# Patient Record
Sex: Female | Born: 1960 | Race: Black or African American | Hispanic: No | Marital: Single | Smoking: Never smoker
Health system: Southern US, Community
[De-identification: ages and names within clinical notes are randomized; demographics above are authoritative.]

---

## 2017-02-16 ENCOUNTER — Encounter (HOSPITAL_COMMUNITY): Payer: Self-pay | Admitting: Emergency Medicine

## 2017-02-16 ENCOUNTER — Inpatient Hospital Stay (HOSPITAL_COMMUNITY)
Admission: EM | Admit: 2017-02-16 | Discharge: 2017-03-13 | DRG: 377 | Disposition: A | Discharge status: Hospice | Payer: Self-pay | Attending: Pulmonary Disease | Admitting: Pulmonary Disease

## 2017-02-16 ENCOUNTER — Emergency Department (HOSPITAL_COMMUNITY): Payer: Self-pay

## 2017-02-16 DIAGNOSIS — Z66 Do not resuscitate: Secondary | ICD-10-CM | POA: Diagnosis present

## 2017-02-16 DIAGNOSIS — D259 Leiomyoma of uterus, unspecified: Secondary | ICD-10-CM | POA: Diagnosis present

## 2017-02-16 DIAGNOSIS — R05 Cough: Secondary | ICD-10-CM

## 2017-02-16 DIAGNOSIS — S065X9A Traumatic subdural hemorrhage with loss of consciousness of unspecified duration, initial encounter: Secondary | ICD-10-CM

## 2017-02-16 DIAGNOSIS — I130 Hypertensive heart and chronic kidney disease with heart failure and stage 1 through stage 4 chronic kidney disease, or unspecified chronic kidney disease: Secondary | ICD-10-CM | POA: Diagnosis present

## 2017-02-16 DIAGNOSIS — L8990 Pressure ulcer of unspecified site, unspecified stage: Secondary | ICD-10-CM | POA: Insufficient documentation

## 2017-02-16 DIAGNOSIS — T45515A Adverse effect of anticoagulants, initial encounter: Secondary | ICD-10-CM | POA: Diagnosis not present

## 2017-02-16 DIAGNOSIS — M25561 Pain in right knee: Secondary | ICD-10-CM

## 2017-02-16 DIAGNOSIS — D649 Anemia, unspecified: Secondary | ICD-10-CM

## 2017-02-16 DIAGNOSIS — R040 Epistaxis: Secondary | ICD-10-CM | POA: Diagnosis not present

## 2017-02-16 DIAGNOSIS — Z8249 Family history of ischemic heart disease and other diseases of the circulatory system: Secondary | ICD-10-CM

## 2017-02-16 DIAGNOSIS — K760 Fatty (change of) liver, not elsewhere classified: Secondary | ICD-10-CM | POA: Diagnosis present

## 2017-02-16 DIAGNOSIS — R7303 Prediabetes: Secondary | ICD-10-CM | POA: Diagnosis present

## 2017-02-16 DIAGNOSIS — R571 Hypovolemic shock: Secondary | ICD-10-CM | POA: Diagnosis present

## 2017-02-16 DIAGNOSIS — D62 Acute posthemorrhagic anemia: Secondary | ICD-10-CM | POA: Diagnosis present

## 2017-02-16 DIAGNOSIS — J9601 Acute respiratory failure with hypoxia: Secondary | ICD-10-CM | POA: Diagnosis present

## 2017-02-16 DIAGNOSIS — R531 Weakness: Secondary | ICD-10-CM

## 2017-02-16 DIAGNOSIS — I5041 Acute combined systolic (congestive) and diastolic (congestive) heart failure: Secondary | ICD-10-CM | POA: Diagnosis present

## 2017-02-16 DIAGNOSIS — E876 Hypokalemia: Secondary | ICD-10-CM | POA: Diagnosis not present

## 2017-02-16 DIAGNOSIS — I2781 Cor pulmonale (chronic): Secondary | ICD-10-CM | POA: Diagnosis present

## 2017-02-16 DIAGNOSIS — Z452 Encounter for adjustment and management of vascular access device: Secondary | ICD-10-CM

## 2017-02-16 DIAGNOSIS — I5043 Acute on chronic combined systolic (congestive) and diastolic (congestive) heart failure: Secondary | ICD-10-CM | POA: Diagnosis present

## 2017-02-16 DIAGNOSIS — N183 Chronic kidney disease, stage 3 (moderate): Secondary | ICD-10-CM | POA: Diagnosis present

## 2017-02-16 DIAGNOSIS — L89322 Pressure ulcer of left buttock, stage 2: Secondary | ICD-10-CM | POA: Diagnosis present

## 2017-02-16 DIAGNOSIS — N390 Urinary tract infection, site not specified: Secondary | ICD-10-CM | POA: Diagnosis not present

## 2017-02-16 DIAGNOSIS — L03312 Cellulitis of back [any part except buttock]: Secondary | ICD-10-CM | POA: Diagnosis present

## 2017-02-16 DIAGNOSIS — K210 Gastro-esophageal reflux disease with esophagitis: Secondary | ICD-10-CM | POA: Diagnosis present

## 2017-02-16 DIAGNOSIS — G935 Compression of brain: Secondary | ICD-10-CM | POA: Diagnosis not present

## 2017-02-16 DIAGNOSIS — K449 Diaphragmatic hernia without obstruction or gangrene: Secondary | ICD-10-CM | POA: Diagnosis present

## 2017-02-16 DIAGNOSIS — G4733 Obstructive sleep apnea (adult) (pediatric): Secondary | ICD-10-CM | POA: Diagnosis present

## 2017-02-16 DIAGNOSIS — Z6841 Body Mass Index (BMI) 40.0 and over, adult: Secondary | ICD-10-CM

## 2017-02-16 DIAGNOSIS — Z7189 Other specified counseling: Secondary | ICD-10-CM

## 2017-02-16 DIAGNOSIS — R570 Cardiogenic shock: Secondary | ICD-10-CM | POA: Diagnosis present

## 2017-02-16 DIAGNOSIS — N179 Acute kidney failure, unspecified: Secondary | ICD-10-CM

## 2017-02-16 DIAGNOSIS — L03115 Cellulitis of right lower limb: Secondary | ICD-10-CM | POA: Diagnosis present

## 2017-02-16 DIAGNOSIS — Z833 Family history of diabetes mellitus: Secondary | ICD-10-CM

## 2017-02-16 DIAGNOSIS — Z515 Encounter for palliative care: Secondary | ICD-10-CM | POA: Diagnosis not present

## 2017-02-16 DIAGNOSIS — E872 Acidosis: Secondary | ICD-10-CM | POA: Diagnosis present

## 2017-02-16 DIAGNOSIS — K922 Gastrointestinal hemorrhage, unspecified: Secondary | ICD-10-CM | POA: Diagnosis present

## 2017-02-16 DIAGNOSIS — R058 Other specified cough: Secondary | ICD-10-CM

## 2017-02-16 DIAGNOSIS — D72829 Elevated white blood cell count, unspecified: Secondary | ICD-10-CM

## 2017-02-16 DIAGNOSIS — R188 Other ascites: Secondary | ICD-10-CM

## 2017-02-16 DIAGNOSIS — J9600 Acute respiratory failure, unspecified whether with hypoxia or hypercapnia: Secondary | ICD-10-CM

## 2017-02-16 DIAGNOSIS — R4701 Aphasia: Secondary | ICD-10-CM | POA: Diagnosis not present

## 2017-02-16 DIAGNOSIS — E860 Dehydration: Secondary | ICD-10-CM | POA: Diagnosis present

## 2017-02-16 DIAGNOSIS — R519 Headache, unspecified: Secondary | ICD-10-CM

## 2017-02-16 DIAGNOSIS — D509 Iron deficiency anemia, unspecified: Secondary | ICD-10-CM | POA: Diagnosis present

## 2017-02-16 DIAGNOSIS — R4182 Altered mental status, unspecified: Secondary | ICD-10-CM

## 2017-02-16 DIAGNOSIS — K567 Ileus, unspecified: Secondary | ICD-10-CM | POA: Diagnosis not present

## 2017-02-16 DIAGNOSIS — W19XXXA Unspecified fall, initial encounter: Secondary | ICD-10-CM

## 2017-02-16 DIAGNOSIS — I4891 Unspecified atrial fibrillation: Secondary | ICD-10-CM | POA: Diagnosis present

## 2017-02-16 DIAGNOSIS — R739 Hyperglycemia, unspecified: Secondary | ICD-10-CM | POA: Diagnosis present

## 2017-02-16 DIAGNOSIS — R059 Cough, unspecified: Secondary | ICD-10-CM

## 2017-02-16 DIAGNOSIS — R51 Headache: Secondary | ICD-10-CM

## 2017-02-16 DIAGNOSIS — I429 Cardiomyopathy, unspecified: Secondary | ICD-10-CM | POA: Diagnosis present

## 2017-02-16 DIAGNOSIS — I6201 Nontraumatic acute subdural hemorrhage: Secondary | ICD-10-CM | POA: Diagnosis not present

## 2017-02-16 DIAGNOSIS — K648 Other hemorrhoids: Secondary | ICD-10-CM | POA: Diagnosis present

## 2017-02-16 DIAGNOSIS — I484 Atypical atrial flutter: Secondary | ICD-10-CM | POA: Diagnosis present

## 2017-02-16 DIAGNOSIS — E43 Unspecified severe protein-calorie malnutrition: Secondary | ICD-10-CM | POA: Diagnosis present

## 2017-02-16 DIAGNOSIS — Z79899 Other long term (current) drug therapy: Secondary | ICD-10-CM

## 2017-02-16 DIAGNOSIS — Y92009 Unspecified place in unspecified non-institutional (private) residence as the place of occurrence of the external cause: Secondary | ICD-10-CM

## 2017-02-16 DIAGNOSIS — S065XAA Traumatic subdural hemorrhage with loss of consciousness status unknown, initial encounter: Secondary | ICD-10-CM

## 2017-02-16 DIAGNOSIS — K2971 Gastritis, unspecified, with bleeding: Principal | ICD-10-CM | POA: Diagnosis present

## 2017-02-16 DIAGNOSIS — E8770 Fluid overload, unspecified: Secondary | ICD-10-CM | POA: Diagnosis present

## 2017-02-16 DIAGNOSIS — E039 Hypothyroidism, unspecified: Secondary | ICD-10-CM | POA: Diagnosis present

## 2017-02-16 DIAGNOSIS — Z9289 Personal history of other medical treatment: Secondary | ICD-10-CM

## 2017-02-16 DIAGNOSIS — N170 Acute kidney failure with tubular necrosis: Secondary | ICD-10-CM | POA: Diagnosis present

## 2017-02-16 DIAGNOSIS — I5082 Biventricular heart failure: Secondary | ICD-10-CM | POA: Diagnosis present

## 2017-02-16 DIAGNOSIS — I509 Heart failure, unspecified: Secondary | ICD-10-CM

## 2017-02-16 DIAGNOSIS — L89312 Pressure ulcer of right buttock, stage 2: Secondary | ICD-10-CM | POA: Diagnosis present

## 2017-02-16 LAB — CBC WITH DIFFERENTIAL/PLATELET
Basophils Absolute: 0 10*3/uL (ref 0.0–0.1)
Basophils Relative: 0 %
Eosinophils Absolute: 0 10*3/uL (ref 0.0–0.7)
Eosinophils Relative: 0 %
HCT: 14.2 % — ABNORMAL LOW (ref 36.0–46.0)
Hemoglobin: 3.7 g/dL — CL (ref 12.0–15.0)
Lymphocytes Relative: 8 %
Lymphs Abs: 1.5 10*3/uL (ref 0.7–4.0)
MCH: 15.9 pg — ABNORMAL LOW (ref 26.0–34.0)
MCHC: 26.1 g/dL — ABNORMAL LOW (ref 30.0–36.0)
MCV: 60.9 fL — ABNORMAL LOW (ref 78.0–100.0)
Monocytes Absolute: 1 10*3/uL (ref 0.1–1.0)
Monocytes Relative: 5 %
Neutro Abs: 16.6 10*3/uL — ABNORMAL HIGH (ref 1.7–7.7)
Neutrophils Relative %: 87 %
Platelets: 288 10*3/uL (ref 150–400)
RBC: 2.33 MIL/uL — ABNORMAL LOW (ref 3.87–5.11)
RDW: 25.9 % — ABNORMAL HIGH (ref 11.5–15.5)
WBC: 19.1 10*3/uL — ABNORMAL HIGH (ref 4.0–10.5)

## 2017-02-16 LAB — COMPREHENSIVE METABOLIC PANEL
ALT: 22 U/L (ref 14–54)
AST: 30 U/L (ref 15–41)
Albumin: 2.8 g/dL — ABNORMAL LOW (ref 3.5–5.0)
Alkaline Phosphatase: 105 U/L (ref 38–126)
Anion gap: 11 (ref 5–15)
BUN: 52 mg/dL — ABNORMAL HIGH (ref 6–20)
CO2: 17 mmol/L — ABNORMAL LOW (ref 22–32)
Calcium: 9.1 mg/dL (ref 8.9–10.3)
Chloride: 112 mmol/L — ABNORMAL HIGH (ref 101–111)
Creatinine, Ser: 1.96 mg/dL — ABNORMAL HIGH (ref 0.44–1.00)
GFR calc Af Amer: 32 mL/min — ABNORMAL LOW (ref >60)
GFR calc non Af Amer: 28 mL/min — ABNORMAL LOW (ref >60)
Glucose, Bld: 119 mg/dL — ABNORMAL HIGH (ref 65–99)
Potassium: 4.2 mmol/L (ref 3.5–5.1)
Sodium: 140 mmol/L (ref 135–145)
Total Bilirubin: 2.9 mg/dL — ABNORMAL HIGH (ref 0.3–1.2)
Total Protein: 6.2 g/dL — ABNORMAL LOW (ref 6.5–8.1)

## 2017-02-16 LAB — CBC
HCT: 14.6 % — ABNORMAL LOW (ref 36.0–46.0)
Hemoglobin: 3.8 g/dL — CL (ref 12.0–15.0)
MCH: 15.7 pg — ABNORMAL LOW (ref 26.0–34.0)
MCHC: 26 g/dL — ABNORMAL LOW (ref 30.0–36.0)
MCV: 60.3 fL — ABNORMAL LOW (ref 78.0–100.0)
PLATELETS: 300 10*3/uL (ref 150–400)
RBC: 2.42 MIL/uL — ABNORMAL LOW (ref 3.87–5.11)
RDW: 26 % — AB (ref 11.5–15.5)
WBC: 18.9 10*3/uL — AB (ref 4.0–10.5)

## 2017-02-16 LAB — MRSA PCR SCREENING: MRSA BY PCR: NEGATIVE

## 2017-02-16 LAB — POC OCCULT BLOOD, ED: Fecal Occult Bld: NEGATIVE

## 2017-02-16 LAB — APTT: aPTT: 35 seconds (ref 24–36)

## 2017-02-16 LAB — URINALYSIS, ROUTINE W REFLEX MICROSCOPIC
Bilirubin Urine: NEGATIVE
Glucose, UA: NEGATIVE mg/dL
Hgb urine dipstick: NEGATIVE
Ketones, ur: NEGATIVE mg/dL
Leukocytes, UA: NEGATIVE
Nitrite: NEGATIVE
Protein, ur: NEGATIVE mg/dL
Specific Gravity, Urine: 1.013 (ref 1.005–1.030)
pH: 5 (ref 5.0–8.0)

## 2017-02-16 LAB — CBG MONITORING, ED: Glucose-Capillary: 129 mg/dL — ABNORMAL HIGH (ref 65–99)

## 2017-02-16 LAB — ABO/RH: ABO/RH(D): O POS

## 2017-02-16 LAB — PREPARE RBC (CROSSMATCH)

## 2017-02-16 LAB — PREGNANCY, URINE: Preg Test, Ur: NEGATIVE

## 2017-02-16 LAB — GLUCOSE, CAPILLARY: GLUCOSE-CAPILLARY: 142 mg/dL — AB (ref 65–99)

## 2017-02-16 LAB — PROTIME-INR
INR: 1.4
Prothrombin Time: 17.2 seconds — ABNORMAL HIGH (ref 11.4–15.2)

## 2017-02-16 LAB — BRAIN NATRIURETIC PEPTIDE: B NATRIURETIC PEPTIDE 5: 923.6 pg/mL — AB (ref 0.0–100.0)

## 2017-02-16 LAB — I-STAT CG4 LACTIC ACID, ED: Lactic Acid, Venous: 2.46 mmol/L (ref 0.5–1.9)

## 2017-02-16 MED ORDER — SODIUM CHLORIDE 0.9 % IV BOLUS (SEPSIS): 1000.0000 mL | Freq: Once | INTRAVENOUS | Status: DC

## 2017-02-16 MED ORDER — SODIUM CHLORIDE 0.9 % IV BOLUS (SEPSIS): 500.0000 mL | Freq: Once | INTRAVENOUS | Status: DC

## 2017-02-16 MED ORDER — SODIUM CHLORIDE 0.9 % IV BOLUS (SEPSIS)
1000.0000 mL | Freq: Once | INTRAVENOUS | Status: AC
  Administered 2017-02-16: 1000 mL via INTRAVENOUS

## 2017-02-16 MED ORDER — SODIUM CHLORIDE 0.9 % IV SOLN: 1000.0000 mL | INTRAVENOUS | Status: DC

## 2017-02-16 MED ORDER — SODIUM CHLORIDE 0.9 % IV SOLN: Freq: Once | INTRAVENOUS | Status: DC

## 2017-02-16 MED ORDER — SODIUM CHLORIDE 0.9 % IV SOLN
250.0000 mL | INTRAVENOUS | Status: DC | PRN
  Administered 2017-03-09: 250 mL via INTRAVENOUS
  Administered 2017-03-11: 20 mL via INTRAVENOUS

## 2017-02-16 MED ORDER — ONDANSETRON HCL 4 MG/2ML IJ SOLN
4.0000 mg | Freq: Once | INTRAMUSCULAR | Status: AC
  Administered 2017-02-16: 4 mg via INTRAVENOUS
  Filled 2017-02-16 (×2): qty 2

## 2017-02-16 MED ORDER — FUROSEMIDE 10 MG/ML IJ SOLN: 40.0000 mg | Freq: Once | INTRAMUSCULAR | Status: DC

## 2017-02-16 MED ORDER — DEXTROSE 5 % IV SOLN
1.0000 g | Freq: Once | INTRAVENOUS | Status: AC
  Administered 2017-02-16: 1 g via INTRAVENOUS
  Filled 2017-02-16: qty 10

## 2017-02-16 MED ORDER — VANCOMYCIN HCL IN DEXTROSE 1-5 GM/200ML-% IV SOLN
1000.0000 mg | Freq: Once | INTRAVENOUS | Status: AC
  Administered 2017-02-16: 1000 mg via INTRAVENOUS
  Filled 2017-02-16: qty 200

## 2017-02-16 NOTE — ED Notes (Signed)
Pulse checks were completed by Dr. Vallery Ridge and [place]

## 2017-02-16 NOTE — ED Notes (Signed)
Unable to draw Lactic Acid repeat due to pt. Receiving blood at this time.

## 2017-02-16 NOTE — ED Triage Notes (Signed)
Patient here from home with complaints of weakness and pain all over. Incontinent of urine and bowel. Reports that she has not had anything to eat or drink in 3 days. States that he has been laying in the same spot for 3 days also. Sores all over.

## 2017-02-16 NOTE — H&P (Signed)
PULMONARY / CRITICAL CARE MEDICINE   Name: Samantha Richards MRN: [id number] DOB: 04-17-1961    ADMISSION DATE:  02/16/2017 CONSULTATION DATE:  02/16/2017  REFERRING MD:  Nils Flack, PA   CHIEF COMPLAINT:  Anemia   HISTORY OF PRESENT ILLNESS:   56 year old female with PMH of Obesity presents to ED on 6/24 with complaints of weakness and pain for the last 3 days. 2 weeks prior had a mechanical fall which resulted in right knee pain. Patient reports that she has not had anything to eat or drink as she has been laying in bed for 3 days unable to move, reports she lives with a roommate however they refused to help. EMS arrived to house on 6/21 and found patient laying face down on an air-mattress, however refused help. EMS reports that living situation was poor and house was in un-livable conditions.   Upon arrival to ED patient patient has multiple locations of skin breakdown along the buttocks, thighs, and legs. WBC 19.1, Hemoglobin 3.7, afebrile, BP 80/30. Rectal exam shows internal hemorrhoids, however is hemoccult negative. PCCM asked to admit.    PAST MEDICAL HISTORY :  Obesity   PAST SURGICAL HISTORY: She  has no past surgical history on file.  Allergies  Allergen Reactions  . Gentamicin Itching and Rash    No current facility-administered medications on file prior to encounter.    No current outpatient prescriptions on file prior to encounter.    FAMILY HISTORY:  Her has no family status information on file.    SOCIAL HISTORY: Lives in a house with roommate, poor housing conditions   REVIEW OF SYSTEMS:   All negative; except for those that are bolded, which indicate positives.  Constitutional: weight loss, weight gain, night sweats, fevers, chills, fatigue, weakness.  HEENT: headaches, sore throat, sneezing, nasal congestion, post nasal drip, difficulty swallowing, tooth/dental problems, visual complaints, visual changes, ear aches. Neuro: difficulty with speech, weakness,  numbness, ataxia. CV:  chest pain, orthopnea, PND, swelling in lower extremities, dizziness, palpitations, syncope.  Resp: cough, hemoptysis, dyspnea, wheezing. GI: heartburn, indigestion, abdominal pain, nausea, vomiting, diarrhea, constipation, change in bowel habits, loss of appetite, hematemesis, melena, hematochezia.  GU: dysuria, change in color of urine, urgency or frequency, flank pain, hematuria. MSK: joint pain or swelling, decreased range of motion. Psych: change in mood or affect, depression, anxiety, suicidal ideations, homicidal ideations. Skin: rash, itching, bruising.   SUBJECTIVE:  Denies pain, states swelling to lower extremities has worsened over the last few days   VITAL SIGNS: BP (!) 84/69   Pulse (!) 141   Temp 99 F (37.2 C)   Resp 20   LMP 01/16/2017   SpO2 100%   HEMODYNAMICS:    VENTILATOR SETTINGS:    INTAKE / OUTPUT: I/O last 3 completed shifts: In: 50 [Blood:335] Out: -   PHYSICAL EXAMINATION: General:  Adult female, mild respiratory distress  Neuro:  Alert, oriented, grossly intact  HEENT:  Dry MM Cardiovascular:  Tachy, no MRG Lungs:  Bibasilar Crackles, no wheeze, mild tachypnea   Abdomen:  Obese, active bowel sounds, non-tender  Musculoskeletal:  Swelling to lower extremities, warm to touch  Skin:  Dry, multiple locations of skin breakdown along the buttocks, thighs, and legs  LABS:  BMET  Recent Labs Lab 02/16/17 1421  NA 140  K 4.2  CL 112*  CO2 17*  BUN 52*  CREATININE 1.96*  GLUCOSE 119*    Electrolytes  Recent Labs Lab 02/16/17 1421  CALCIUM 9.1  CBC  Recent Labs Lab 02/16/17 1421 02/16/17 1516  WBC 19.1* 18.9*  HGB 3.7* 3.8*  HCT 14.2* 14.6*  PLT 288 300    Coag's  Recent Labs Lab 02/16/17 1421  APTT 35  INR 1.40    Sepsis Markers  Recent Labs Lab 02/16/17 1439  LATICACIDVEN 2.46*    ABG No results for input(s): PHART, PCO2ART, PO2ART in the last 168 hours.  Liver  Enzymes  Recent Labs Lab 02/16/17 1421  AST 30  ALT 22  ALKPHOS 105  BILITOT 2.9*  ALBUMIN 2.8*    Cardiac Enzymes No results for input(s): TROPONINI, PROBNP in the last 168 hours.  Glucose  Recent Labs Lab 02/16/17 1516  GLUCAP 129*    Imaging Dg Chest Port 1 View  Result Date: 02/16/2017 CLINICAL DATA:  Shortness of breath, possible fall 2 weeks ago. EXAM: PORTABLE CHEST 1 VIEW COMPARISON:  None. FINDINGS: Cardiomegaly, moderate two severe. Upper mediastinal contours, including aortic arch, difficult to characterize due to patient positioning. Central pulmonary vascular congestion and probable mild perihilar edema. No pneumothorax seen. No acute or suspicious osseous finding. IMPRESSION: 1. Cardiomegaly, moderate to severe degree. 2. Central pulmonary vascular congestion and probable mild perihilar interstitial edema suggesting mild CHF/volume overload. Electronically Signed   By: Franki Cabot M.D.   On: 02/16/2017 14:55   Dg Knee Complete 4 Views Right  Result Date: 02/16/2017 CLINICAL DATA:  Right knee pain, possible fall 2 weeks ago. EXAM: RIGHT KNEE - COMPLETE 4+ VIEW COMPARISON:  None. FINDINGS: No evidence of fracture, dislocation, or joint effusion. No evidence of arthropathy or other focal bone abnormality. Soft tissues are unremarkable. IMPRESSION: Negative. Electronically Signed   By: Franki Cabot M.D.   On: 02/16/2017 14:53     STUDIES:  CXR 6/24 > Cardiomegaly, moderate to severe degree, central vascular pulmonary congestion and probable mild perihilar interstitial edema suggesting mild CHF/volume overload  XR right Knee 6/24 > No Acute ECHO 6/24 >>  CULTURES: Blood 6/24 >>  ANTIBIOTICS: Vancomycin 6/24 in ED  Rocephin 6/24 >>  SIGNIFICANT EVENTS: 6/24 > Presents to ED   LINES/TUBES: PIV   DISCUSSION: 56 year old female presents to ED with progressive weakness, found to have hemoglobin of 3.7. Rectal exam revealed internal hemorrhoids with negative  hemoccult test. Systolic 96-28Z, CXR with pulmonary edema and cardiomegaly. GI consulted.   ASSESSMENT / PLAN:  PULMONARY A: Acute Hypoxic Respiratory Failure in setting of pulmonary edema  P:   BIPAP PRN Maintain oxygenation >92 Pulmonary Hygiene  Trend CXR   CARDIOVASCULAR A:  Hemorraghic Shock > no current signs of bleeding, believe this to be a slow bleed over the last few days Acute Heart Failure ? P:  ICU Monitoring  Maintain MAP > 65 (currently receiving RBCs)  RENAL A:   Acute Kidney Injury  Volume Overload  Lactic Acidosis  P:   Trend BMP Give 40 meq Lasix  Replace electrolytes as needed  Trend Lactic Acid  Trend CK   GASTROINTESTINAL A:   GI Bleed > internal hemorrhoids present  P:   GI consulted will see in AM Remain NPO PPI  HEMATOLOGIC A:   Anemia with acute bleed  P:  Trend CBC Maintain Hbg >7  INFECTIOUS A:   ?Cellulitis  P:   Trend WBC and Fever Curve  Continue Rocephin  Follow Culture Data   ENDOCRINE A:   No issues    P:   Trend Glucose   NEUROLOGIC A:   No issues  P:   Monitor    FAMILY  - Updates: no family presents   - Inter-disciplinary family meet or Palliative Care meeting due by:  02/23/2017    Pulmonary and [place] Pager: [phone]  02/16/2017, 7:54 PM

## 2017-02-16 NOTE — ED Triage Notes (Addendum)
Pt from home via EMS- Per pt, she sts that she has "been in the bed for 3 days without getting up". EMS reports that pt house conditions were "not livable". EMS reports that pt was lying in urine/feces in bed with stained sheets and clothes. Pt had a family member in the home, but reports that "he did not help me." Pt reports that she has had no food, fluids or meds.  Pt is A&O and in NAD.

## 2017-02-16 NOTE — ED Provider Notes (Signed)
[facility] DEPT Provider Note   CSN: [id number] Arrival date & time: 02/16/17  1301     History   Chief Complaint Chief Complaint  Patient presents with  . Weakness    HPI Samantha Richards is a 56 y.o. female with no apparent medical history who presents today with chief complaint acute onset generalized weakness for 3 days. She states that 2 weeks ago she fell on her right side resulting in right knee pain. She denies hitting her head or losing consciousness at this time. She states that she has been ambulatory since this time, however with worsening knee pain and stiffness. Per nursing, EMS was called 3 days ago to the patient's home and found her laying face down on an air mattress. They were able to move her upright, and patient refused medical evaluation at that time. She called EMS today stating that she has been unable to move out of the bed for 3 days and has been laying prone towards her left side. She states that she has been urinating and having bowel movements in this bed due to an inability to move out of bed. She states that she has not had anything to eat or drink for 3 days, but does state she lives with a roommate "who did not feed me because he was mad at me". She also endorses subjective fevers and chills, generalized edema to her abdomen and lower extremities, as well as painful areas of blistering and skin breakdown to her posterior legs bilaterally. She endorses mid sternal chest tightness and shortness of breath, which she thinks is due to her laying prone for 3 days. She endorses continued right knee pain and stiffness. She denies numbness, tingling, syncope.   She states she has no medical history but has not seen a medical provider for years, is a nonsmoker, and denies hemoptysis, OCPs or estrogen therapy, or history of prior DVT or PE.  The history is provided by the patient.    History reviewed. No pertinent past medical history.  Patient Active Problem List   Diagnosis Date Noted  . GI bleed 02/16/2017    History reviewed. No pertinent surgical history.  OB History    No data available       Home Medications    Prior to Admission medications   Not on File    Family History No family history on file.  Social History Social History  Substance Use Topics  . Smoking status: Not on file  . Smokeless tobacco: Not on file  . Alcohol use Not on file     Allergies   Gentamicin   Review of Systems Review of Systems  Constitutional: Positive for activity change, chills, fatigue and fever.  Respiratory: Positive for cough, chest tightness and shortness of breath.   Cardiovascular: Positive for leg swelling. Negative for chest pain.  Gastrointestinal: Positive for abdominal pain.  Genitourinary:       Functional incontinence for 3 days  Musculoskeletal: Negative for back pain, neck pain and neck stiffness.  Neurological: Positive for weakness. Negative for syncope and numbness.  All other systems reviewed and are negative.    Physical Exam Updated Vital Signs BP (!) 73/62 (BP Location: Left Arm)   Pulse (!) 135   Temp 98.5 F (36.9 C) (Oral)   Resp 20   LMP 01/16/2017   SpO2 98%   Physical Exam  Constitutional: She is oriented to person, place, and time. She appears distressed.  Anxious appearing, dirty fingernails and toenails  HENT:  Head: Normocephalic and atraumatic.  Right Ear: External ear normal.  Left Ear: External ear normal.  Fruity smelling breath, dry oral mucosa  Eyes: Conjunctivae and EOM are normal. Pupils are equal, round, and reactive to light. Right eye exhibits no discharge. Left eye exhibits no discharge.  Neck: Normal range of motion. Neck supple. No JVD present. No tracheal deviation present.  Cardiovascular: Intact distal pulses.   Tachycardic, 4+ pitting edema to bilateral lower extremities. 2+ radial pulses topical bilaterally. Pedal pulses present on Doppler. Feet are pink warm and dry  bilaterally with good cap refill of the toes.  Pulmonary/Chest:  Hyperventilating, no adventitious sounds heard in the anterior lung fields. There is parasternal chest wall tenderness superiorly, however no lateral chest wall tenderness to palpation. Left breast is edematous compared to the right and nontender to palpation.   Abdominal: She exhibits distension. There is no tenderness. There is no guarding.  Hypoactive bowel sounds, no tenderness to palpation, however there is generalized edema to the abdomen, left worse than right  Genitourinary:  Genitourinary Comments: Examination performed in the presence of a chaperone. No frank bleeding, fissures, or tears noticed at the rectum. No masses or lesions. Internal hemorrhoid palpated. No surrounding erythema or drainage.  Musculoskeletal: She exhibits edema and tenderness.  Peripheral muscle wasting in all 4 extremities. Limited range of motion of BUE and BLE due to weakness, worse in the right lower extremity. 4/5 strength in BUE and BLE major muscle groups, save for the right hip flexors, right gastrocs, and BL deltoids which are 3/5. Good grip strength.   No midline spine TTP, no paraspinal muscle tenderness. No deformity, crepitus, or step-off noted.   Right knee tender to palpation along the lateral joint line and patella, limited range of motion, no erythema. There is apparent swelling. No varus or valgus deformity noted.   Neurological: She is alert and oriented to person, place, and time. No cranial nerve deficit or sensory deficit.  Skin: Skin is warm and dry.  See attached photos. There appears to be superficial skin breakdown along the posterior bilateral legs towards the buttocks and thighs. These areas are tender to palpation. There are also bulla seen to the right distal lower leg posteriorly and weeping sores to the left distal lower leg posteriorly. Right anterior thigh with mild erythema  Psychiatric: She has a normal mood and  affect. Her behavior is normal.           ED Treatments / Results  Labs (all labs ordered are listed, but only abnormal results are displayed) Labs Reviewed  CBC WITH DIFFERENTIAL/PLATELET - Abnormal; Notable for the following:       Result Value   WBC 19.1 (*)    RBC 2.33 (*)    Hemoglobin 3.7 (*)    HCT 14.2 (*)    MCV 60.9 (*)    MCH 15.9 (*)    MCHC 26.1 (*)    RDW 25.9 (*)    Neutro Abs 16.6 (*)    All other components within normal limits  COMPREHENSIVE METABOLIC PANEL - Abnormal; Notable for the following:    Chloride 112 (*)    CO2 17 (*)    Glucose, Bld 119 (*)    BUN 52 (*)    Creatinine, Ser 1.96 (*)    Total Protein 6.2 (*)    Albumin 2.8 (*)    Total Bilirubin 2.9 (*)    GFR calc non Af Amer 28 (*)    GFR  calc Af Amer 32 (*)    All other components within normal limits  PROTIME-INR - Abnormal; Notable for the following:    Prothrombin Time 17.2 (*)    All other components within normal limits  CBC - Abnormal; Notable for the following:    WBC 18.9 (*)    RBC 2.42 (*)    Hemoglobin 3.8 (*)    HCT 14.6 (*)    MCV 60.3 (*)    MCH 15.7 (*)    MCHC 26.0 (*)    RDW 26.0 (*)    All other components within normal limits  BRAIN NATRIURETIC PEPTIDE - Abnormal; Notable for the following:    B Natriuretic Peptide 923.6 (*)    All other components within normal limits  I-STAT CG4 LACTIC ACID, ED - Abnormal; Notable for the following:    Lactic Acid, Venous 2.46 (*)    All other components within normal limits  CBG MONITORING, ED - Abnormal; Notable for the following:    Glucose-Capillary 129 (*)    All other components within normal limits  CULTURE, BLOOD (ROUTINE X 2)  CULTURE, BLOOD (ROUTINE X 2)  APTT  URINALYSIS, ROUTINE W REFLEX MICROSCOPIC  PREGNANCY, URINE  CK  BASIC METABOLIC PANEL  MAGNESIUM  PHOSPHORUS  CBC  CBC  CBC  BASIC METABOLIC PANEL  TROPONIN I  TROPONIN I  TROPONIN I  POC OCCULT BLOOD, ED  I-STAT CG4 LACTIC ACID, ED  TYPE  AND SCREEN  PREPARE RBC (CROSSMATCH)  ABO/RH    EKG  EKG Interpretation  Date/Time:  Sunday February 16 2017 13:47:30 EDT Ventricular Rate:  176 PR Interval:    QRS Duration: 124 QT Interval:  336 QTC Calculation: 575 R Axis:   -44 Text Interpretation:  very low voltage. tachycardia.  possible atrial fibrillation. no ST elevation . no old comparison Confirmed by Charlesetta Shanks [id number]) on 02/16/2017 4:45:33 PM       Radiology Dg Chest Port 1 View  Result Date: 02/16/2017 CLINICAL DATA:  Shortness of breath, possible fall 2 weeks ago. EXAM: PORTABLE CHEST 1 VIEW COMPARISON:  None. FINDINGS: Cardiomegaly, moderate two severe. Upper mediastinal contours, including aortic arch, difficult to characterize due to patient positioning. Central pulmonary vascular congestion and probable mild perihilar edema. No pneumothorax seen. No acute or suspicious osseous finding. IMPRESSION: 1. Cardiomegaly, moderate to severe degree. 2. Central pulmonary vascular congestion and probable mild perihilar interstitial edema suggesting mild CHF/volume overload. Electronically Signed   By: Franki Cabot M.D.   On: 02/16/2017 14:55   Dg Knee Complete 4 Views Right  Result Date: 02/16/2017 CLINICAL DATA:  Right knee pain, possible fall 2 weeks ago. EXAM: RIGHT KNEE - COMPLETE 4+ VIEW COMPARISON:  None. FINDINGS: No evidence of fracture, dislocation, or joint effusion. No evidence of arthropathy or other focal bone abnormality. Soft tissues are unremarkable. IMPRESSION: Negative. Electronically Signed   By: Franki Cabot M.D.   On: 02/16/2017 14:53    Procedures Procedures (including critical care time)  Medications Ordered in ED Medications  sodium chloride 0.9 % bolus 1,000 mL (1,000 mLs Intravenous Not Given 02/16/17 1518)    Followed by  0.9 %  sodium chloride infusion (0 mLs Intravenous Hold 02/16/17 1518)  0.9 %  sodium chloride infusion ( Intravenous Not Given 02/16/17 1520)  0.9 %  sodium chloride infusion  (not administered)  furosemide (LASIX) injection 40 mg (not administered)  sodium chloride 0.9 % bolus 1,000 mL (0 mLs Intravenous Stopped 02/16/17 1517)  cefTRIAXone (ROCEPHIN) 1  g in dextrose 5 % 50 mL IVPB (0 g Intravenous Stopped 02/16/17 1517)  vancomycin (VANCOCIN) IVPB 1000 mg/200 mL premix (0 mg Intravenous Stopped 02/16/17 1702)  ondansetron (ZOFRAN) injection 4 mg (4 mg Intravenous Given 02/16/17 1632)     Initial Impression / Assessment and Plan / ED Course  I have reviewed the triage vital signs and the nursing notes.  Pertinent labs & imaging results that were available during my care of the patient were reviewed by me and considered in my medical decision making (see chart for details).     Patient with generalized weakness who spent 3 days prone in bed secondary to worsening right knee pain and associated generalized dependent edema, reproducible chest pain, and SOB. Hypotensive and tachycardic while in the ED. Lactate  2.46; initiated fluid resuscitation. Leukocytosis of 19.1 with hemoglobin of 3.7, confirmed with redraw. Hemoccult-negative, internal hemorrhoids palpated on examination. Decreased fluid resuscitation and initiated blood transfusion. EKG shows low voltage and tachycardia. CXR shows cardiomegaly(moderate to severe) and central pulmonary vascular congestion and probable mild perihilar interstitial edema suggesting mild CHF/volume overload. Moderate increase in BNP >900. Possible component of congestive heart failure, low suspicion of ACS or MI. Multiple areas of skin breakdown with possible concern for cellulitis, initiated broad-spectrum antibiotics. Spoke with intensivist, who agrees to assume care of patient will be brought into the hospital for further management and observation of her anemia and possible infection. Patient seen and evaluated by Dr. Johnney Killian who agrees with assessment and plan.  CRITICAL CARE Performed by: Renita Papa   Total critical care time:  >30 minutes  Critical care time was exclusive of separately billable procedures and treating other patients.  Critical care was necessary to treat or prevent imminent or life-threatening deterioration.  Critical care was time spent personally by me on the following activities: development of treatment plan with patient and/or surrogate as well as nursing, discussions with consultants, evaluation of patient's response to treatment, examination of patient, obtaining history from patient or surrogate, ordering and performing treatments and interventions, ordering and review of laboratory studies, ordering and review of radiographic studies, pulse oximetry and re-evaluation of patient's condition.  Final Clinical Impressions(s) / ED Diagnoses   Final diagnoses:  Fall    New Prescriptions New Prescriptions   No medications on file     Debroah Baller 02/16/17 2300    Charlesetta Shanks, MD 03/01/17 1520

## 2017-02-17 ENCOUNTER — Inpatient Hospital Stay (HOSPITAL_COMMUNITY): Payer: Self-pay

## 2017-02-17 ENCOUNTER — Encounter (HOSPITAL_COMMUNITY): Payer: Self-pay | Admitting: Radiology

## 2017-02-17 DIAGNOSIS — M79609 Pain in unspecified limb: Secondary | ICD-10-CM

## 2017-02-17 DIAGNOSIS — I360 Nonrheumatic tricuspid (valve) stenosis: Secondary | ICD-10-CM

## 2017-02-17 DIAGNOSIS — M7989 Other specified soft tissue disorders: Secondary | ICD-10-CM

## 2017-02-17 LAB — BLOOD CULTURE ID PANEL (REFLEXED)
Acinetobacter baumannii: NOT DETECTED
CANDIDA GLABRATA: NOT DETECTED
CANDIDA TROPICALIS: NOT DETECTED
Candida albicans: NOT DETECTED
Candida krusei: NOT DETECTED
Candida parapsilosis: NOT DETECTED
ENTEROBACTER CLOACAE COMPLEX: NOT DETECTED
ENTEROBACTERIACEAE SPECIES: NOT DETECTED
ENTEROCOCCUS SPECIES: NOT DETECTED
Escherichia coli: NOT DETECTED
HAEMOPHILUS INFLUENZAE: NOT DETECTED
Klebsiella oxytoca: NOT DETECTED
Klebsiella pneumoniae: NOT DETECTED
Listeria monocytogenes: NOT DETECTED
METHICILLIN RESISTANCE: DETECTED — AB
NEISSERIA MENINGITIDIS: NOT DETECTED
PROTEUS SPECIES: NOT DETECTED
Pseudomonas aeruginosa: NOT DETECTED
STAPHYLOCOCCUS SPECIES: DETECTED — AB
STREPTOCOCCUS AGALACTIAE: NOT DETECTED
STREPTOCOCCUS SPECIES: NOT DETECTED
Serratia marcescens: NOT DETECTED
Staphylococcus aureus (BCID): NOT DETECTED
Streptococcus pneumoniae: NOT DETECTED
Streptococcus pyogenes: NOT DETECTED

## 2017-02-17 LAB — GLUCOSE, CAPILLARY
GLUCOSE-CAPILLARY: 154 mg/dL — AB (ref 65–99)
Glucose-Capillary: 125 mg/dL — ABNORMAL HIGH (ref 65–99)
Glucose-Capillary: 147 mg/dL — ABNORMAL HIGH (ref 65–99)
Glucose-Capillary: 149 mg/dL — ABNORMAL HIGH (ref 65–99)
Glucose-Capillary: 156 mg/dL — ABNORMAL HIGH (ref 65–99)

## 2017-02-17 LAB — BASIC METABOLIC PANEL
Anion gap: 12 (ref 5–15)
Anion gap: 8 (ref 5–15)
BUN: 59 mg/dL — AB (ref 6–20)
BUN: 62 mg/dL — ABNORMAL HIGH (ref 6–20)
CHLORIDE: 113 mmol/L — AB (ref 101–111)
CHLORIDE: 114 mmol/L — AB (ref 101–111)
CO2: 17 mmol/L — ABNORMAL LOW (ref 22–32)
CO2: 18 mmol/L — ABNORMAL LOW (ref 22–32)
CREATININE: 2.05 mg/dL — AB (ref 0.44–1.00)
Calcium: 9 mg/dL (ref 8.9–10.3)
Calcium: 8.7 mg/dL — ABNORMAL LOW (ref 8.9–10.3)
Creatinine, Ser: 1.96 mg/dL — ABNORMAL HIGH (ref 0.44–1.00)
GFR calc Af Amer: 32 mL/min — ABNORMAL LOW (ref >60)
GFR calc non Af Amer: 28 mL/min — ABNORMAL LOW (ref >60)
GFR, EST AFRICAN AMERICAN: 31 mL/min — AB (ref >60)
GFR, EST NON AFRICAN AMERICAN: 27 mL/min — AB (ref >60)
GLUCOSE: 141 mg/dL — AB (ref 65–99)
Glucose, Bld: 161 mg/dL — ABNORMAL HIGH (ref 65–99)
POTASSIUM: 4.2 mmol/L (ref 3.5–5.1)
POTASSIUM: 4 mmol/L (ref 3.5–5.1)
SODIUM: 140 mmol/L (ref 135–145)
Sodium: 142 mmol/L (ref 135–145)

## 2017-02-17 LAB — ECHOCARDIOGRAM COMPLETE
AVLVOTPG: 14 mmHg
Ao-asc: 25 cm
CHL CUP MV DEC (S): 151
CHL CUP MV M VEL: 283
CHL CUP RV SYS PRESS: 27 mmHg
E/e' ratio: 10.89
EWDT: 151 ms
FS: 19 % — AB (ref 28–44)
Height: 65 in
IV/PV OW: 0.69
LA diam end sys: 38 mm
LA diam index: 1.67 cm/m2
LA vol A4C: 64.9 ml
LASIZE: 38 mm
LV TDI E'MEDIAL: 9.68
LV e' LATERAL: 10.1 cm/s
LV sys vol index: 29 mL/m2
LV sys vol: 66 mL — AB (ref 14–42)
LVDIAVOL: 106 mL (ref 46–106)
LVDIAVOLIN: 47 mL/m2
LVEEAVG: 10.89
LVEEMED: 10.89
LVOT VTI: 23.4 cm
LVOTPV: 185 cm/s
Lateral S' vel: 6.64 cm/s
MV Annulus VTI: 90.6 cm
MV Peak grad: 5 mmHg
MVG: 34 mmHg
MVPKEVEL: 110 m/s
PW: 14.3 mm — AB (ref 0.6–1.1)
Reg peak vel: 176 cm/s
Simpson's disk: 38
Stroke v: 40 ml
TAPSE: 9.66 mm
TDI e' lateral: 10.1
TR max vel: 176 cm/s
Weight: 3802.49 oz

## 2017-02-17 LAB — PHOSPHORUS: Phosphorus: 4.4 mg/dL (ref 2.5–4.6)

## 2017-02-17 LAB — CBC
HCT: 19.9 % — ABNORMAL LOW (ref 36.0–46.0)
HCT: 21.6 % — ABNORMAL LOW (ref 36.0–46.0)
HCT: 21.3 % — ABNORMAL LOW (ref 36.0–46.0)
HCT: 21.5 % — ABNORMAL LOW (ref 36.0–46.0)
HEMOGLOBIN: 6 g/dL — AB (ref 12.0–15.0)
Hemoglobin: 6.7 g/dL — CL (ref 12.0–15.0)
Hemoglobin: 6.4 g/dL — CL (ref 12.0–15.0)
Hemoglobin: 6.8 g/dL — CL (ref 12.0–15.0)
MCH: 21.1 pg — AB (ref 26.0–34.0)
MCH: 22.1 pg — AB (ref 26.0–34.0)
MCH: 21.1 pg — ABNORMAL LOW (ref 26.0–34.0)
MCH: 23 pg — AB (ref 26.0–34.0)
MCHC: 30.2 g/dL (ref 30.0–36.0)
MCHC: 31 g/dL (ref 30.0–36.0)
MCHC: 30 g/dL (ref 30.0–36.0)
MCHC: 31.6 g/dL (ref 30.0–36.0)
MCV: 69.8 fL — AB (ref 78.0–100.0)
MCV: 71.3 fL — AB (ref 78.0–100.0)
MCV: 70.1 fL — ABNORMAL LOW (ref 78.0–100.0)
MCV: 72.6 fL — ABNORMAL LOW (ref 78.0–100.0)
Platelets: 210 10*3/uL (ref 150–400)
Platelets: 201 10*3/uL (ref 150–400)
Platelets: 227 10*3/uL (ref 150–400)
Platelets: 180 10*3/uL (ref 150–400)
RBC: 2.85 MIL/uL — AB (ref 3.87–5.11)
RBC: 3.03 MIL/uL — ABNORMAL LOW (ref 3.87–5.11)
RBC: 3.04 MIL/uL — ABNORMAL LOW (ref 3.87–5.11)
RBC: 2.96 MIL/uL — AB (ref 3.87–5.11)
RDW: 30.2 % — ABNORMAL HIGH (ref 11.5–15.5)
RDW: 30.1 % — AB (ref 11.5–15.5)
RDW: 29.3 % — AB (ref 11.5–15.5)
RDW: 29.2 % — AB (ref 11.5–15.5)
WBC: 20.5 10*3/uL — ABNORMAL HIGH (ref 4.0–10.5)
WBC: 19.5 10*3/uL — ABNORMAL HIGH (ref 4.0–10.5)
WBC: 20.2 10*3/uL — ABNORMAL HIGH (ref 4.0–10.5)
WBC: 18.5 10*3/uL — AB (ref 4.0–10.5)

## 2017-02-17 LAB — TROPONIN I: Troponin I: <0.03 ng/mL (ref <0.03)

## 2017-02-17 LAB — SAVE SMEAR

## 2017-02-17 LAB — PREPARE RBC (CROSSMATCH)

## 2017-02-17 LAB — LACTIC ACID, PLASMA: Lactic Acid, Venous: 1.4 mmol/L (ref 0.5–1.9)

## 2017-02-17 LAB — CORTISOL: Cortisol, Plasma: 25.8 ug/dL

## 2017-02-17 LAB — MAGNESIUM: Magnesium: 2.1 mg/dL (ref 1.7–2.4)

## 2017-02-17 MED ORDER — GUAIFENESIN-DM 100-10 MG/5ML PO SYRP
5.0000 mL | ORAL_SOLUTION | ORAL | Status: DC | PRN
  Administered 2017-02-17 – 2017-02-23 (×2): 5 mL via ORAL
  Filled 2017-02-17 (×2): qty 10

## 2017-02-17 MED ORDER — DOBUTAMINE IN D5W 4-5 MG/ML-% IV SOLN
2.5000 ug/kg/min | INTRAVENOUS | Status: DC
  Administered 2017-02-17: 2.5 ug/kg/min via INTRAVENOUS
  Filled 2017-02-17 (×2): qty 250

## 2017-02-17 MED ORDER — SODIUM CHLORIDE 0.9 % IV SOLN
Freq: Once | INTRAVENOUS | Status: AC
  Administered 2017-02-17: 23:00:00 via INTRAVENOUS

## 2017-02-17 MED ORDER — VANCOMYCIN HCL 10 G IV SOLR
1250.0000 mg | INTRAVENOUS | Status: DC
  Administered 2017-02-18 – 2017-02-19 (×2): 1250 mg via INTRAVENOUS
  Filled 2017-02-17 (×3): qty 1250

## 2017-02-17 MED ORDER — ONDANSETRON HCL 4 MG/2ML IJ SOLN
4.0000 mg | Freq: Four times a day (QID) | INTRAMUSCULAR | Status: DC | PRN
  Administered 2017-02-17 – 2017-03-13 (×25): 4 mg via INTRAVENOUS
  Filled 2017-02-17 (×25): qty 2

## 2017-02-17 MED ORDER — ADULT MULTIVITAMIN W/MINERALS CH
1.0000 | ORAL_TABLET | Freq: Every day | ORAL | Status: DC
  Administered 2017-02-18 – 2017-03-03 (×11): 1 via ORAL
  Filled 2017-02-17 (×13): qty 1

## 2017-02-17 MED ORDER — LIP MEDEX EX OINT
TOPICAL_OINTMENT | CUTANEOUS | Status: AC
  Administered 2017-02-17: 09:00:00
  Filled 2017-02-17: qty 7

## 2017-02-17 MED ORDER — PHENYLEPHRINE HCL-NACL 10-0.9 MG/250ML-% IV SOLN
0.0000 ug/min | INTRAVENOUS | Status: DC
  Administered 2017-02-17: 20 ug/min via INTRAVENOUS
  Filled 2017-02-17: qty 250

## 2017-02-17 MED ORDER — INSULIN ASPART 100 UNIT/ML ~~LOC~~ SOLN
0.0000 [IU] | SUBCUTANEOUS | Status: DC
  Administered 2017-02-17: 2 [IU] via SUBCUTANEOUS
  Administered 2017-02-17: 3 [IU] via SUBCUTANEOUS
  Administered 2017-02-17: 2 [IU] via SUBCUTANEOUS
  Administered 2017-02-20: 5 [IU] via SUBCUTANEOUS
  Administered 2017-02-21: 3 [IU] via SUBCUTANEOUS
  Administered 2017-02-21 – 2017-02-22 (×4): 2 [IU] via SUBCUTANEOUS
  Administered 2017-02-22 (×2): 3 [IU] via SUBCUTANEOUS
  Administered 2017-02-23 – 2017-02-24 (×4): 2 [IU] via SUBCUTANEOUS
  Administered 2017-02-24 – 2017-02-25 (×2): 3 [IU] via SUBCUTANEOUS
  Administered 2017-02-25: 2 [IU] via SUBCUTANEOUS
  Administered 2017-02-26: 3 [IU] via SUBCUTANEOUS
  Administered 2017-02-27 – 2017-03-01 (×3): 2 [IU] via SUBCUTANEOUS

## 2017-02-17 MED ORDER — SODIUM CHLORIDE 0.9 % IV BOLUS (SEPSIS)
500.0000 mL | Freq: Once | INTRAVENOUS | Status: AC
  Administered 2017-02-17: 500 mL via INTRAVENOUS

## 2017-02-17 MED ORDER — ORAL CARE MOUTH RINSE
15.0000 mL | Freq: Two times a day (BID) | OROMUCOSAL | Status: DC
  Administered 2017-02-17 – 2017-03-01 (×20): 15 mL via OROMUCOSAL

## 2017-02-17 MED ORDER — AMIODARONE HCL IN DEXTROSE 360-4.14 MG/200ML-% IV SOLN
30.0000 mg/h | INTRAVENOUS | Status: DC
  Administered 2017-02-18 – 2017-03-13 (×44): 30 mg/h via INTRAVENOUS
  Filled 2017-02-17 (×47): qty 200

## 2017-02-17 MED ORDER — AMIODARONE LOAD VIA INFUSION
150.0000 mg | Freq: Once | INTRAVENOUS | Status: AC
  Administered 2017-02-17: 150 mg via INTRAVENOUS
  Filled 2017-02-17: qty 83.34

## 2017-02-17 MED ORDER — PANTOPRAZOLE SODIUM 40 MG IV SOLR
40.0000 mg | Freq: Two times a day (BID) | INTRAVENOUS | Status: DC
  Administered 2017-02-17: 40 mg via INTRAVENOUS
  Filled 2017-02-17: qty 40

## 2017-02-17 MED ORDER — SODIUM CHLORIDE 0.9 % IV SOLN
8.0000 mg/h | INTRAVENOUS | Status: AC
  Administered 2017-02-17 – 2017-02-19 (×6): 8 mg/h via INTRAVENOUS
  Filled 2017-02-17 (×11): qty 80

## 2017-02-17 MED ORDER — SODIUM CHLORIDE 0.9 % IV SOLN
80.0000 mg | Freq: Once | INTRAVENOUS | Status: AC
  Administered 2017-02-17: 12:00:00 80 mg via INTRAVENOUS
  Filled 2017-02-17: qty 80

## 2017-02-17 MED ORDER — IOPAMIDOL (ISOVUE-300) INJECTION 61%: 15.0000 mL | Freq: Two times a day (BID) | INTRAVENOUS | Status: DC | PRN

## 2017-02-17 MED ORDER — LIP MEDEX EX OINT
TOPICAL_OINTMENT | CUTANEOUS | Status: DC | PRN
  Filled 2017-02-17: qty 7

## 2017-02-17 MED ORDER — IOPAMIDOL (ISOVUE-300) INJECTION 61%
INTRAVENOUS | Status: AC
  Filled 2017-02-17: qty 30

## 2017-02-17 MED ORDER — PHENYLEPHRINE HCL-NACL 10-0.9 MG/250ML-% IV SOLN
0.0000 ug/min | INTRAVENOUS | Status: DC
  Administered 2017-02-17: 20 ug/min via INTRAVENOUS
  Administered 2017-02-18: 45 ug/min via INTRAVENOUS
  Administered 2017-02-18: 60 ug/min via INTRAVENOUS
  Administered 2017-02-18: 45 ug/min via INTRAVENOUS
  Filled 2017-02-17 (×4): qty 250

## 2017-02-17 MED ORDER — CEFTRIAXONE SODIUM 2 G IJ SOLR
2.0000 g | INTRAMUSCULAR | Status: DC
  Administered 2017-02-17 – 2017-02-24 (×8): 2 g via INTRAVENOUS
  Filled 2017-02-17 (×9): qty 2

## 2017-02-17 MED ORDER — PROMETHAZINE HCL 25 MG RE SUPP
25.0000 mg | Freq: Four times a day (QID) | RECTAL | Status: DC | PRN
  Filled 2017-02-17: qty 1

## 2017-02-17 MED ORDER — SODIUM CHLORIDE 0.9 % IV SOLN
Freq: Once | INTRAVENOUS | Status: AC
  Administered 2017-02-17: 16:00:00 via INTRAVENOUS

## 2017-02-17 MED ORDER — VANCOMYCIN HCL 10 G IV SOLR
2000.0000 mg | Freq: Once | INTRAVENOUS | Status: AC
  Administered 2017-02-17: 2000 mg via INTRAVENOUS
  Filled 2017-02-17: qty 2000

## 2017-02-17 MED ORDER — SODIUM CHLORIDE 0.9 % IV SOLN
Freq: Once | INTRAVENOUS | Status: AC
  Administered 2017-02-17: 04:00:00 via INTRAVENOUS

## 2017-02-17 MED ORDER — AMIODARONE HCL IN DEXTROSE 360-4.14 MG/200ML-% IV SOLN
60.0000 mg/h | INTRAVENOUS | Status: DC
  Administered 2017-02-17: 60 mg/h via INTRAVENOUS
  Filled 2017-02-17 (×2): qty 200

## 2017-02-17 NOTE — Progress Notes (Signed)
PHARMACY NOTE -  ANTIBIOTIC RENAL DOSE ADJUSTMENT   Request received for Pharmacy to assist with antibiotic renal dose adjustment.  Patient has been initiated on Ceftriaxone 2gm iv q24hr for Cellulitis. SCr 1.96, estimated CrCl ~40 ml/min Current dosage is appropriate and need for further dosage adjustment appears unlikely at present. Will sign off at this time.  Please reconsult if a change in clinical status warrants re-evaluation of dosage.

## 2017-02-17 NOTE — Progress Notes (Signed)
   02/17/17 1300  Clinical Encounter Type  Visited With Patient  Visit Type Initial;Psychological support;Spiritual support;Critical Care  Referral From Nurse  Consult/Referral To Chaplain  Spiritual Encounters  Spiritual Needs Prayer;Other (Comment) (Pastoral Conversation/Support)  Stress Factors  Patient Stress Factors Not reviewed   I visited briefly with the patient per [place] for prayer. The patient was in a great deal of discomfort upon my arrival and asked if I would come back at a later time.   Please, contact Spiritual Care for further assistance.   [place] M.Div.

## 2017-02-17 NOTE — Progress Notes (Signed)
Patient vomited small amount (appx 5cc) of bright red blood. Dr. Alessandra Bevels notified. Orders received. Will continue to monitor. S.Odas Ozer, RN

## 2017-02-17 NOTE — Progress Notes (Signed)
EKG reviewed with Cardiology (pending evaluation) > feel is likely A-Flutter.  ECHO results noted with septal flattening, LVEF 30-35%.  Admitted with GIB, weakness / Hgb 3 > concern for profound anemia vs ? Underlying malignancy with PE? given constellation of symptoms.   Plan: Assess STAT LE doppler Repeat BMP, peripheral smear Consider DIC, LDH panel in am (? Validity given 5 units PRBC 6/25) Continue supportive measures > no acute interventions for AF at present Change neosynephrine to dobutamine  Samantha Gens, NP-C  Pulmonary & Critical Care Pgr: [phone] or if no answer [phone] 02/17/2017, 3:05 PM

## 2017-02-17 NOTE — Progress Notes (Signed)
**  Preliminary report by tech**  Bilateral lower extremity venous duplex completed. There is no obvious evidence of deep or superficial vein thrombosis involving the right and left lower extremities. All clearly visualized vessels appear patent and compressible. There is no evidence of Baker's cysts bilaterally. Results were given to the patient's nurse, Emeterio Reeve.  02/17/17 5:04 PM Samantha Richards RVT

## 2017-02-17 NOTE — Progress Notes (Signed)
PHARMACY - PHYSICIAN COMMUNICATION CRITICAL VALUE ALERT - BLOOD CULTURE IDENTIFICATION (BCID)  Results for orders placed or performed during the hospital encounter of 02/16/17  Blood Culture ID Panel (Reflexed) (Collected: 02/16/2017  2:21 PM)  Result Value Ref Range   Enterococcus species NOT DETECTED NOT DETECTED   Listeria monocytogenes NOT DETECTED NOT DETECTED   Staphylococcus species DETECTED (A) NOT DETECTED   Staphylococcus aureus NOT DETECTED NOT DETECTED   Methicillin resistance DETECTED (A) NOT DETECTED   Streptococcus species NOT DETECTED NOT DETECTED   Streptococcus agalactiae NOT DETECTED NOT DETECTED   Streptococcus pneumoniae NOT DETECTED NOT DETECTED   Streptococcus pyogenes NOT DETECTED NOT DETECTED   Acinetobacter baumannii NOT DETECTED NOT DETECTED   Enterobacteriaceae species NOT DETECTED NOT DETECTED   Enterobacter cloacae complex NOT DETECTED NOT DETECTED   Escherichia coli NOT DETECTED NOT DETECTED   Klebsiella oxytoca NOT DETECTED NOT DETECTED   Klebsiella pneumoniae NOT DETECTED NOT DETECTED   Proteus species NOT DETECTED NOT DETECTED   Serratia marcescens NOT DETECTED NOT DETECTED   Haemophilus influenzae NOT DETECTED NOT DETECTED   Neisseria meningitidis NOT DETECTED NOT DETECTED   Pseudomonas aeruginosa NOT DETECTED NOT DETECTED   Candida albicans NOT DETECTED NOT DETECTED   Candida glabrata NOT DETECTED NOT DETECTED   Candida krusei NOT DETECTED NOT DETECTED   Candida parapsilosis NOT DETECTED NOT DETECTED   Candida tropicalis NOT DETECTED NOT DETECTED    Name of physician (or Provider) Contacted: Dr Vaughan Browner  Changes to prescribed antibiotics required: no changes at this time.  Continue Vancomycin and Ceftriaxone.  Everette Rank, PharmD 02/17/2017  5:20 PM

## 2017-02-17 NOTE — Progress Notes (Signed)
[facility] Progress Note Patient Name: Samantha Richards DOB: 01-01-1961 MRN: [id number]   Date of Service  02/17/2017  HPI/Events of Note  CBC Latest Ref Rng & Units 02/17/2017 02/17/2017 02/17/2017  WBC 4.0 - 10.5 K/uL 18.5(H) 20.2(H) 19.5(H)  Hemoglobin 12.0 - 15.0 g/dL 6.8(LL) 6.4(LL) 6.7(LL)  Hematocrit 36.0 - 46.0 % 21.5(L) 21.3(L) 21.6(L)  Platelets 150 - 400 K/uL 180 227 201    Intermittent SpO2 drop while asleep  BP drop after weaning off dobutamine   eICU Interventions  Transfuse 1 unit PRBC  Add auto CPAP while asleep  Restart dobutamine 2.5 to 5 mcg and monitor heart rate/rhythm.      Intervention Category Major Interventions: Other:  Jaydee Ingman 02/17/2017, 10:33 PM

## 2017-02-17 NOTE — Progress Notes (Signed)
Initial Nutrition Assessment  DOCUMENTATION CODES:   Obesity unspecified  INTERVENTION:  - Continue to encourage PO intakes.  - Diet advancement as medically feasible. - Will order daily multivitamin with minerals.   NUTRITION DIAGNOSIS:   Inadequate oral intake related to acute illness, poor appetite as evidenced by per patient/family report.  GOAL:   Patient will meet greater than or equal to 90% of their needs  MONITOR:   PO intake, Diet advancement, Weight trends, Labs, I & O's  REASON FOR ASSESSMENT:   Malnutrition Screening Tool  ASSESSMENT:   56 year old female with PMH of Obesity presents to ED on 6/24 with complaints of weakness and pain for the last 3 days. 2 weeks prior had a mechanical fall which resulted in right knee pain. Patient reports that she has not had anything to eat or drink as she has been laying in bed for 3 days unable to move, reports she lives with a roommate however they refused to help. EMS arrived to house on 6/21 and found patient laying face down on an air-mattress, however refused help. EMS reports that living situation was poor and house was in un-livable conditions.  Pt seen for MST. BMI indicates obesity/borderline morbid obesity. Pt was NPO until 9:50 AM when diet was advanced to CLD. CLD tray untouched in front of pt at time of RD visit. Pt reports that prior to tray delivery she had taken a few quick sips of juice and felt nauseated and had a very small amount of emesis (RN note states 76mL of bloody emesis). Pt states that she did not have anything to eat or drink for 3 days PTA and feels that her stomach is not used to having anything in it. She is hopeful that she will be able to consume something more slowly without N/V later today. Will not order oral nutrition supplements at this time but may at follow-up dependent on intakes and symptoms at that time.   Physical assessment shows no muscle or fat wasting although this may be masked by  severe edema throughout upper and lower body. Pt unsure of UBW or weight recently. No previous weight hx available in the chart. Unable to state malnutrition based on information currently available.  Medications reviewed; 40 mg IV Lasix x1 dose yesterday, sliding scale Novolog, 80 mg IV Protonix x1 dose today, 8 mg/hr IV Protonix x72 hours starting today at 1130. Labs reviewed; CBG: 125 mg/dL this AM, Cl: 113 mmol/L, BUN: 59 mg/dL, creatinine: 1.96 mg/dL, GFR: 32 mL/min.    Diet Order:  Diet clear liquid Room service appropriate? Yes; Fluid consistency: Thin Diet NPO time specified  Skin:  Wound (see comment) (Stage 2 bilateral buttocks pressure injuries)  Last BM:  6/24  Height:   Ht Readings from Last 1 Encounters:  02/16/17 5\' 5"  (1.651 m)    Weight:   Wt Readings from Last 1 Encounters:  02/16/17 237 lb 10.5 oz (107.8 kg)    Ideal Body Weight:  56.82 kg  BMI:  Body mass index is 39.55 kg/m.  Estimated Nutritional Needs:   Kcal:  1620-1940 (15-18 kcal/kg)  Protein:  90-100 grams  Fluid:  per MD given severe edema, GIB  EDUCATION NEEDS:   No education needs identified at this time    Jarome Matin, MS, RD, LDN, CNSC Inpatient Clinical Dietitian Pager # [phone] After hours/weekend pager # [phone]

## 2017-02-17 NOTE — Progress Notes (Signed)
Pharmacy Antibiotic Note  Samantha Richards is a 56 y.o. female presented to the ED on 02/16/2017 with c/o weakness and was found to have severe anemia with hgb of 3.7.  Ceftriaxone was started on admission for cellulitis.  To add vancomycin to abx regimen on 6/25.  Today, 02/17/2017: - afeb, wbc up 20.5 -  scr elevated but stable at 1.96 (crcl~38 N)  Plan: - vancomycin 2gm IV x1, then 1250 mg IV q24h - continue ceftriaxone 2gm IV q24h - monitor renal function closely ________________________________  Height: 5\' 5"  (165.1 cm) Weight: 237 lb 10.5 oz (107.8 kg) IBW/kg (Calculated) : 57  Temp (24hrs), Avg:98.4 F (36.9 C), Min:97.4 F (36.3 C), Max:99.5 F (37.5 C)   Recent Labs Lab 02/16/17 1421 02/16/17 1439 02/16/17 1516 02/17/17 0327  WBC 19.1*  --  18.9* 20.5*  CREATININE 1.96*  --   --  1.96*  LATICACIDVEN  --  2.46*  --  1.4    Estimated Creatinine Clearance: 40.5 mL/min (A) (by C-G formula based on SCr of 1.96 mg/dL (H)).    Allergies  Allergen Reactions  . Gentamicin Itching and Rash     Thank you for allowing pharmacy to be a part of this patient's care.  Lynelle Doctor 02/17/2017 10:38 AM

## 2017-02-17 NOTE — Progress Notes (Signed)
PULMONARY / CRITICAL CARE MEDICINE   Name: Samantha Richards MRN: [id number] DOB: [birth year]    ADMISSION DATE:  02/16/2017 CONSULTATION DATE:  02/16/2017  REFERRING MD:  Nils Flack, PA   CHIEF COMPLAINT:  Anemia   BRIEF 56 y/o female with obesity admitted on 6/25 with anemia after a fall.  Noted some bloody stool for 2-3 weeks, but says it was only a few clots per day.  Noted to have a Hgb of 3 on admission.   SUBJECTIVE:  Some cough Some pain on R side Nausea last night  VITAL SIGNS: BP (!) 72/53 (BP Location: Left Arm)   Pulse (!) 129   Temp 99.5 F (37.5 C)   Resp (!) 25   Ht 5\' 5"  (1.651 m)   Wt 237 lb 10.5 oz (107.8 kg)   LMP 01/16/2017   SpO2 96%   BMI 39.55 kg/m   HEMODYNAMICS:    VENTILATOR SETTINGS:    INTAKE / OUTPUT: I/O last 3 completed shifts: In: 1330 [Blood:1330] Out: 500 [Urine:500]  PHYSICAL EXAMINATION:  General:  Resting comfortably in bed HENT: NCAT OP clear PULM: CTA B, normal effort CV: Tachy rate, no mgr GI: BS+, soft, nontender MSK: normal bulk and tone Neuro: awake, alert, no distress, MAEW DERM: edema R hip/leg, no bruising noted  LABS:  BMET  Recent Labs Lab 02/16/17 1421 02/17/17 0327  NA 140 142  K 4.2 4.2  CL 112* 113*  CO2 17* 17*  BUN 52* 59*  CREATININE 1.96* 1.96*  GLUCOSE 119* 141*    Electrolytes  Recent Labs Lab 02/16/17 1421 02/17/17 0327  CALCIUM 9.1 9.0  MG  --  2.1  PHOS  --  4.4    CBC  Recent Labs Lab 02/16/17 1421 02/16/17 1516 02/17/17 0327  WBC 19.1* 18.9* 20.5*  HGB 3.7* 3.8* 6.0*  HCT 14.2* 14.6* 19.9*  PLT 288 300 210    Coag's  Recent Labs Lab 02/16/17 1421  APTT 35  INR 1.40    Sepsis Markers  Recent Labs Lab 02/16/17 1439 02/17/17 0327  LATICACIDVEN 2.46* 1.4    ABG No results for input(s): PHART, PCO2ART, PO2ART in the last 168 hours.  Liver Enzymes  Recent Labs Lab 02/16/17 1421  AST 30  ALT 22  ALKPHOS 105  BILITOT 2.9*  ALBUMIN 2.8*    Cardiac  Enzymes  Recent Labs Lab 02/17/17 0327  TROPONINI <0.03    Glucose  Recent Labs Lab 02/16/17 1516 02/16/17 2345 02/17/17 0745  GLUCAP 129* 142* 125*    Imaging Dg Chest Port 1 View  Result Date: 02/16/2017 CLINICAL DATA:  Shortness of breath, possible fall 2 weeks ago. EXAM: PORTABLE CHEST 1 VIEW COMPARISON:  None. FINDINGS: Cardiomegaly, moderate two severe. Upper mediastinal contours, including aortic arch, difficult to characterize due to patient positioning. Central pulmonary vascular congestion and probable mild perihilar edema. No pneumothorax seen. No acute or suspicious osseous finding. IMPRESSION: 1. Cardiomegaly, moderate to severe degree. 2. Central pulmonary vascular congestion and probable mild perihilar interstitial edema suggesting mild CHF/volume overload. Electronically Signed   By: Franki Cabot M.D.   On: 02/16/2017 14:55   Dg Knee Complete 4 Views Right  Result Date: 02/16/2017 CLINICAL DATA:  Right knee pain, possible fall 2 weeks ago. EXAM: RIGHT KNEE - COMPLETE 4+ VIEW COMPARISON:  None. FINDINGS: No evidence of fracture, dislocation, or joint effusion. No evidence of arthropathy or other focal bone abnormality. Soft tissues are unremarkable. IMPRESSION: Negative. Electronically Signed   By: Franki Cabot  M.D.   On: 02/16/2017 14:53     STUDIES:  CXR 6/24 > Cardiomegaly, moderate to severe degree, central vascular pulmonary congestion and probable mild perihilar interstitial edema suggesting mild CHF/volume overload  XR right Knee 6/24 > No Acute ECHO 6/24 >>  CULTURES: Blood 6/24 >>  ANTIBIOTICS: Vancomycin 6/24 >  Rocephin 6/24 >>  SIGNIFICANT EVENTS: 6/24 > Presents to ED   LINES/TUBES: PIV   DISCUSSION: 56 year old female presents to ED with progressive weakness, found to have hemoglobin of 3.7. Rectal exam revealed internal hemorrhoids with negative hemoccult test but the patient notes some blood clots per rectum for 2-3 weeks prior to  admission. There was some concern for hypoxemia on admission, resolved by 6/25.  ASSESSMENT / PLAN:  PULMONARY A: Cough, chest congestion P:   Add robitussin DM Monitor O2 saturation  CARDIOVASCULAR A:  Hypotension in setting of anemia, no bleeding; cortisol normal Lactic acid cleared which is reassuring Sepsis? Unclear CHF? Does not appear this way based on exam  Tachycardia> appears sinus on exam P:  Tele Monitor BP closely> target MAP > 55 Bolus saline now Follow Hgb closely Continue saline infusion Check 12 lead now If no response to Saline bolus will start vasopressors  RENAL A:   Acute Kidney Injury  P:   Continue IVF administration Monitor BMET and UOP Replace electrolytes as needed   GASTROINTESTINAL A:   Slow GI bleeding P:   GI to see today Keep NPO for now PPI  HEMATOLOGIC A:   Anemia, bleeding appears to be out of proportion to blood per stool P:  Maintain Hgb > 7gm/dL CT abdomen/pelvis now to evaluate for RP bleeding given recent fall  INFECTIOUS A:   Skin breakdown/wounds present on admission Cellulitis P:   Add vanc again Continue ceftriaxone Monitor blood cultures Wound care  ENDOCRINE A:   No issues    P:   Trend Glucose   NEUROLOGIC A:   Muscular deconditioning P:   PT consult when more hemodynamically stable   FAMILY  - Updates: no family present  - Inter-disciplinary family meet or Palliative Care meeting due by:  02/23/2017   My cc time 35 minutes  Roselie Awkward, MD [facility] Pager: [phone] Cell: [phone] After 3pm or if no response, call [phone]   02/17/2017, 8:50 AM

## 2017-02-17 NOTE — Progress Notes (Signed)
RN spoke with Dr. Halford Chessman regarding pt HR and BP on Dobutamine gtt. New orders received. Will continue to monitor.

## 2017-02-17 NOTE — Progress Notes (Signed)
[facility] Progress Note Patient Name: Samantha Richards DOB: [birth year] MRN: [id number]   Date of Service  02/17/2017  HPI/Events of Note  Repeat hemoglobin 6.0 post 3 units packed red blood cells. Appropriate increase in hemoglobin. Patient remains tachycardic.   eICU Interventions  1. Transfusing 1 unit packed red blood cells over 2 hours 2. Repeat hemoglobin/hematocrit posttransfusion      Intervention Category Major Interventions: Other:  Tera Partridge 02/17/2017, 4:11 AM

## 2017-02-17 NOTE — Progress Notes (Signed)
CRITICAL VALUE ALERT  Critical Value:  Hgb 6  Date & Time Notied:  0348  Provider Notified: Dr Ashok Cordia  Orders Received/Actions taken: PRBC ordered

## 2017-02-17 NOTE — Consult Note (Signed)
Referring Provider:  [facility] Primary Care Physician:  Patient, No Pcp Per Primary Gastroenterologist:  Unassigned  Reason for Consultation:  Symptomatic anemia  HPI: Samantha Richards is a 56 y.o. female was brought into the [facility] by EMS for shortness of breath. Patient was found to have anemia with hemoglobin of 3.7 on admission. Occult blood negative. GI is consulted for further evaluation.  Patient seen and examined at bedside. According to patient, see had a fall underwent fluoroscopy from 2 weeks ago resulting in right knee injury. Patient subsequently had limited mobility. Patient had noted few episodes of bright blood per rectum as well as 1 episode of dark stool several days ago. No bowel movement in last 3 days. Patient is complaining of worsening abdominal distention. Patient is also complaining of worsening shortness of breath as well as lower oximetry edema. Only taken a few Motrin after the fall and she denied any significant use of NSAIDs.  No previous endoscopic workup. No history of bleeding or anemia in the past. No family history of colon cancer.  History reviewed. No pertinent past medical history.  History reviewed. No pertinent surgical history.  Prior to Admission medications   Not on File    Scheduled Meds: . furosemide  40 mg Intravenous Once  . insulin aspart  0-15 Units Subcutaneous Q4H   Continuous Infusions: . sodium chloride     Followed by  . sodium chloride Stopped (02/16/17 1518)  . sodium chloride    . sodium chloride    . cefTRIAXone (ROCEPHIN)  IV Stopped (02/17/17 5456)   PRN Meds:.sodium chloride, guaiFENesin-dextromethorphan, lip balm, ondansetron (ZOFRAN) IV  Allergies as of 02/16/2017 - Review Complete 02/16/2017  Allergen Reaction Noted  . Gentamicin Itching and Rash 02/16/2017    No family history on file.  Social History   Social History  . Marital status: Single    Spouse name: N/A  . Number of children: N/A  . Years of  education: N/A   Occupational History  . Not on file.   Social History Main Topics  . Smoking status: Not on file  . Smokeless tobacco: Not on file  . Alcohol use Not on file  . Drug use: Unknown  . Sexual activity: Not on file   Other Topics Concern  . Not on file   Social History Narrative  . No narrative on file    Review of Systems: Review of Systems  Constitutional: Positive for malaise/fatigue. Negative for fever.  HENT: Negative for ear discharge, ear pain, hearing loss and tinnitus.   Eyes: Negative for blurred vision and double vision.  Respiratory: Positive for shortness of breath. Negative for cough and hemoptysis.   Cardiovascular: Negative for chest pain and palpitations.  Gastrointestinal: Positive for abdominal pain, blood in stool, melena and nausea. Negative for vomiting.  Genitourinary: Negative for dysuria.  Musculoskeletal: Positive for falls, joint pain and myalgias.  Skin: Positive for rash.  Neurological: Positive for weakness. Negative for dizziness, seizures and headaches.  Endo/Heme/Allergies: Does not bruise/bleed easily.  Psychiatric/Behavioral: Negative for hallucinations and suicidal ideas.    Physical Exam: Vital signs: Vitals:   02/17/17 0727 02/17/17 0800  BP: (!) 82/50 (!) 72/53  Pulse: (!) 128 (!) 129  Resp: (!) 29 (!) 25  Temp: 98.3 F (36.8 C) 99.5 F (37.5 C)   Last BM Date: 02/16/17 Physical Exam  Constitutional: She is oriented to person, place, and time. She appears well-developed and well-nourished. She appears distressed.  HENT:  Head: Normocephalic  and atraumatic.  Mouth/Throat: Oropharynx is clear and moist. No oropharyngeal exudate.  Eyes: EOM are normal. No scleral icterus.  Neck: Normal range of motion. Neck supple. No thyromegaly present.  Cardiovascular: Regular rhythm and normal heart sounds.   Tachycardia   Pulmonary/Chest: Effort normal and breath sounds normal. No respiratory distress.  Abdominal: Soft.  Bowel sounds are normal. She exhibits distension. There is tenderness. There is no rebound and no guarding.  Patient has a abdominal distention with anasarca as well as lower quadrant discomfort.  Musculoskeletal: She exhibits edema and tenderness.  Patient has 3+ lower extremity edema with bilateral tenderness on palpation  Neurological: She is alert and oriented to person, place, and time.  Skin: Skin is warm. Rash noted. There is erythema.  Psychiatric: She has a normal mood and affect. Her behavior is normal. Judgment normal.    GI:  Lab Results:  Recent Labs  02/16/17 1421 02/16/17 1516 02/17/17 0327  WBC 19.1* 18.9* 20.5*  HGB 3.7* 3.8* 6.0*  HCT 14.2* 14.6* 19.9*  PLT 288 300 210   BMET  Recent Labs  02/16/17 1421 02/17/17 0327  NA 140 142  K 4.2 4.2  CL 112* 113*  CO2 17* 17*  GLUCOSE 119* 141*  BUN 52* 59*  CREATININE 1.96* 1.96*  CALCIUM 9.1 9.0   LFT  Recent Labs  02/16/17 1421  PROT 6.2*  ALBUMIN 2.8*  AST 30  ALT 22  ALKPHOS 105  BILITOT 2.9*   PT/INR  Recent Labs  02/16/17 1421  LABPROT 17.2*  INR 1.40     Studies/Results: Dg Chest Port 1 View  Result Date: 02/16/2017 CLINICAL DATA:  Shortness of breath, possible fall 2 weeks ago. EXAM: PORTABLE CHEST 1 VIEW COMPARISON:  None. FINDINGS: Cardiomegaly, moderate two severe. Upper mediastinal contours, including aortic arch, difficult to characterize due to patient positioning. Central pulmonary vascular congestion and probable mild perihilar edema. No pneumothorax seen. No acute or suspicious osseous finding. IMPRESSION: 1. Cardiomegaly, moderate to severe degree. 2. Central pulmonary vascular congestion and probable mild perihilar interstitial edema suggesting mild CHF/volume overload. Electronically Signed   By: Franki Cabot M.D.   On: 02/16/2017 14:55   Dg Knee Complete 4 Views Right  Result Date: 02/16/2017 CLINICAL DATA:  Right knee pain, possible fall 2 weeks ago. EXAM: RIGHT KNEE -  COMPLETE 4+ VIEW COMPARISON:  None. FINDINGS: No evidence of fracture, dislocation, or joint effusion. No evidence of arthropathy or other focal bone abnormality. Soft tissues are unremarkable. IMPRESSION: Negative. Electronically Signed   By: Franki Cabot M.D.   On: 02/16/2017 14:53    Impression/Plan: - Symptomatic anemia with hemoglobin of 3.7 on admission, occult blood negative on initial evaluation. No bowel movement since last 3 days but had bright blood as well as dark stool prior to that for a few days - Leukocytosis with possible underlying sepsis - Acute kidney injury - Shortness of breath probably from CHF and anemia - Hypotension - Anasarca  Recommendations -------------------------- - Patient is status post-4 units of PRBC, remains hypotensive and tachycardic. She does not have any active overt bleeding at this time and I do believe she is not hemodynamically stable at this time for endoscopic intervention. patient is not in apparent respiratory distress but she has some shortness of breath. Continue supportive management for now. Transfuse as needed to keep hemoglobin more than 7.  - She may need EGD and colonoscopy prior to discharge if her clinical condition allows endoscopic intervention - Follow CT scan -  Start IV BID PPI  - GI will follow     LOS: 1 day   [name]  MD, FACP 02/17/2017, 9:32 AM  Pager [phone] If no answer or after 5 PM call [phone]

## 2017-02-17 NOTE — Progress Notes (Signed)
CSW attempted to respond to consult for neglect on the medical floor but pt was having IV's changed, other assistance and blood drawn and after 30 minutes CSW attempted to respond to other consults.  Alphonse Guild. Ngoc Daughtridge, Reed Pandy, CSI Clinical Social Worker Ph: [phone]    '

## 2017-02-17 NOTE — Progress Notes (Addendum)
[facility] Progress Note Patient Name: Samantha Richards DOB: 1960-11-15 MRN: [id number]   Date of Service  02/17/2017  HPI/Events of Note  Persistent tachycardia on dobutamine.  A fib on monitor.   EICU Interventions  Will change back to phenylephrine and wean off dobutamine.  Will start amiodarone.      Intervention Category Major Interventions: Other:  Jodean Valade 02/17/2017, 8:47 PM

## 2017-02-17 NOTE — Progress Notes (Signed)
Dear Doctor:  This patient has been identified as a candidate for PICC for the following reason (s): poor veins/poor circulatory system (CHF, COPD, emphysema, diabetes, steroid use, IV drug abuse, etc.) and incompatible drugs (aminophyllin, TPN, heparin, given with an antibiotic) If you agree, please write an order for the indicated device. For any questions contact the Vascular Access Team at [phone] if no answer, please leave a message.  Thank you for supporting the early vascular access assessment program.

## 2017-02-17 NOTE — Progress Notes (Signed)
  Echocardiogram 2D Echocardiogram has been performed.  Samantha Richards 02/17/2017, 1:24 PM

## 2017-02-17 NOTE — Progress Notes (Signed)
EKG shows Aflutter. Juliane Lack, NP informed. Luther Parody, RN

## 2017-02-18 ENCOUNTER — Encounter (HOSPITAL_COMMUNITY): Payer: Self-pay

## 2017-02-18 ENCOUNTER — Inpatient Hospital Stay (HOSPITAL_COMMUNITY): Payer: Self-pay

## 2017-02-18 DIAGNOSIS — R531 Weakness: Secondary | ICD-10-CM

## 2017-02-18 DIAGNOSIS — D649 Anemia, unspecified: Secondary | ICD-10-CM | POA: Diagnosis present

## 2017-02-18 DIAGNOSIS — I5021 Acute systolic (congestive) heart failure: Secondary | ICD-10-CM

## 2017-02-18 DIAGNOSIS — K921 Melena: Secondary | ICD-10-CM

## 2017-02-18 DIAGNOSIS — R188 Other ascites: Secondary | ICD-10-CM

## 2017-02-18 DIAGNOSIS — D62 Acute posthemorrhagic anemia: Secondary | ICD-10-CM | POA: Diagnosis present

## 2017-02-18 LAB — BASIC METABOLIC PANEL
Anion gap: 9 (ref 5–15)
BUN: 65 mg/dL — ABNORMAL HIGH (ref 6–20)
CALCIUM: 8.6 mg/dL — AB (ref 8.9–10.3)
CO2: 18 mmol/L — ABNORMAL LOW (ref 22–32)
Chloride: 114 mmol/L — ABNORMAL HIGH (ref 101–111)
Creatinine, Ser: 1.99 mg/dL — ABNORMAL HIGH (ref 0.44–1.00)
GFR calc non Af Amer: 27 mL/min — ABNORMAL LOW (ref >60)
GFR, EST AFRICAN AMERICAN: 32 mL/min — AB (ref >60)
Glucose, Bld: 148 mg/dL — ABNORMAL HIGH (ref 65–99)
Potassium: 4 mmol/L (ref 3.5–5.1)
SODIUM: 141 mmol/L (ref 135–145)

## 2017-02-18 LAB — GLUCOSE, CAPILLARY
GLUCOSE-CAPILLARY: 139 mg/dL — AB (ref 65–99)
GLUCOSE-CAPILLARY: 157 mg/dL — AB (ref 65–99)
Glucose-Capillary: 148 mg/dL — ABNORMAL HIGH (ref 65–99)
Glucose-Capillary: 129 mg/dL — ABNORMAL HIGH (ref 65–99)
Glucose-Capillary: 152 mg/dL — ABNORMAL HIGH (ref 65–99)
Glucose-Capillary: 167 mg/dL — ABNORMAL HIGH (ref 65–99)

## 2017-02-18 LAB — BPAM RBC
BLOOD PRODUCT EXPIRATION DATE: 201807192359
BLOOD PRODUCT EXPIRATION DATE: 201807192359
BLOOD PRODUCT EXPIRATION DATE: 201807212359
BLOOD PRODUCT EXPIRATION DATE: 201807252359
Blood Product Expiration Date: 201807192359
Blood Product Expiration Date: 201807212359
ISSUE DATE / TIME: 201806241631
ISSUE DATE / TIME: 201806241942
ISSUE DATE / TIME: 201806242234
ISSUE DATE / TIME: 201806250427
ISSUE DATE / TIME: 201806251534
ISSUE DATE / TIME: 201806252310
UNIT TYPE AND RH: 5100
UNIT TYPE AND RH: 5100
UNIT TYPE AND RH: 5100
Unit Type and Rh: 5100
Unit Type and Rh: 5100
Unit Type and Rh: 5100

## 2017-02-18 LAB — CBC WITH DIFFERENTIAL/PLATELET
BASOS ABS: 0 10*3/uL (ref 0.0–0.1)
BASOS PCT: 0 %
Basophils Absolute: 0 10*3/uL (ref 0.0–0.1)
Basophils Relative: 0 %
EOS PCT: 1 %
Eosinophils Absolute: 0 10*3/uL (ref 0.0–0.7)
Eosinophils Absolute: 0.2 10*3/uL (ref 0.0–0.7)
Eosinophils Relative: 0 %
HCT: 24.5 % — ABNORMAL LOW (ref 36.0–46.0)
HCT: 27.3 % — ABNORMAL LOW (ref 36.0–46.0)
Hemoglobin: 7.7 g/dL — ABNORMAL LOW (ref 12.0–15.0)
Hemoglobin: 8.5 g/dL — ABNORMAL LOW (ref 12.0–15.0)
LYMPHS ABS: 1.5 10*3/uL (ref 0.7–4.0)
LYMPHS ABS: 2 10*3/uL (ref 0.7–4.0)
Lymphocytes Relative: 7 %
Lymphocytes Relative: 8 %
MCH: 23.3 pg — ABNORMAL LOW (ref 26.0–34.0)
MCH: 23.1 pg — AB (ref 26.0–34.0)
MCHC: 31.4 g/dL (ref 30.0–36.0)
MCHC: 31.1 g/dL (ref 30.0–36.0)
MCV: 74.2 fL — ABNORMAL LOW (ref 78.0–100.0)
MCV: 74.2 fL — AB (ref 78.0–100.0)
MONO ABS: 1.5 10*3/uL — AB (ref 0.1–1.0)
MONO ABS: 1.7 10*3/uL — AB (ref 0.1–1.0)
Monocytes Relative: 7 %
Monocytes Relative: 7 %
NEUTROS ABS: 20.5 10*3/uL — AB (ref 1.7–7.7)
Neutro Abs: 18.2 10*3/uL — ABNORMAL HIGH (ref 1.7–7.7)
Neutrophils Relative %: 86 %
Neutrophils Relative %: 84 %
PLATELETS: 208 10*3/uL (ref 150–400)
Platelets: 173 10*3/uL (ref 150–400)
RBC: 3.3 MIL/uL — AB (ref 3.87–5.11)
RBC: 3.68 MIL/uL — ABNORMAL LOW (ref 3.87–5.11)
RDW: 28.2 % — AB (ref 11.5–15.5)
RDW: 28.5 % — AB (ref 11.5–15.5)
WBC: 21.2 10*3/uL — AB (ref 4.0–10.5)
WBC: 24.4 10*3/uL — ABNORMAL HIGH (ref 4.0–10.5)

## 2017-02-18 LAB — TYPE AND SCREEN
ABO/RH(D): O POS
ANTIBODY SCREEN: NEGATIVE
UNIT DIVISION: 0
UNIT DIVISION: 0
UNIT DIVISION: 0
Unit division: 0
Unit division: 0
Unit division: 0

## 2017-02-18 LAB — PREALBUMIN: Prealbumin: <5 mg/dL — ABNORMAL LOW (ref 18–38)

## 2017-02-18 LAB — GLUCOSE, PLEURAL OR PERITONEAL FLUID: Glucose, Fluid: 137 mg/dL

## 2017-02-18 LAB — PROTIME-INR
INR: 1.39
PROTHROMBIN TIME: 17.2 s — AB (ref 11.4–15.2)

## 2017-02-18 LAB — BODY FLUID CELL COUNT WITH DIFFERENTIAL
Lymphs, Fluid: 16 %
MONOCYTE-MACROPHAGE-SEROUS FLUID: 46 % — AB (ref 50–90)
NEUTROPHIL FLUID: 38 % — AB (ref 0–25)
WBC FLUID: 219 uL (ref 0–1000)

## 2017-02-18 LAB — HEPATIC FUNCTION PANEL
ALBUMIN: 2.7 g/dL — AB (ref 3.5–5.0)
ALK PHOS: 95 U/L (ref 38–126)
ALT: 18 U/L (ref 14–54)
AST: 19 U/L (ref 15–41)
BILIRUBIN TOTAL: 2.3 mg/dL — AB (ref 0.3–1.2)
Bilirubin, Direct: 1.4 mg/dL — ABNORMAL HIGH (ref 0.1–0.5)
Indirect Bilirubin: 0.9 mg/dL (ref 0.3–0.9)
Total Protein: 6 g/dL — ABNORMAL LOW (ref 6.5–8.1)

## 2017-02-18 LAB — LACTATE DEHYDROGENASE, PLEURAL OR PERITONEAL FLUID: LD, Fluid: 80 U/L — ABNORMAL HIGH (ref 3–23)

## 2017-02-18 LAB — GRAM STAIN

## 2017-02-18 LAB — LACTATE DEHYDROGENASE: LDH: 165 U/L (ref 98–192)

## 2017-02-18 LAB — COOXEMETRY PANEL
CARBOXYHEMOGLOBIN: 1.7 % — AB (ref 0.5–1.5)
Methemoglobin: 0.8 % (ref 0.0–1.5)
O2 Saturation: 69.5 %
Total hemoglobin: 8.2 g/dL — ABNORMAL LOW (ref 12.0–16.0)

## 2017-02-18 LAB — DIRECT ANTIGLOBULIN TEST (NOT AT ARMC)
DAT, IgG: NEGATIVE
DAT, complement: NEGATIVE

## 2017-02-18 LAB — ALBUMIN, PLEURAL OR PERITONEAL FLUID: Albumin, Fluid: 1.4 g/dL

## 2017-02-18 LAB — TSH: TSH: 11.713 u[IU]/mL — ABNORMAL HIGH (ref 0.350–4.500)

## 2017-02-18 MED ORDER — THIAMINE HCL 100 MG/ML IJ SOLN
100.0000 mg | Freq: Every day | INTRAMUSCULAR | Status: DC
  Administered 2017-02-18 – 2017-02-22 (×5): 100 mg via INTRAVENOUS
  Filled 2017-02-18 (×5): qty 2

## 2017-02-18 MED ORDER — NOREPINEPHRINE BITARTRATE 1 MG/ML IV SOLN
0.0000 ug/min | INTRAVENOUS | Status: DC
  Administered 2017-02-18: 2 ug/min via INTRAVENOUS
  Administered 2017-02-18: 8 ug/min via INTRAVENOUS
  Administered 2017-02-19: 2 ug/min via INTRAVENOUS
  Filled 2017-02-18 (×4): qty 4

## 2017-02-18 MED ORDER — SODIUM CHLORIDE 0.9% FLUSH
10.0000 mL | INTRAVENOUS | Status: DC | PRN
  Administered 2017-03-05: 20 mL
  Administered 2017-03-08: 10 mL
  Filled 2017-02-18 (×2): qty 40

## 2017-02-18 MED ORDER — CHLORHEXIDINE GLUCONATE CLOTH 2 % EX PADS: 6.0000 | MEDICATED_PAD | Freq: Every day | CUTANEOUS | Status: DC

## 2017-02-18 MED ORDER — SODIUM CHLORIDE 0.9% FLUSH
10.0000 mL | Freq: Two times a day (BID) | INTRAVENOUS | Status: DC
  Administered 2017-02-18: 10 mL
  Administered 2017-02-19: 30 mL
  Administered 2017-02-20 – 2017-02-23 (×8): 10 mL
  Administered 2017-02-24: 20 mL
  Administered 2017-02-24: 10 mL
  Administered 2017-02-25: 20 mL
  Administered 2017-02-25 – 2017-03-13 (×21): 10 mL

## 2017-02-18 MED ORDER — SODIUM CHLORIDE 0.9 % IV SOLN
1.0000 mg | Freq: Once | INTRAVENOUS | Status: AC
  Administered 2017-02-18: 1 mg via INTRAVENOUS
  Filled 2017-02-18: qty 0.2

## 2017-02-18 MED ORDER — CHLORHEXIDINE GLUCONATE CLOTH 2 % EX PADS
6.0000 | MEDICATED_PAD | Freq: Every day | CUTANEOUS | Status: DC
  Administered 2017-02-19 – 2017-03-13 (×22): 6 via TOPICAL

## 2017-02-18 NOTE — Progress Notes (Signed)
Order placed by Dr. Halford Chessman for CPAP h/s due to pt. having Apnea/Desaturation episodes, placed pt. on humidified/heated CPAP with 2 lpm Oxygen added to circuit, was on 2lpm n/c prior to CPAP being initiated, pressure placed @ 8 CMH20 max with Ramp started @ 4CMH20, tolerating fair, was taken off X10 min. due to bath being started, RN notified to let RT know when completed for pt. to be restarted on CPAP trial, RT to monitor.

## 2017-02-18 NOTE — Progress Notes (Signed)
Pt. placed back on CPAP after bath with M/FFM tolerating well with pressure <'d to 6 from 8, 2 lpm added to circuit, heater on, tolerating well, RN aware, RT to monitor

## 2017-02-18 NOTE — Procedures (Signed)
Central Venous Catheter Insertion Procedure Note Samantha Richards [id number] 08-23-1961  Procedure: Insertion of Central Venous Catheter Indications: Assessment of intravascular volume, Drug and/or fluid administration and Frequent blood sampling  Procedure Details Consent: Risks of procedure as well as the alternatives and risks of each were explained to the (patient/caregiver).  Consent for procedure obtained.   Time Out: Verified patient identification, verified procedure, site/side was marked, verified correct patient position, special equipment/implants available, medications/allergies/relevent history reviewed, required imaging and test results available.  Performed  Maximum sterile technique was used including antiseptics, cap, gloves, gown, hand hygiene, mask and sheet. Skin prep: Chlorhexidine; local anesthetic administered A triple lumen catheter was placed in the left internal jugular vein to 18 cm (pt 5'7) using the Seldinger technique.  Evaluation Blood flow good Complications: No apparent complications Patient did tolerate procedure well. Chest X-ray ordered to verify placement.  CXR: pending.   Procedure performed under direct supervision of Dr. Lake Bells and with ultrasound guidance for real time vessel cannulation.      Samantha Gens, NP-C [facility] Pgr: [phone] or if no answer [phone] 02/18/2017, 9:02 AM

## 2017-02-18 NOTE — Progress Notes (Addendum)
PULMONARY / CRITICAL CARE MEDICINE   Name: Samantha Richards MRN: [id number] DOB: 15-Nov-1960    ADMISSION DATE:  02/16/2017 CONSULTATION DATE:  02/16/2017  REFERRING MD:  Nils Flack, PA   CHIEF COMPLAINT:  Anemia   BRIEF 56 y/o female with obesity admitted on 6/25 with anemia after a fall.  Noted some bloody stool for 2-3 weeks, but says it was only a few clots per day.  Noted to have a Hgb of 3 on admission.   SUBJECTIVE:  Echo yesterday: LVEF 30%, RV overload Anemia persists AFib with RVR overnight > amiodarone added Intolerant of CPAP Dobutamine, neo added yesterday  VITAL SIGNS: BP (!) 96/57   Pulse (!) 114   Temp 97.7 F (36.5 C)   Resp 16   Ht 5\' 5"  (1.651 m)   Wt 250 lb 3.6 oz (113.5 kg)   LMP 01/16/2017 Comment: neg preg 02-16-2017  SpO2 100%   BMI 41.64 kg/m   HEMODYNAMICS:    VENTILATOR SETTINGS:    INTAKE / OUTPUT: I/O last 3 completed shifts: In: 4110.4 [I.V.:2108.9; Blood:1991.5; Other:10] Out: 1215 [Urine:1215]  PHYSICAL EXAMINATION:  General:  Resting comfortably in bed HENT: NCAT OP clear PULM: CTA B, normal effort CV: RRR, no mgr GI: BS+, soft, nontender MSK: normal bulk and tone Neuro: awake, alert, no distress, MAEW Derm: massive edema legs/hips   LABS:  BMET  Recent Labs Lab 02/17/17 0327 02/17/17 1529 02/18/17 0432  NA 142 140 141  K 4.2 4.0 4.0  CL 113* 114* 114*  CO2 17* 18* 18*  BUN 59* 62* 65*  CREATININE 1.96* 2.05* 1.99*  GLUCOSE 141* 161* 148*    Electrolytes  Recent Labs Lab 02/17/17 0327 02/17/17 1529 02/18/17 0432  CALCIUM 9.0 8.7* 8.6*  MG 2.1  --   --   PHOS 4.4  --   --     CBC  Recent Labs Lab 02/17/17 1529 02/17/17 2136 02/18/17 0432  WBC 20.2* 18.5* 21.2*  HGB 6.4* 6.8* 7.7*  HCT 21.3* 21.5* 24.5*  PLT 227 180 173    Coag's  Recent Labs Lab 02/16/17 1421  APTT 35  INR 1.40    Sepsis Markers  Recent Labs Lab 02/16/17 1439 02/17/17 0327  LATICACIDVEN 2.46* 1.4    ABG No  results for input(s): PHART, PCO2ART, PO2ART in the last 168 hours.  Liver Enzymes  Recent Labs Lab 02/16/17 1421  AST 30  ALT 22  ALKPHOS 105  BILITOT 2.9*  ALBUMIN 2.8*    Cardiac Enzymes  Recent Labs Lab 02/17/17 1103 02/17/17 1529 02/17/17 2136  TROPONINI <0.03 <0.03 <0.03    Glucose  Recent Labs Lab 02/17/17 0745 02/17/17 1223 02/17/17 1800 02/17/17 2003 02/17/17 2333 02/18/17 0333  GLUCAP 125* 147* 154* 156* 149* 148*    Imaging Ct Abdomen Pelvis Wo Contrast  Result Date: 02/17/2017 CLINICAL DATA:  56 year old female with history of progressive weakness and blood clots per rectum for the past 2-3 weeks. EXAM: CT ABDOMEN AND PELVIS WITHOUT CONTRAST TECHNIQUE: Multidetector CT imaging of the abdomen and pelvis was performed following the standard protocol without IV contrast. COMPARISON:  None. FINDINGS: Comment: Study is limited for detection and characterization of visceral and/or vascular lesions by lack of IV contrast. Lower chest: Trace bilateral pleural effusions lying dependently. Dependent opacities in the lung bases may reflect areas of chronic scarring and/or subsegmental atelectasis. Cardiomegaly with severe right atrial dilatation. Low-attenuation of the intravascular compartment, indicative of anemia. Hepatobiliary: Diffuse low attenuation throughout the liver, indicative of hepatic  steatosis. No discrete hepatic lesions are identified on today's noncontrast CT examination. Unenhanced appearance of the gallbladder is unremarkable. Pancreas: No definite pancreatic mass or peripancreatic inflammatory changes noted on today's noncontrast CT examination. Spleen: Unremarkable. Adrenals/Urinary Tract: Unenhanced appearance of the kidneys and adrenal glands bilaterally is unremarkable. No hydroureteronephrosis. Urinary bladder is nearly completely decompressed around an indwelling Foley catheter. Stomach/Bowel: Unenhanced appearance of the stomach is normal. There is  no pathologic dilatation of small bowel or colon. Numerous appendicoliths are noted within the appendix. The appendix is not appear to be dilated. Vascular/Lymphatic: Minimal atherosclerotic calcifications are noted in the pelvic vasculature. No definite lymphadenopathy identified in the abdomen or pelvis on today's noncontrast CT examination. Reproductive: Uterus is markedly enlarged measuring approximately 11.3 x 16.3 x 15.8 cm. There are numerous uterine lesions which presumably reflect multifocal fibroids, the largest of which is partially exophytic in the fundal portion on the right side measuring up to 10.1 cm in diameter. Adjacent to the left side of the uterine fundus extending cephalad there are several soft tissue attenuation lesions, largest of which measures 5.7 x 4.9 cm (axial image 59 of series 2) which could simply reflect exophytic subserosal fibroids, however, the possibility of peritoneal implants are not excluded. Ovaries are poorly visualized on today's noncontrast CT examination. Other: Moderate to large volume of ascites. No definite pneumoperitoneum. Diffuse body wall edema (severe). Musculoskeletal: There are no aggressive appearing lytic or blastic lesions noted in the visualized portions of the skeleton. IMPRESSION: 1. Findings compatible with a state of anasarca, including small bilateral pleural effusions, moderate to large volume of ascites, and diffuse severe body wall edema. 2. Probable fibroid uterus. Some of these fibroids appear to be pedunculated, particularly in the left side of the uterine fundus. Alternatively, the possibility of peritoneal implants in the setting of metastatic disease is not excluded. Cytologic analysis of this ascites is suggested to exclude the possibility of malignant cells. 3. Cardiomegaly with severe right atrial dilatation. 4. Atherosclerosis. 5. Low-attenuation of the intravascular compartment, indicative of anemia. 6. Additional incidental findings, as  above. Electronically Signed   By: Vinnie Langton M.D.   On: 02/17/2017 15:42     STUDIES:  CXR 6/24 > Cardiomegaly, moderate to severe degree, central vascular pulmonary congestion and probable mild perihilar interstitial edema suggesting mild CHF/volume overload  XR right Knee 6/24 > No Acute ECHO 6/24 >>  LVEF 30-35%, RV mod dilated, severe hypokinesis, LA dilated, mild MR, RA mod dilated CT abdomen/pelvis 6/26> notable ascites, hepatic steatosis, large uterine fibroids vs peritoneal mets  CULTURES: Blood 6/24 >> coag neg staph 2/4  ANTIBIOTICS: Vancomycin 6/24 >  Rocephin 6/24 >>  SIGNIFICANT EVENTS: 6/24 > Presents to ED   LINES/TUBES: PIV   DISCUSSION: 56 y/o female admitted with critical anemia in the setting of non-brisk GI bleeding, found to have shock, presumed AKI and systolic heart failure with RV overload.  Has required vasopressor support for presumed mixed hypovolemic (hemorrhagic) and cardiogenic shock.  Noted on CT abdomen  ASSESSMENT / PLAN:  PULMONARY A: Cough, chest congestion Probable sleep apnea, intolerant of CPAP in hospital P:   Continue robitussin DM Monitor O2 saturation Will need outpatient sleep study Hold of on using CPAP for now  CARDIOVASCULAR A:  Cardiogenic shock Acute systolic heart failure, ddx ischemic vs non-ischemic (nutritional related?) Afib with RVR RV overload/cor pulmonale P:  Tele Place central line to measure CVP, Coox Change dobutamine and neo to levophed Continue amiodarone for now Will need RHC at  some point Goal MAP with levophed > 65  RENAL A:   Acute Kidney Injury, presumed (unclear baseline) P:   KVO fluids Monitor BMET and UOP Replace electrolytes as needed   GASTROINTESTINAL A:   Slow GI bleeding Some upper gi bleeding with vomiting Hepatic steatosis Probable protein-calorie malnutrition Ascites Peritoneal mets vs uterine fibroids P:   Continue PPI drip Endoscopy at some point, I don't  think it's necessary or safe today unless she briskly bleeds GI following, ?role for octreotide? Paracentesis today > send for culture, cell count, glucose, CYTOLOGY Nutrition consult, advance diet today, assess nutritional status Check prealbumin Empirically add thiamine, folic acid Check LFTs  HEMATOLOGIC A:   Anemia, bleeding appears to be out of proportion to blood per stool P:  Maintain Hgb > 7gm/dL Evaluate for hemolysis> extravascular?  Check coombs Check haptoglobin, check LDH  INFECTIOUS A:   Skin breakdown/wounds present on admission Cellulitis Probable blood culture contamination (staph epi 2/4 cultures) P:   Continue vanc again Continue ceftriaxone Monitor blood cultures Wound care  ENDOCRINE A:   Mild hyperglycemia P:   Trend Glucose  Check TSH SSI> target 140-180  NEUROLOGIC A:   Muscular deconditioning P:   PT consult when more hemodynamically stable   FAMILY  - Updates: no family present  - Inter-disciplinary family meet or Palliative Care meeting due by:  02/23/2017   My cc time 45 minutes  Roselie Awkward, MD [facility] Pager: [phone] Cell: [phone] After 3pm or if no response, call [phone]   02/18/2017, 7:22 AM

## 2017-02-18 NOTE — Procedures (Signed)
Ultrasound-guided diagnostic and therapeutic paracentesis performed yielding 2.5 liters (maximum ordered) of clear, yellow fluid. No immediate complications. A portion of the fluid was sent to the lab for preordered studies.

## 2017-02-18 NOTE — Progress Notes (Signed)
[facility] Progress Note  Samantha Richards 56 y.o. Jul 20, 1961  CC:  Anemia, GI bleed   Subjective: Patient feeling somewhat better. She remains tachycardic. She has borderline hypertension. She had small amount of streaks of blood in the vomiting yesterday. Denied black tarry stool or blood in the stool.  ROS : Positive for weakness and shortness of breath. Negative for chest pain   Objective: Vital signs in last 24 hours: Vitals:   02/18/17 0600 02/18/17 0630  BP: 102/60 (!) 96/57  Pulse: (!) 114   Resp: 16   Temp: 97.7 F (36.5 C)     Physical Exam:  General:  Alert, cooperative, no distress, appears stated age  Head:  Normocephalic, without obvious abnormality, atraumatic  Eyes:  , EOM's intact,   Lungs:   Bilateral crackles on exam. Anterior exam only. No  distress   Heart:  Regular rate and rhythm, S1, S2 normal  Abdomen:   Distended with the lower quadrant anasarca, bowel sounds present. No peritoneal signs.   Extremities: 3+ lower Ext edema        Lab Results:  Recent Labs  02/17/17 0327 02/17/17 1529 02/18/17 0432  NA 142 140 141  K 4.2 4.0 4.0  CL 113* 114* 114*  CO2 17* 18* 18*  GLUCOSE 141* 161* 148*  BUN 59* 62* 65*  CREATININE 1.96* 2.05* 1.99*  CALCIUM 9.0 8.7* 8.6*  MG 2.1  --   --   PHOS 4.4  --   --     Recent Labs  02/16/17 1421  AST 30  ALT 22  ALKPHOS 105  BILITOT 2.9*  PROT 6.2*  ALBUMIN 2.8*    Recent Labs  02/16/17 1421  02/17/17 2136 02/18/17 0432  WBC 19.1*  < > 18.5* 21.2*  NEUTROABS 16.6*  --   --  18.2*  HGB 3.7*  < > 6.8* 7.7*  HCT 14.2*  < > 21.5* 24.5*  MCV 60.9*  < > 72.6* 74.2*  PLT 288  < > 180 173  < > = values in this interval not displayed.  Recent Labs  02/16/17 1421  LABPROT 17.2*  INR 1.40      Assessment/Plan: - Symptomatic anemia with hemoglobin of 3.7 on admission, occult blood negative on initial evaluation. No bowel movement since last 3 days but had bright blood as well as dark  stool prior to that for a few days. Streaks of blood in the vomiting yesterday. - Leukocytosis with possible underlying sepsis - Acute kidney injury - Shortness of breath probably from CHF and anemia - Hypotension/ cardiogenic shock - Anasarca  Recommendations -------------------------- - Case discussed with critical care attending Dr. Lake Bells. Patient not stable for any endoscopi intervention today. Hemoglobin has improved after blood transfusion. CT scan yesterday showed moderate to large amount of ascites as well as multiple uterine fibroids and possibility of peritoneal implants cannot be ruled out. She is scheduled for diagnostic paracentesis today. Echo  Yesterday showed EF of 30%. - Monitor H&H. Continue PPI. Patient has normal LFTs. Normal platelet counts. CT scan showed no cirrhosis. I do not see any need or indication to start octreotide. - GI will follow   [name] MD, [place] 02/18/2017, 9:52 AM  Pager [phone]  If no answer or after 5 PM call [phone]

## 2017-02-18 NOTE — Consult Note (Signed)
Cardiology Consultation:   Patient ID: Samantha Richards; [id number]; August 24, 1961   Admit date: 02/16/2017 Date of Consult: 02/18/2017  Primary Care Provider: Patient, No Pcp Per Primary Cardiologist: Non Primary Electrophysiologist:  None   Patient Profile:   Samantha Richards is a 56 y.o. female with a hx of obesity and has not seen medical provider for years.  [name]  is being seen today for the evaluation of atrial fibrillation with RVR and RV overload/cor pulmonale at the request of Dr. Lake Bells, Critical Care .  History of Present Illness:   Samantha Richards has not sought medical care in years and does not have a known past medical history aside from obesity. She denies ETOH, drug use and is a never smoker.  Admitted on 02/16/2017 with generalized weakness. She had a fall two weeks ago falling on her right side causing right knee pain.  EMS had been called out on day 1 but she refused medical care. She called EMS 3 days later and was found immobile in urine and feces in her air mattress. She reports not calling EMS sooner because she couldn't get to a phone due to weakness.  ER Course: Multiple areas of skin break down and a hemoglobin of 3.7, EKG- low voltage and tachycardia. Chest xray + for mod/severe cardiomegaly and central pulmonary vascular congestion. Signs significant for mild CHF/volume overload. BNP > 900. She did have chest pain. Started on IV broad spectrum antibiotics and admitted to critical care.  Hospital Course: [name] was admitted with critical anemia in the setting of a non brisk GI bleed, and shock s/p blood transfusion. Likely due from AKI and systolic heart failure with RV overload. She is on vasopressor support for mixed hypovolemic (hemorrhagic) and cardiogenic chock. Central line placed to measure CVP: no values available yet. Developed atrial fibrillation with RVR yesterday, started on amiodarone.  On  levophed and norepi for hemodynamic support. Tele HR 100s, atrial  flutter. sBP 130  Echo 6/24 -- LVEF 30-35%, RV mod dilated, severe hypokineses, LA dilated, mild MR, RA mod dilated. CT abd/pelv-- notable ascities, hepatic steatosis, large uterine fibroids vs peritoneal mets. Troponin-- Neg x 4  S/sx and results consistent diabetes and sleep apnea noted this admission. Patient is currently awake, alert and oriented x 3     History reviewed. No pertinent past medical history.  History reviewed. No pertinent surgical history.   Inpatient Medications: Scheduled Meds: . furosemide  40 mg Intravenous Once  . insulin aspart  0-15 Units Subcutaneous Q4H  . mouth rinse  15 mL Mouth Rinse BID  . multivitamin with minerals  1 tablet Oral Daily  . thiamine  100 mg Intravenous Daily   Continuous Infusions: . sodium chloride     Followed by  . sodium chloride Stopped (02/16/17 1518)  . sodium chloride    . sodium chloride    . amiodarone 30 mg/hr (02/18/17 0745)  . cefTRIAXone (ROCEPHIN)  IV Stopped (02/18/17 0803)  . DOBUTamine Stopped (02/18/17 1020)  . norepinephrine (LEVOPHED) Adult infusion 9 mcg/min (02/18/17 1059)  . pantoprozole (PROTONIX) infusion 8 mg/hr (02/18/17 0745)  . phenylephrine (NEO-SYNEPHRINE) Adult infusion Stopped (02/18/17 1052)  . vancomycin     PRN Meds: sodium chloride, guaiFENesin-dextromethorphan, iopamidol, lip balm, ondansetron (ZOFRAN) IV, promethazine  Allergies:    Allergies  Allergen Reactions  . Gentamicin Itching and Rash    Social History:   Social History   Social History  . Marital status: Single    Spouse name:  N/A  . Number of children: N/A  . Years of education: N/A   Occupational History  . Not on file.   Social History Main Topics  . Smoking status: Never Smoker  . Smokeless tobacco: Never Used  . Alcohol use No  . Drug use: No  . Sexual activity: Not on file   Other Topics Concern  . Not on file   Social History Narrative  . No narrative on file    Family History:   The  patient's family history is not on file.  ROS:  Please see the history of present illness.  All other ROS reviewed and negative.     Physical Exam/Data:   Vitals:   02/18/17 0500 02/18/17 0530 02/18/17 0600 02/18/17 0630  BP: 97/68 (!) 91/58 102/60 (!) 96/57  Pulse: (!) 115  (!) 114   Resp: 18  16   Temp: 97.7 F (36.5 C)  97.7 F (36.5 C)   TempSrc:      SpO2: 100%  100%   Weight:      Height:        Intake/Output Summary (Last 24 hours) at 02/18/17 1113 Last data filed at 02/18/17 0600  Gross per 24 hour  Intake          2760.43 ml  Output              640 ml  Net          2120.43 ml   Filed Weights   02/16/17 2227 02/18/17 0340  Weight: 237 lb 10.5 oz (107.8 kg) 250 lb 3.6 oz (113.5 kg)   Body mass index is 41.64 kg/m.  General: Well developed, well nourished, in no acute distress. + anasarca and obese Head: Normocephalic, atraumatic, sclera non-icteric, no xanthomas, nares are without discharge.  Neck: Negative for carotid bruits. + central line in place Lungs: Clear bilaterally to auscultation without wheezes, rales, or rhonchi. Breathing is unlabored. Heart: tachycardic, irregular rhythm Abdomen: swollen. Non tender Msk:  Strength and tone appear normal for age. Extremities: significant swelling and superficial skin break down to all 4 extremities. Neuro: Alert and oriented X 3. No facial asymmetry. No focal deficit. Moves all extremities spontaneously. Psych:  Responds to questions appropriately with a normal affect.  EKG:  The EKG was personally reviewed and demonstrates HR 129, atrial flutter. 02/17/2017  Relevant CV Studies:  2 D ECHO 02/17/2017  - Left ventricle: Septal flattening consistant with elevated RV   pressures. Systolic function was moderately to severely reduced.   The estimated ejection fraction was in the range of 30% to 35%.   Diffuse hypokinesis. - Mitral valve: There was mild regurgitation. - Left atrium: The atrium was mildly  dilated. - Right ventricle: The cavity size was moderately dilated. - Right atrium: The atrium was moderately dilated. - Atrial septum: No defect or patent foramen ovale was identified. - Tricuspid valve: There was moderate-severe regurgitation. - Pericardium, extracardiac: A trivial pericardial effusion was   identified. - Impressions: Suspect PA pressure underestimated by TR velocity   due to RV dysfunction. There is moderate RV enlargement with   severe hypokinesis and signs of significant cor pulmonale.  Impressions:  - Suspect PA pressure underestimated by TR velocity due to RV   dysfunction. There is moderate RV enlargement with severe   hypokinesis and signs of significant cor pulmonale  Bilateral Lower Extremity Venous Duplex Evaluation (02/17/2017)  - No evidence of deep vein or superficial thrombosis involving the   right  lower extremity and left lower extremity. - No evidence of Baker&'s cyst on the right or left.  Laboratory Data:  Chemistry  Recent Labs Lab 02/17/17 0327 02/17/17 1529 02/18/17 0432  NA 142 140 141  K 4.2 4.0 4.0  CL 113* 114* 114*  CO2 17* 18* 18*  GLUCOSE 141* 161* 148*  BUN 59* 62* 65*  CREATININE 1.96* 2.05* 1.99*  CALCIUM 9.0 8.7* 8.6*  GFRNONAA 28* 27* 27*  GFRAA 32* 31* 32*  ANIONGAP 12 8 9      Recent Labs Lab 02/16/17 1421  PROT 6.2*  ALBUMIN 2.8*  AST 30  ALT 22  ALKPHOS 105  BILITOT 2.9*   Hematology  Recent Labs Lab 02/17/17 1529 02/17/17 2136 02/18/17 0432  WBC 20.2* 18.5* 21.2*  RBC 3.04* 2.96* 3.30*  HGB 6.4* 6.8* 7.7*  HCT 21.3* 21.5* 24.5*  MCV 70.1* 72.6* 74.2*  MCH 21.1* 23.0* 23.3*  MCHC 30.0 31.6 31.4  RDW 29.3* 29.2* 28.2*  PLT 227 180 173   Cardiac Enzymes  Recent Labs Lab 02/17/17 0327 02/17/17 1103 02/17/17 1529 02/17/17 2136  TROPONINI <0.03 <0.03 <0.03 <0.03   No results for input(s): TROPIPOC in the last 168 hours.  BNP  Recent Labs Lab 02/16/17 1516  BNP 923.6*    DDimer  No results for input(s): DDIMER in the last 168 hours.  Radiology/Studies:  Ct Abdomen Pelvis Wo Contrast  Result Date: 02/17/2017 CLINICAL DATA:  56 year old female with history of progressive weakness and blood clots per rectum for the past 2-3 weeks. EXAM: CT ABDOMEN AND PELVIS WITHOUT CONTRAST TECHNIQUE: Multidetector CT imaging of the abdomen and pelvis was performed following the standard protocol without IV contrast. COMPARISON:  None. FINDINGS: Comment: Study is limited for detection and characterization of visceral and/or vascular lesions by lack of IV contrast. Lower chest: Trace bilateral pleural effusions lying dependently. Dependent opacities in the lung bases may reflect areas of chronic scarring and/or subsegmental atelectasis. Cardiomegaly with severe right atrial dilatation. Low-attenuation of the intravascular compartment, indicative of anemia. Hepatobiliary: Diffuse low attenuation throughout the liver, indicative of hepatic steatosis. No discrete hepatic lesions are identified on today's noncontrast CT examination. Unenhanced appearance of the gallbladder is unremarkable. Pancreas: No definite pancreatic mass or peripancreatic inflammatory changes noted on today's noncontrast CT examination. Spleen: Unremarkable. Adrenals/Urinary Tract: Unenhanced appearance of the kidneys and adrenal glands bilaterally is unremarkable. No hydroureteronephrosis. Urinary bladder is nearly completely decompressed around an indwelling Foley catheter. Stomach/Bowel: Unenhanced appearance of the stomach is normal. There is no pathologic dilatation of small bowel or colon. Numerous appendicoliths are noted within the appendix. The appendix is not appear to be dilated. Vascular/Lymphatic: Minimal atherosclerotic calcifications are noted in the pelvic vasculature. No definite lymphadenopathy identified in the abdomen or pelvis on today's noncontrast CT examination. Reproductive: Uterus is markedly enlarged measuring  approximately 11.3 x 16.3 x 15.8 cm. There are numerous uterine lesions which presumably reflect multifocal fibroids, the largest of which is partially exophytic in the fundal portion on the right side measuring up to 10.1 cm in diameter. Adjacent to the left side of the uterine fundus extending cephalad there are several soft tissue attenuation lesions, largest of which measures 5.7 x 4.9 cm (axial image 59 of series 2) which could simply reflect exophytic subserosal fibroids, however, the possibility of peritoneal implants are not excluded. Ovaries are poorly visualized on today's noncontrast CT examination. Other: Moderate to large volume of ascites. No definite pneumoperitoneum. Diffuse body wall edema (severe). Musculoskeletal: There are no aggressive  appearing lytic or blastic lesions noted in the visualized portions of the skeleton. IMPRESSION: 1. Findings compatible with a state of anasarca, including small bilateral pleural effusions, moderate to large volume of ascites, and diffuse severe body wall edema. 2. Probable fibroid uterus. Some of these fibroids appear to be pedunculated, particularly in the left side of the uterine fundus. Alternatively, the possibility of peritoneal implants in the setting of metastatic disease is not excluded. Cytologic analysis of this ascites is suggested to exclude the possibility of malignant cells. 3. Cardiomegaly with severe right atrial dilatation. 4. Atherosclerosis. 5. Low-attenuation of the intravascular compartment, indicative of anemia. 6. Additional incidental findings, as above. Electronically Signed   By: Vinnie Langton M.D.   On: 02/17/2017 15:42   Dg Chest Port 1 View  Result Date: 02/18/2017 CLINICAL DATA:  Status post central line placement EXAM: PORTABLE CHEST 1 VIEW COMPARISON:  02/16/2017 FINDINGS: Cardiac shadow remains enlarged. Mild vascular congestion is again identified. New left jugular line is noted without evidence of pneumothorax. Catheter  tip is at the junction of the innominate veins and could be advanced further into the superior vena cava. No other focal abnormality is noted. IMPRESSION: Stable appearance of the chest. Recent central line placement on the left with the tip at the junction of the innominate veins. This could be advanced several cm into the cavoatrial junction as necessary. These results were called by telephone at the time of interpretation on 02/18/2017 at 9:53 am to [facility], the patient's nurse who verbally acknowledged these results. Electronically Signed   By: Inez Catalina M.D.   On: 02/18/2017 09:54   Dg Chest Port 1 View  Result Date: 02/16/2017 CLINICAL DATA:  Shortness of breath, possible fall 2 weeks ago. EXAM: PORTABLE CHEST 1 VIEW COMPARISON:  None. FINDINGS: Cardiomegaly, moderate two severe. Upper mediastinal contours, including aortic arch, difficult to characterize due to patient positioning. Central pulmonary vascular congestion and probable mild perihilar edema. No pneumothorax seen. No acute or suspicious osseous finding. IMPRESSION: 1. Cardiomegaly, moderate to severe degree. 2. Central pulmonary vascular congestion and probable mild perihilar interstitial edema suggesting mild CHF/volume overload. Electronically Signed   By: Franki Cabot M.D.   On: 02/16/2017 14:55   Dg Knee Complete 4 Views Right  Result Date: 02/16/2017 CLINICAL DATA:  Right knee pain, possible fall 2 weeks ago. EXAM: RIGHT KNEE - COMPLETE 4+ VIEW COMPARISON:  None. FINDINGS: No evidence of fracture, dislocation, or joint effusion. No evidence of arthropathy or other focal bone abnormality. Soft tissues are unremarkable. IMPRESSION: Negative. Electronically Signed   By: Franki Cabot M.D.   On: 02/16/2017 14:53    Assessment and Plan:   1. Atrial Flutter: CHADVASC 3 (female/1, CHF/1, diabetes/1) -- New onset atrial flutter, CHADVASCof 3, not a candidate for anticoagulation given her profound anemia and active GI bleed. --HR is  currently 100s on Amiodarone drip and on Neosyn and Levophed. Will allow time for her to autoconvert on Amiodarone drip, no changes to this medication at this time.  2. Cor [place]/Right heart failure: No past medical history available patient has not seen medical provider in years. EF: 30-35% this admission, RV mod dilated, severe hypokineses, LA dilated, mild MR, RA mod dilated. She has significant anasarca, ascites, and is third spacing with an albumin level at 2.8 (malnutrition). Her sBP is 130 and creatinine is 1.99. -- agree with diuresis, the plan for Ms. Germer, is largely supportive at this time.  3. Chest pain: chest pain likely related to  being immobile laying on her side for 3 days, troponin x 4.  4. Anemia: low hemoglobin, she likely is anemia at baseline from GI bleed, Uterine fibroids/heavy menstruation    Signed, Linus Mako, PA-C  02/18/2017 11:13 AM   Attending Note:   The patient was seen and examined.  Agree with assessment and plan as noted above.  Changes made to the above note as needed.  Patient seen and independently examined with Delos Haring, PA .   We discussed all aspects of the encounter. I agree with the assessment and plan as stated above.  1.   Acute systolic CHF:  EF noted to be 35% by echo Has total body anasarca - especially on her right side.   Unclear reason for this - perhaps longstanding and untreated HTN, may be rate related if she has had this atrial fib / flutter for a prolonged time   Continue supportive care. Needs diuresis  2.  Cor Pulmonale:  Has untreated OSA and is obese.   Needs CPAP, diuresis  3.  Anemia:   S/p transfusion.  4.  Atrial flutter:   With RVR. Continue amio for rate control.   She is on pressors and has been hypotensive. Would try to taper the Levo as tolerated to minimize tachycardia Continue amio    I have spent a total of 40 minutes with patient reviewing hospital  notes , telemetry, EKGs, labs and  examining patient as well as establishing an assessment and plan that was discussed with the patient. > 50% of time was spent in direct patient care.    Thayer Headings, Brooke Bonito., MD, [facility] 02/18/2017, 11:52 AM [address]  [place] Pager [phone]

## 2017-02-18 NOTE — Consult Note (Signed)
[facility] Nurse wound consult note Reason for Consult: Patient had fallen and was not able to turn and reposition or get up for several days.  She was incontinent of urine and feces, skin is severely denuded on the posterior thighs, medial thighs, bilateral buttocks and right lateral and posterior foot. Blisters have ruptured and reveal partial thickness tissue loss that is painful. Medial thighs, abdomen, bilateral breasts are firm. No pressure injuries are evident today, but they may present over the next few days. Patient is on a mattress replacement with low air loss feature.  She was resistant to turning and repositioning last evening due to discomfort per the bedside RN.Marland Kitchen Wound type: IAD severe, ITD. Pressure Injury POA: No Measurement: Affected area on the bilateral buttocks measures 18cm x 30cm with partial thickness tissue loss scattered throughout the area measuring 0.1cm depth with small amounts of bleeding. Right lateral and posterior foot with 15cm x 15cm area of maceration with 5x5cm area of 0.1cm tissue loss in the center. Moderate amount of serous exudate. Wound bed: As described above Drainage (amount, consistency, odor) As described above Periwound: As described above.  Note areas of firmness and maceration Dressing procedure/placement/frequency: Patient is turned and repositioned for examination, she is calling out and uncomfortable. She is attempting to scratch and rub the affected arfea and discouraged from doing that.  She is taught that the only way this tissue will resurface to to have it covered with a moisture barrier ointment and to avoid pressure or friction from causing additional tissue trauma. While she understands, she seems to not be able to stop herself from scratching the denuded tissue. She is on a therapeutic mattress, but her width from side to side leaves her little room to turn and reposition.  For this reason, I will add a bariatric mattress.  I also feel as if she may be  able to life herself if she has a trapeze and have ordered that as well. Enhanced nutrition and mobility (in addition to aggressive moisture management) will expedite tissue repair. [facility] nursing team will not follow, but will remain available to this patient, the nursing and medical teams.  Please re-consult if needed. Thanks, Maudie Flakes, MSN, RN, [place], Arther Abbott  Pager# [phone]

## 2017-02-18 NOTE — Progress Notes (Addendum)
RN heard pt moaning and upon entering room found pt to be moaning and stating "take this mask off." RN removed CPAP mask and asked pt if she could wear CPAP a few more hours, or if pt would allow the RT to adjust CPAP settings so that pt could tolerate wearing mask. Pt refused CPAP at this time. RN notified RT. Pt placed on 2L [place]. Will continue to monitor.

## 2017-02-18 NOTE — Progress Notes (Addendum)
Nutrition Follow-up  DOCUMENTATION CODES:   Obesity unspecified  INTERVENTION:  - Continue to encourage PO intakes. - Diet advancement as medically feasible. - RD will continue to monitor for nutrition-related needs and appropriate interventions.   NUTRITION DIAGNOSIS:   Inadequate oral intake related to acute illness, poor appetite as evidenced by per patient/family report. -ongoing  GOAL:   Patient will meet greater than or equal to 90% of their needs -unmet  MONITOR:   PO intake, Diet advancement, Weight trends, Labs, I & O's  REASON FOR ASSESSMENT:   Consult  (Suspected malnutrition)  ASSESSMENT:   56 year old female with PMH of Obesity presents to ED on 6/24 with complaints of weakness and pain for the last 3 days. 2 weeks prior had a mechanical fall which resulted in right knee pain. Patient reports that she has not had anything to eat or drink as she has been laying in bed for 3 days unable to move, reports she lives with a roommate however they refused to help. EMS arrived to house on 6/21 and found patient laying face down on an air-mattress, however refused help. EMS reports that living situation was poor and house was in un-livable conditions.  6/26 Pt remains on CLD at this time. She had ultrasound-guided paracentesis this afternoon with 2.5L removed (max ordered for removal). Pt reports that she feels that liquids go down esophagus and into stomach better following fluid removal. She is having pain in legs but no complaints of abdominal pain/pressure or nausea at this time. She is very hopeful for diet advancement and wants a chef salad with a lot of tomatoes and all items finely chopped so it takes less energy to chew and swallow. Let RN know of pt's request. RN reports that pt had bloody emesis with all PO intakes yesterday. Unsure when/if diet will be advanced and what diet will be order upon advancement.   Weight +5.7 kg from admission but unsure if this is related  to fluid, change in scales, or other factors. Will continue to monitor weight trends closely now that paracentesis done. Physical assessment again shows no muscle or fat wasting. Remain unable to state malnutrition based on ASPEN guidelines but will continue to monitor and document additional information throughout hospitalization.   Will monitor intakes, diet advancement, and will order snacks and/or oral nutrition supplements if needed at follow-up to enable adequate nutrition.   Medications reviewed; 1 mg IV folic acid x1 dose today, 40 mg IV Lasix x1 dose 6/24, sliding scale Novolog, daily multivitamin with minerals, 8 mg/hr IV Protonix x72 hours starting 6/25 at 1130, 100 mg IV thiamine/day. Labs reviewed; CBGs: 148 and 129 mg/dL today, Cl: 114 mmol/L, BUN: 65 mg/dL, creatinine: 1.99 mg/dL, Ca: 8.6 mg/dL, GFR: 32 mL/min.    6/25 - Pt was NPO until 9:50 AM when diet was advanced to CLD.  - CLD tray untouched in front of pt at time of RD visit.  - Prior to tray delivery she had taken a few quick sips of juice and felt nauseated, had a very small amount of emesis (RN note states 55mL of bloody emesis).  - Pt states that she did not have anything to eat or drink for 3 days PTA.  - She is hopeful that she will be able to consume something more slowly without N/V later today.  - Will not order oral nutrition supplements at this time but may at follow-up dependent on intakes and symptoms at that time.  - Physical  assessment shows no muscle or fat wasting although this may be masked by severe edema throughout upper and lower body. - Pt unsure of UBW or weight recently. No previous weight hx available in the chart. - Unable to state malnutrition based on information currently available.   Diet Order:  Diet clear liquid Room service appropriate? Yes; Fluid consistency: Thin  Skin:  Wound (see comment) (Stage 2 bilateral buttocks pressure injuries)  Last BM:  6/26  Height:   Ht Readings from  Last 1 Encounters:  02/16/17 5\' 5"  (1.651 m)    Weight:   Wt Readings from Last 1 Encounters:  02/18/17 250 lb 3.6 oz (113.5 kg)    Ideal Body Weight:  56.82 kg  BMI:  Body mass index is 41.64 kg/m.  Estimated Nutritional Needs:   Kcal:  1620-1940 (15-18 kcal/kg)  Protein:  90-100 grams  Fluid:  per MD given severe edema, GIB  EDUCATION NEEDS:   No education needs identified at this time    Jarome Matin, MS, RD, LDN, CNSC Inpatient Clinical Dietitian Pager # [phone] After hours/weekend pager # [phone]

## 2017-02-19 DIAGNOSIS — I5043 Acute on chronic combined systolic (congestive) and diastolic (congestive) heart failure: Secondary | ICD-10-CM | POA: Diagnosis present

## 2017-02-19 DIAGNOSIS — E43 Unspecified severe protein-calorie malnutrition: Secondary | ICD-10-CM

## 2017-02-19 DIAGNOSIS — I5022 Chronic systolic (congestive) heart failure: Secondary | ICD-10-CM

## 2017-02-19 DIAGNOSIS — I484 Atypical atrial flutter: Secondary | ICD-10-CM | POA: Diagnosis present

## 2017-02-19 LAB — GLUCOSE, CAPILLARY
GLUCOSE-CAPILLARY: 122 mg/dL — AB (ref 65–99)
GLUCOSE-CAPILLARY: 149 mg/dL — AB (ref 65–99)
Glucose-Capillary: 136 mg/dL — ABNORMAL HIGH (ref 65–99)
Glucose-Capillary: 151 mg/dL — ABNORMAL HIGH (ref 65–99)
Glucose-Capillary: 151 mg/dL — ABNORMAL HIGH (ref 65–99)
Glucose-Capillary: 150 mg/dL — ABNORMAL HIGH (ref 65–99)

## 2017-02-19 LAB — CULTURE, BLOOD (ROUTINE X 2): Special Requests: ADEQUATE

## 2017-02-19 LAB — BASIC METABOLIC PANEL
Anion gap: 9 (ref 5–15)
BUN: 62 mg/dL — AB (ref 6–20)
CHLORIDE: 113 mmol/L — AB (ref 101–111)
CO2: 18 mmol/L — AB (ref 22–32)
CREATININE: 1.97 mg/dL — AB (ref 0.44–1.00)
Calcium: 8.6 mg/dL — ABNORMAL LOW (ref 8.9–10.3)
GFR calc non Af Amer: 28 mL/min — ABNORMAL LOW (ref >60)
GFR, EST AFRICAN AMERICAN: 32 mL/min — AB (ref >60)
GLUCOSE: 140 mg/dL — AB (ref 65–99)
Potassium: 3.8 mmol/L (ref 3.5–5.1)
Sodium: 140 mmol/L (ref 135–145)

## 2017-02-19 LAB — CBC WITH DIFFERENTIAL/PLATELET
BASOS PCT: 0 %
Basophils Absolute: 0 10*3/uL (ref 0.0–0.1)
EOS PCT: 1 %
Eosinophils Absolute: 0.2 10*3/uL (ref 0.0–0.7)
HEMATOCRIT: 26.9 % — AB (ref 36.0–46.0)
HEMOGLOBIN: 8.4 g/dL — AB (ref 12.0–15.0)
LYMPHS ABS: 1.7 10*3/uL (ref 0.7–4.0)
LYMPHS PCT: 8 %
MCH: 23.1 pg — AB (ref 26.0–34.0)
MCHC: 31.2 g/dL (ref 30.0–36.0)
MCV: 73.9 fL — AB (ref 78.0–100.0)
MONOS PCT: 6 %
Monocytes Absolute: 1.3 10*3/uL — ABNORMAL HIGH (ref 0.1–1.0)
NEUTROS ABS: 18.3 10*3/uL — AB (ref 1.7–7.7)
Neutrophils Relative %: 85 %
Platelets: 206 10*3/uL (ref 150–400)
RBC: 3.64 MIL/uL — ABNORMAL LOW (ref 3.87–5.11)
RDW: 28.8 % — AB (ref 11.5–15.5)
WBC: 21.5 10*3/uL — ABNORMAL HIGH (ref 4.0–10.5)

## 2017-02-19 LAB — MAGNESIUM: Magnesium: 2.3 mg/dL (ref 1.7–2.4)

## 2017-02-19 LAB — PROTEIN, PLEURAL OR PERITONEAL FLUID: Total protein, fluid: <3 g/dL

## 2017-02-19 LAB — ACID FAST SMEAR (AFB)

## 2017-02-19 LAB — ACID FAST SMEAR (AFB, MYCOBACTERIA): Acid Fast Smear: NEGATIVE

## 2017-02-19 LAB — HAPTOGLOBIN: HAPTOGLOBIN: 229 mg/dL — AB (ref 34–200)

## 2017-02-19 MED ORDER — LEVOTHYROXINE SODIUM 100 MCG PO TABS
100.0000 ug | ORAL_TABLET | Freq: Every day | ORAL | Status: DC
  Administered 2017-02-21 – 2017-03-04 (×11): 100 ug via ORAL
  Filled 2017-02-19 (×13): qty 1

## 2017-02-19 NOTE — Progress Notes (Addendum)
PULMONARY / CRITICAL CARE MEDICINE   Name: Samantha Richards MRN: [id number] DOB: 1961-04-06    ADMISSION DATE:  02/16/2017 CONSULTATION DATE:  02/16/2017  REFERRING MD:  Nils Flack, PA   CHIEF COMPLAINT:  Anemia   BRIEF 56 y/o female with obesity admitted on 6/25 with anemia after a fall.  Noted some bloody stool for 2-3 weeks, but says it was only a few clots per day.  Noted to have a Hgb of 3 on admission.   SUBJECTIVE:  Had 2.5 L fluid removed yesterday with paracentesis Feels better today Wants to eat more No bleeding  VITAL SIGNS: BP 100/79   Pulse (!) 109   Temp 97.9 F (36.6 C) (Axillary)   Resp (!) 27   Ht 5\' 5"  (1.651 m)   Wt 246 lb 11.1 oz (111.9 kg)   LMP 01/16/2017 Comment: neg preg 02-16-2017  SpO2 98%   BMI 41.05 kg/m   HEMODYNAMICS: CVP:  [7 mmHg-8 mmHg] 8 mmHg  VENTILATOR SETTINGS:    INTAKE / OUTPUT: I/O last 3 completed shifts: In: 4203.1 [P.O.:240; I.V.:3515.6; Blood:347.5; IV Piggyback:100] Out: 1485 [Urine:1485]  PHYSICAL EXAMINATION:  General:  Resting comfortably in bed HENT: NCAT OP clear PULM: CTA B, normal effort CV: RRR, no mgr GI: BS+, soft, nontender MSK: normal bulk and tone Neuro: awake, alert, no distress, MAEW Derm: massive edema legs/hips   LABS:  BMET  Recent Labs Lab 02/17/17 1529 02/18/17 0432 02/19/17 0424  NA 140 141 140  K 4.0 4.0 3.8  CL 114* 114* 113*  CO2 18* 18* 18*  BUN 62* 65* 62*  CREATININE 2.05* 1.99* 1.97*  GLUCOSE 161* 148* 140*    Electrolytes  Recent Labs Lab 02/17/17 0327 02/17/17 1529 02/18/17 0432 02/19/17 0424  CALCIUM 9.0 8.7* 8.6* 8.6*  MG 2.1  --   --  2.3  PHOS 4.4  --   --   --     CBC  Recent Labs Lab 02/18/17 0432 02/18/17 2011 02/19/17 0424  WBC 21.2* 24.4* 21.5*  HGB 7.7* 8.5* 8.4*  HCT 24.5* 27.3* 26.9*  PLT 173 208 206    Coag's  Recent Labs Lab 02/16/17 1421 02/18/17 1415  APTT 35  --   INR 1.40 1.39    Sepsis Markers  Recent Labs Lab  02/16/17 1439 02/17/17 0327  LATICACIDVEN 2.46* 1.4    ABG No results for input(s): PHART, PCO2ART, PO2ART in the last 168 hours.  Liver Enzymes  Recent Labs Lab 02/16/17 1421 02/18/17 1415  AST 30 19  ALT 22 18  ALKPHOS 105 95  BILITOT 2.9* 2.3*  ALBUMIN 2.8* 2.7*    Cardiac Enzymes  Recent Labs Lab 02/17/17 1103 02/17/17 1529 02/17/17 2136  TROPONINI <0.03 <0.03 <0.03    Glucose  Recent Labs Lab 02/18/17 1316 02/18/17 1726 02/18/17 2057 02/18/17 2343 02/19/17 0409 02/19/17 0825  GLUCAP 152* 167* 139* 157* 136* 122*    Imaging US Paracentesis  Result Date: 02/18/2017 INDICATION: CHF, cor pulmonale, anemia, acute kidney injury, GI bleed, ascites. Request made for diagnostic and therapeutic paracentesis. EXAM: ULTRASOUND GUIDED DIAGNOSTIC AND THERAPEUTIC PARACENTESIS MEDICATIONS: None. COMPLICATIONS: None immediate. PROCEDURE: Informed written consent was obtained from the patient after a discussion of the risks, benefits and alternatives to treatment. A timeout was performed prior to the initiation of the procedure. Initial ultrasound scanning demonstrates a moderate amount of ascites within the left mid to lower abdominal quadrant. The left mid to lower abdomen was prepped and draped in the usual sterile fashion. 1%  lidocaine was used for local anesthesia. Following this, a Yueh catheter was introduced. An ultrasound image was saved for documentation purposes. The paracentesis was performed. The catheter was removed and a dressing was applied. The patient tolerated the procedure well without immediate post procedural complication. FINDINGS: A total of approximately 2.5 liters of clear, yellow fluid was removed. Samples were sent to the laboratory as requested by the clinical team. Due to history of renal insufficiency only the above amount of fluid was removed at this time. IMPRESSION: Successful ultrasound-guided diagnostic and therapeutic paracentesis yielding 2.5  liters of peritoneal fluid. Read by: Rowe Robert, PA-C Electronically Signed   By: Sandi Mariscal M.D.   On: 02/18/2017 13:06   Dg Chest Port 1 View  Result Date: 02/18/2017 CLINICAL DATA:  Status post central line placement EXAM: PORTABLE CHEST 1 VIEW COMPARISON:  02/16/2017 FINDINGS: Cardiac shadow remains enlarged. Mild vascular congestion is again identified. New left jugular line is noted without evidence of pneumothorax. Catheter tip is at the junction of the innominate veins and could be advanced further into the superior vena cava. No other focal abnormality is noted. IMPRESSION: Stable appearance of the chest. Recent central line placement on the left with the tip at the junction of the innominate veins. This could be advanced several cm into the cavoatrial junction as necessary. These results were called by telephone at the time of interpretation on 02/18/2017 at 9:53 am to [facility], the patient's nurse who verbally acknowledged these results. Electronically Signed   By: Inez Catalina M.D.   On: 02/18/2017 09:54     STUDIES:  CXR 6/24 > Cardiomegaly, moderate to severe degree, central vascular pulmonary congestion and probable mild perihilar interstitial edema suggesting mild CHF/volume overload  XR right Knee 6/24 > No Acute ECHO 6/24 >>  LVEF 30-35%, RV mod dilated, severe hypokinesis, LA dilated, mild MR, RA mod dilated CT abdomen/pelvis 6/26> notable ascites, hepatic steatosis, large uterine fibroids vs peritoneal mets Paracentesis 6/26 > 2.5 L fluid removed  CULTURES: Blood 6/24 >> coag neg staph 2/4  ANTIBIOTICS: Vancomycin 6/24 >  Rocephin 6/24 >>  SIGNIFICANT EVENTS: 6/24 > Presents to ED   LINES/TUBES: PIV   DISCUSSION: 56 y/o female admitted with critical anemia in the setting of non-brisk GI bleeding, found to have shock, presumed AKI and systolic heart failure with RV overload.  Has required vasopressor support for presumed mixed hypovolemic (hemorrhagic) and  cardiogenic shock.  Noted on CT abdomen to have large uterine fibroids and ascites.  ASSESSMENT / PLAN:  PULMONARY A: Cough, chest congestion Probable sleep apnea, intolerant of CPAP in hospital P:   Continue robitussin DM Monitor O2 saturation Will need outpatient sleep study Hold of on using CPAP for now  CARDIOVASCULAR A:  Cardiogenic shock > improved with blood administration and rate control Acute systolic heart failure, ddx ischemic vs non-ischemic (nutritional related?) Afib with RVR > better RV overload/cor pulmonale P:  Tele Continue CVP monitoring For cardiology: do we need to consider left and right heart cath ? Continue amiodarone for now Goal MAP with levophed > 65 Diet: Fluid and sodium restriction  RENAL A:   Acute Kidney Injury, presumed (unclear baseline) P:   KVO fluids Monitor BMET and UOP Replace electrolytes as needed   GASTROINTESTINAL A:   Severe protein-calorie malnutrition: [place]; prealbumin < 5, history supports poor po intake for months Slow GI bleeding Some upper gi bleeding with vomiting Hepatic steatosis Ascites Peritoneal mets vs uterine fibroids P:   Continue PPI  drip Endoscopy this week Follow up Paracentesis results, appreciate GI support Nutrition consult, advance diet today, assess nutritional status Continue thiamine, folic acid  HEMATOLOGIC A:   Anemia, bleeding appears to be out of proportion to blood per stool Little evidence of hemoylsis: Coombs neg, haptoglobin wnl P:  Maintain Hgb > 7gm/dL Evaluate for hemolysis> extravascular?   INFECTIOUS A:   Skin breakdown/wounds present on admission Cellulitis Probable blood culture contamination (staph epi 2/4 cultures) P:   Continue vanc for now, consider stopping 6/28 if improved Continue ceftriaxone Monitor blood cultures Wound care  ENDOCRINE A:   Mild hyperglycemia Hypothyroid > TSH 11.7 P:   Trend Glucose  Start synthroid SSI> target  140-180  NEUROLOGIC A:   Muscular deconditioning P:   PT consult today   FAMILY  - Updates: no family present  - Inter-disciplinary family meet or Palliative Care meeting due by:  02/23/2017   Roselie Awkward, MD [place] [facility] Pager: [phone] Cell: [phone] After 3pm or if no response, call [phone]   02/19/2017, 9:15 AM

## 2017-02-19 NOTE — Progress Notes (Signed)
[facility] Gastroenterology Progress Note  Samantha Richards 56 y.o. 1960/12/25  CC:  Anemia, GI bleed   Subjective: Patient is status post paracentesis. Feeling much better now. Overall she looks better today. She denied any further bleeding episodes. Abdominal distention is improving. All rating diet  ROS : Negative for chest pain and shortness of breath   Objective: Vital signs in last 24 hours: Vitals:   02/19/17 0600 02/19/17 0700  BP: 100/79   Pulse: (!) 109   Resp: (!) 27   Temp: 98.2 F (36.8 C) 97.9 F (36.6 C)    Physical Exam:  General:  Alert, cooperative, no distress, appears stated age  Head:  Normocephalic, without obvious abnormality, atraumatic  Eyes:  , EOM's intact,   Lungs:   Bilateral crackles on exam. Anterior exam only. No  distress   Heart:  Regular rate and rhythm, S1, S2 normal  Abdomen:   Abdomen remains mildly Distended with the lower quadrant anasarca, bowel sounds present. No peritoneal signs.   Extremities: 3+ lower Ext edema        Lab Results:  Recent Labs  02/17/17 0327  02/18/17 0432 02/19/17 0424  NA 142  < > 141 140  K 4.2  < > 4.0 3.8  CL 113*  < > 114* 113*  CO2 17*  < > 18* 18*  GLUCOSE 141*  < > 148* 140*  BUN 59*  < > 65* 62*  CREATININE 1.96*  < > 1.99* 1.97*  CALCIUM 9.0  < > 8.6* 8.6*  MG 2.1  --   --  2.3  PHOS 4.4  --   --   --   < > = values in this interval not displayed.  Recent Labs  02/16/17 1421 02/18/17 1415  AST 30 19  ALT 22 18  ALKPHOS 105 95  BILITOT 2.9* 2.3*  PROT 6.2* 6.0*  ALBUMIN 2.8* 2.7*    Recent Labs  02/18/17 2011 02/19/17 0424  WBC 24.4* 21.5*  NEUTROABS 20.5* 18.3*  HGB 8.5* 8.4*  HCT 27.3* 26.9*  MCV 74.2* 73.9*  PLT 208 206    Recent Labs  02/16/17 1421 02/18/17 1415  LABPROT 17.2* 17.2*  INR 1.40 1.39      Assessment/Plan: - Symptomatic anemia with hemoglobin of 3.7 on admission, occult blood negative on initial evaluation.  - Hematemesis. Resolved - History of  bright blood per rectum as well as dark stools a few days prior to admission - Acute blood loss anemia.  Status post blood transfusion - Ascites. Status post paracentesis yesterday. SAAG >1.1. Negative for SBP. - Congestive heart failure with EF of 30%. - Leukocytosis with possible underlying sepsis - Acute kidney injury - Shortness of breath probably from CHF and anemia - Hypotension/ cardiogenic shock - Anasarca  Recommendations -------------------------- - I'll order protein count for the ascites fluid which was drawn yesterday. Discussed with the lab. Order place. Differential diagnosis of ascites at this point would be related to portal hypertension such as cardiac versus nephrotic syndrome versus cirrhosis  - Case discussed with critical care attending Dr. Lake Bells. Okay to proceed with EGD tomorrow. We will plan for EGD tomorrow. Risks benefits alternatives discussed with the patient. If EGD is negative, may consider colonoscopy on Friday.   - Monitor H&H. Continue PPI. Patient has normal LFTs. Normal platelet counts. CT scan showed no cirrhosis. - GI will follow   [name] MD, [place] 02/19/2017, 9:24 AM  Pager [phone]  If no answer or after 5 PM  call [phone]

## 2017-02-19 NOTE — Progress Notes (Signed)
Progress Note  Patient Name: Samantha Richards Date of Encounter: 02/19/2017  Primary Cardiologist: None  Subjective   Had a paracentesis done yesterday with 2.5 liters of fluid removed.-- noticeable improvement of anasarca. Continues to be in atrial flutter HR 70-115. BP 92/66 On Levophed and Amio drip  Inpatient Medications    Scheduled Meds: . Chlorhexidine Gluconate Cloth  6 each Topical Daily  . furosemide  40 mg Intravenous Once  . insulin aspart  0-15 Units Subcutaneous Q4H  . mouth rinse  15 mL Mouth Rinse BID  . multivitamin with minerals  1 tablet Oral Daily  . sodium chloride flush  10-40 mL Intracatheter Q12H  . thiamine  100 mg Intravenous Daily   Continuous Infusions: . sodium chloride     Followed by  . sodium chloride Stopped (02/16/17 1518)  . sodium chloride    . sodium chloride    . amiodarone 30 mg/hr (02/19/17 0712)  . cefTRIAXone (ROCEPHIN)  IV Stopped (02/18/17 0803)  . DOBUTamine Stopped (02/18/17 1020)  . norepinephrine (LEVOPHED) Adult infusion 2 mcg/min (02/19/17 0600)  . pantoprozole (PROTONIX) infusion 8 mg/hr (02/19/17 0600)  . phenylephrine (NEO-SYNEPHRINE) Adult infusion Stopped (02/18/17 1052)  . vancomycin Stopped (02/18/17 1533)   PRN Meds: sodium chloride, guaiFENesin-dextromethorphan, iopamidol, lip balm, ondansetron (ZOFRAN) IV, promethazine, sodium chloride flush   Vital Signs    Vitals:   02/19/17 0500 02/19/17 0506 02/19/17 0541 02/19/17 0600  BP: (!) 86/50 92/66 104/79 100/79  Pulse: (!) 42  (!) 107 (!) 109  Resp: (!) 25   (!) 27  Temp: 98.4 F (36.9 C)   98.2 F (36.8 C)  TempSrc:      SpO2: 100%   98%  Weight:      Height:        Intake/Output Summary (Last 24 hours) at 02/19/17 0758 Last data filed at 02/19/17 0600  Gross per 24 hour  Intake          2170.75 ml  Output              970 ml  Net          1200.75 ml   Filed Weights   02/16/17 2227 02/18/17 0340 02/19/17 0421  Weight: 237 lb 10.5 oz (107.8 kg) 250  lb 3.6 oz (113.5 kg) 246 lb 11.1 oz (111.9 kg)    Telemetry    Atrial flutter, HR 70-110s - Personally Reviewed   Physical Exam   GEN: Well nourished, well developed, + obese HEENT: normal  Neck: no JVD, carotid bruits, or masses Cardiac: irreg irreg. no murmurs, rubs, or gallops.. Intact distal pulses bilaterally. +le swelling Respiratory: clear to auscultation bilaterally, normal work of breathing GI: soft, nontender, nondistended, + BS MS: no deformity or atrophy  Skin: warm and dry, + lower extremity skin wounds Neuro: Alert and Oriented x 3, Strength and sensation are intact Psych:   Full affect  Labs    Chemistry Recent Labs Lab 02/16/17 1421  02/17/17 1529 02/18/17 0432 02/18/17 1415 02/19/17 0424  NA 140  < > 140 141  --  140  K 4.2  < > 4.0 4.0  --  3.8  CL 112*  < > 114* 114*  --  113*  CO2 17*  < > 18* 18*  --  18*  GLUCOSE 119*  < > 161* 148*  --  140*  BUN 52*  < > 62* 65*  --  62*  CREATININE 1.96*  < > 2.05* 1.99*  --  1.97*  CALCIUM 9.1  < > 8.7* 8.6*  --  8.6*  PROT 6.2*  --   --   --  6.0*  --   ALBUMIN 2.8*  --   --   --  2.7*  --   AST 30  --   --   --  19  --   ALT 22  --   --   --  18  --   ALKPHOS 105  --   --   --  95  --   BILITOT 2.9*  --   --   --  2.3*  --   GFRNONAA 28*  < > 27* 27*  --  28*  GFRAA 32*  < > 31* 32*  --  32*  ANIONGAP 11  < > 8 9  --  9  < > = values in this interval not displayed.   Hematology Recent Labs Lab 02/18/17 0432 02/18/17 2011 02/19/17 0424  WBC 21.2* 24.4* 21.5*  RBC 3.30* 3.68* 3.64*  HGB 7.7* 8.5* 8.4*  HCT 24.5* 27.3* 26.9*  MCV 74.2* 74.2* 73.9*  MCH 23.3* 23.1* 23.1*  MCHC 31.4 31.1 31.2  RDW 28.2* 28.5* 28.8*  PLT 173 208 206    Cardiac Enzymes Recent Labs Lab 02/17/17 0327 02/17/17 1103 02/17/17 1529 02/17/17 2136  TROPONINI <0.03 <0.03 <0.03 <0.03   No results for input(s): TROPIPOC in the last 168 hours.   BNP Recent Labs Lab 02/16/17 1516  BNP 923.6*     DDimer No  results for input(s): DDIMER in the last 168 hours.   Radiology    Ct Abdomen Pelvis Wo Contrast  Result Date: 02/17/2017 CLINICAL DATA:  56 year old female with history of progressive weakness and blood clots per rectum for the past 2-3 weeks. EXAM: CT ABDOMEN AND PELVIS WITHOUT CONTRAST TECHNIQUE: Multidetector CT imaging of the abdomen and pelvis was performed following the standard protocol without IV contrast. COMPARISON:  None. FINDINGS: Comment: Study is limited for detection and characterization of visceral and/or vascular lesions by lack of IV contrast. Lower chest: Trace bilateral pleural effusions lying dependently. Dependent opacities in the lung bases may reflect areas of chronic scarring and/or subsegmental atelectasis. Cardiomegaly with severe right atrial dilatation. Low-attenuation of the intravascular compartment, indicative of anemia. Hepatobiliary: Diffuse low attenuation throughout the liver, indicative of hepatic steatosis. No discrete hepatic lesions are identified on today's noncontrast CT examination. Unenhanced appearance of the gallbladder is unremarkable. Pancreas: No definite pancreatic mass or peripancreatic inflammatory changes noted on today's noncontrast CT examination. Spleen: Unremarkable. Adrenals/Urinary Tract: Unenhanced appearance of the kidneys and adrenal glands bilaterally is unremarkable. No hydroureteronephrosis. Urinary bladder is nearly completely decompressed around an indwelling Foley catheter. Stomach/Bowel: Unenhanced appearance of the stomach is normal. There is no pathologic dilatation of small bowel or colon. Numerous appendicoliths are noted within the appendix. The appendix is not appear to be dilated. Vascular/Lymphatic: Minimal atherosclerotic calcifications are noted in the pelvic vasculature. No definite lymphadenopathy identified in the abdomen or pelvis on today's noncontrast CT examination. Reproductive: Uterus is markedly enlarged measuring  approximately 11.3 x 16.3 x 15.8 cm. There are numerous uterine lesions which presumably reflect multifocal fibroids, the largest of which is partially exophytic in the fundal portion on the right side measuring up to 10.1 cm in diameter. Adjacent to the left side of the uterine fundus extending cephalad there are several soft tissue attenuation lesions, largest of which measures 5.7 x 4.9 cm (axial image 59 of series 2) which  could simply reflect exophytic subserosal fibroids, however, the possibility of peritoneal implants are not excluded. Ovaries are poorly visualized on today's noncontrast CT examination. Other: Moderate to large volume of ascites. No definite pneumoperitoneum. Diffuse body wall edema (severe). Musculoskeletal: There are no aggressive appearing lytic or blastic lesions noted in the visualized portions of the skeleton. IMPRESSION: 1. Findings compatible with a state of anasarca, including small bilateral pleural effusions, moderate to large volume of ascites, and diffuse severe body wall edema. 2. Probable fibroid uterus. Some of these fibroids appear to be pedunculated, particularly in the left side of the uterine fundus. Alternatively, the possibility of peritoneal implants in the setting of metastatic disease is not excluded. Cytologic analysis of this ascites is suggested to exclude the possibility of malignant cells. 3. Cardiomegaly with severe right atrial dilatation. 4. Atherosclerosis. 5. Low-attenuation of the intravascular compartment, indicative of anemia. 6. Additional incidental findings, as above. Electronically Signed   By: Vinnie Langton M.D.   On: 02/17/2017 15:42   US Paracentesis  Result Date: 02/18/2017 INDICATION: CHF, cor pulmonale, anemia, acute kidney injury, GI bleed, ascites. Request made for diagnostic and therapeutic paracentesis. EXAM: ULTRASOUND GUIDED DIAGNOSTIC AND THERAPEUTIC PARACENTESIS MEDICATIONS: None. COMPLICATIONS: None immediate. PROCEDURE: Informed  written consent was obtained from the patient after a discussion of the risks, benefits and alternatives to treatment. A timeout was performed prior to the initiation of the procedure. Initial ultrasound scanning demonstrates a moderate amount of ascites within the left mid to lower abdominal quadrant. The left mid to lower abdomen was prepped and draped in the usual sterile fashion. 1% lidocaine was used for local anesthesia. Following this, a Yueh catheter was introduced. An ultrasound image was saved for documentation purposes. The paracentesis was performed. The catheter was removed and a dressing was applied. The patient tolerated the procedure well without immediate post procedural complication. FINDINGS: A total of approximately 2.5 liters of clear, yellow fluid was removed. Samples were sent to the laboratory as requested by the clinical team. Due to history of renal insufficiency only the above amount of fluid was removed at this time. IMPRESSION: Successful ultrasound-guided diagnostic and therapeutic paracentesis yielding 2.5 liters of peritoneal fluid. Read by: Rowe Robert, PA-C Electronically Signed   By: Sandi Mariscal M.D.   On: 02/18/2017 13:06   Dg Chest Port 1 View  Result Date: 02/18/2017 CLINICAL DATA:  Status post central line placement EXAM: PORTABLE CHEST 1 VIEW COMPARISON:  02/16/2017 FINDINGS: Cardiac shadow remains enlarged. Mild vascular congestion is again identified. New left jugular line is noted without evidence of pneumothorax. Catheter tip is at the junction of the innominate veins and could be advanced further into the superior vena cava. No other focal abnormality is noted. IMPRESSION: Stable appearance of the chest. Recent central line placement on the left with the tip at the junction of the innominate veins. This could be advanced several cm into the cavoatrial junction as necessary. These results were called by telephone at the time of interpretation on 02/18/2017 at 9:53 am  to [facility], the patient's nurse who verbally acknowledged these results. Electronically Signed   By: Inez Catalina M.D.   On: 02/18/2017 09:54    Cardiac Studies   Transthoracic Echo (02/17/2017) - Left ventricle: Septal flattening consistant with elevated RV   pressures. Systolic function was moderately to severely reduced.   The estimated ejection fraction was in the range of 30% to 35%.   Diffuse hypokinesis. - Mitral valve: There was mild regurgitation. - Left atrium:  The atrium was mildly dilated. - Right ventricle: The cavity size was moderately dilated. - Right atrium: The atrium was moderately dilated. - Atrial septum: No defect or patent foramen ovale was identified. - Tricuspid valve: There was moderate-severe regurgitation. - Pericardium, extracardiac: A trivial pericardial effusion was   identified. - Impressions: Suspect PA pressure underestimated by TR velocity   due to RV dysfunction. There is moderate RV enlargement with   severe hypokinesis and signs of significant cor pulmonale.  Impressions:  - Suspect PA pressure underestimated by TR velocity due to RV   dysfunction. There is moderate RV enlargement with severe   hypokinesis and signs of significant cor pulmonale.   Patient Profile   Samantha Richards has not sought medical care in years and does not have a known past medical history aside from obesity. She denies ETOH, drug use and is a never smoker. She is being seen for the evaluation of atrial fibrillation with RVR and RV overload/cor pulmonale at the request of Dr. Lake Bells, Critical Care .  Assessment & Plan     1. Acute systolic CHF: EF 88% by echo, total body anasarca R > L - Total output is close to 2 liters this admission, she is over all at a net gain + ~4.5 liters. Weight on admission 237 and today is 246.  Did also have 2.5 liters of ascites removed by radiology yesterday. -- creatinine stable 1.97  2. Cor pulmonale: pt intolerant of CPAP, will  need outpatient sleep study. Continue to diurese.  3. Anemia: s/p transfusion:   4. Atrial flutter: w/ RVR, on amiodarone for rate control. Rates 70-115. She is still on Levophed drip. BPs improved but still soft.     SignedLinus Mako, PA-C  02/19/2017, 7:58 AM     Attending Note:   The patient was seen and examined.  Agree with assessment and plan as noted above.  Changes made to the above note as needed.  Patient seen and independently examined with Delos Haring, PA .   We discussed all aspects of the encounter. I agree with the assessment and plan as stated above.  1.  Acute systolic CHF:   Feels better after paracentesis.  EF 30-35%. Continue current medications  2.  Atrial flutter :  HR is better.    I have spent a total of 40 minutes with patient reviewing hospital  notes , telemetry, EKGs, labs and examining patient as well as establishing an assessment and plan that was discussed with the patient. > 50% of time was spent in direct patient care.    Thayer Headings, Brooke Bonito., MD, [facility] 02/19/2017, 3:20 PM [address]  [place] Pager [phone]

## 2017-02-19 NOTE — Progress Notes (Signed)
Pt refused wound care for the back of her thighs and bottom. RN informed pt of importance of wound care and cleanliness to promote skin healing. Pt stated "she didn't feel like doing it and would mentally have to prepare for wound care." Pt also stated "she was worried it would hurt." RN asked the pt multiple times throughout the shift if she could perform wound care on pt thighs and bottom and pt refused. Will continue to monitor.

## 2017-02-19 NOTE — Progress Notes (Signed)
Pt HR alarmed on monitor stating pt was bradycardic, upon reviewing monitor it appeared that the 5 lead EKG was not reading accurately. Upon RN entering pt room, pt appeared pale in the face. Pt mentation unchanged. RN adjusted pt EKG leads to ensure accurate reading. BP rechecked and was 92/66. Pt HR has been 110-130 bpm and is now 70s-115 bpm. RN obtained a 12-Lead EKG to determine if pt rhythm had converted from atrial flutter to NSR. EKG read pt to be in A-flutter with a prolong QTc of 668. Per orders, RN notified E-Link MD of prolonged QTc and notified MD that pt was on an Amiodarone gtt. No new orders received. Will continue to monitor.

## 2017-02-20 ENCOUNTER — Encounter (HOSPITAL_COMMUNITY)
Admission: EM | Disposition: A | Discharge status: Hospice | Payer: Self-pay | Source: Home / Self Care | Attending: Pulmonary Disease

## 2017-02-20 ENCOUNTER — Inpatient Hospital Stay (HOSPITAL_COMMUNITY): Payer: Self-pay | Admitting: Registered Nurse

## 2017-02-20 ENCOUNTER — Encounter (HOSPITAL_COMMUNITY): Payer: Self-pay | Admitting: *Deleted

## 2017-02-20 DIAGNOSIS — I5043 Acute on chronic combined systolic (congestive) and diastolic (congestive) heart failure: Secondary | ICD-10-CM

## 2017-02-20 DIAGNOSIS — I484 Atypical atrial flutter: Secondary | ICD-10-CM

## 2017-02-20 HISTORY — PX: ESOPHAGOGASTRODUODENOSCOPY (EGD) WITH PROPOFOL: SHX5813

## 2017-02-20 LAB — CBC
HCT: 27.1 % — ABNORMAL LOW (ref 36.0–46.0)
Hemoglobin: 8.3 g/dL — ABNORMAL LOW (ref 12.0–15.0)
MCH: 22.7 pg — AB (ref 26.0–34.0)
MCHC: 30.6 g/dL (ref 30.0–36.0)
MCV: 74 fL — ABNORMAL LOW (ref 78.0–100.0)
PLATELETS: 189 10*3/uL (ref 150–400)
RBC: 3.66 MIL/uL — ABNORMAL LOW (ref 3.87–5.11)
RDW: 29.3 % — AB (ref 11.5–15.5)
WBC: 19.3 10*3/uL — ABNORMAL HIGH (ref 4.0–10.5)

## 2017-02-20 LAB — BASIC METABOLIC PANEL
Anion gap: 11 (ref 5–15)
BUN: 59 mg/dL — AB (ref 6–20)
CO2: 18 mmol/L — ABNORMAL LOW (ref 22–32)
CREATININE: 1.89 mg/dL — AB (ref 0.44–1.00)
Calcium: 8.7 mg/dL — ABNORMAL LOW (ref 8.9–10.3)
Chloride: 113 mmol/L — ABNORMAL HIGH (ref 101–111)
GFR calc Af Amer: 34 mL/min — ABNORMAL LOW (ref >60)
GFR, EST NON AFRICAN AMERICAN: 29 mL/min — AB (ref >60)
GLUCOSE: 141 mg/dL — AB (ref 65–99)
Potassium: 3.6 mmol/L (ref 3.5–5.1)
SODIUM: 142 mmol/L (ref 135–145)

## 2017-02-20 LAB — GLUCOSE, CAPILLARY
GLUCOSE-CAPILLARY: 118 mg/dL — AB (ref 65–99)
GLUCOSE-CAPILLARY: 124 mg/dL — AB (ref 65–99)
GLUCOSE-CAPILLARY: 142 mg/dL — AB (ref 65–99)
GLUCOSE-CAPILLARY: 171 mg/dL — AB (ref 65–99)
GLUCOSE-CAPILLARY: 222 mg/dL — AB (ref 65–99)

## 2017-02-20 LAB — LACTIC ACID, PLASMA: Lactic Acid, Venous: 1.6 mmol/L (ref 0.5–1.9)

## 2017-02-20 LAB — COOXEMETRY PANEL
CARBOXYHEMOGLOBIN: 1.4 % (ref 0.5–1.5)
Methemoglobin: 0.7 % (ref 0.0–1.5)
O2 SAT: 60.3 %
TOTAL HEMOGLOBIN: 8.7 g/dL — AB (ref 12.0–16.0)

## 2017-02-20 SURGERY — ESOPHAGOGASTRODUODENOSCOPY (EGD) WITH PROPOFOL
Anesthesia: Monitor Anesthesia Care

## 2017-02-20 MED ORDER — SODIUM CHLORIDE 0.9 % IV SOLN: INTRAVENOUS | Status: DC

## 2017-02-20 MED ORDER — LIDOCAINE 2% (20 MG/ML) 5 ML SYRINGE
INTRAMUSCULAR | Status: DC | PRN
  Administered 2017-02-20: 100 mg via INTRAVENOUS

## 2017-02-20 MED ORDER — PROPOFOL 10 MG/ML IV BOLUS
INTRAVENOUS | Status: AC
  Filled 2017-02-20: qty 40

## 2017-02-20 MED ORDER — FENTANYL CITRATE (PF) 100 MCG/2ML IJ SOLN: 25.0000 ug | INTRAMUSCULAR | Status: DC | PRN

## 2017-02-20 MED ORDER — LACTATED RINGERS IV SOLN: INTRAVENOUS | Status: DC

## 2017-02-20 MED ORDER — PANTOPRAZOLE SODIUM 40 MG PO TBEC
40.0000 mg | DELAYED_RELEASE_TABLET | Freq: Two times a day (BID) | ORAL | Status: DC
  Administered 2017-02-20 – 2017-03-03 (×23): 40 mg via ORAL
  Filled 2017-02-20 (×25): qty 1

## 2017-02-20 MED ORDER — LACTATED RINGERS IV SOLN
INTRAVENOUS | Status: DC | PRN
  Administered 2017-02-20: 13:00:00 via INTRAVENOUS

## 2017-02-20 MED ORDER — PROPOFOL 500 MG/50ML IV EMUL
INTRAVENOUS | Status: DC | PRN
  Administered 2017-02-20: 120 ug/kg/min via INTRAVENOUS

## 2017-02-20 MED ORDER — ONDANSETRON HCL 4 MG/2ML IJ SOLN: 4.0000 mg | Freq: Once | INTRAMUSCULAR | Status: DC | PRN

## 2017-02-20 MED ORDER — MEPERIDINE HCL 25 MG/ML IJ SOLN: 6.2500 mg | INTRAMUSCULAR | Status: DC | PRN

## 2017-02-20 SURGICAL SUPPLY — 14 items

## 2017-02-20 NOTE — Op Note (Signed)
[facility] Patient Name: Samantha Richards Procedure Date: 02/20/2017 MRN: [id number] Attending MD: Missy Sabins , MD Date of Birth: [birth year] CSN: [id number] Age: 56 Admit Type: Inpatient Procedure:                Upper GI endoscopy Indications:              Unexplained iron deficiency anemia Providers:                Elyse Jarvis. Amedeo Plenty, MD, Laverta Baltimore RN, RN, Tinnie Gens, Technician Referring MD:              Medicines:                Propofol per Anesthesia Complications:            No immediate complications. Estimated Blood Loss:     Estimated blood loss: none. Procedure:                Pre-Anesthesia Assessment:                           - Prior to the procedure, a History and Physical                            was performed, and patient medications and                            allergies were reviewed. The patient's tolerance of                            previous anesthesia was also reviewed. The risks                            and benefits of the procedure and the sedation                            options and risks were discussed with the patient.                            All questions were answered, and informed consent                            was obtained. Prior Anticoagulants: The patient has                            taken no previous anticoagulant or antiplatelet                            agents. ASA Grade Assessment: III - A patient with                            severe systemic disease. After reviewing the risks  and benefits, the patient was deemed in                            satisfactory condition to undergo the procedure.                           After obtaining informed consent, the endoscope was                            passed under direct vision. Throughout the                            procedure, the patient's blood pressure, pulse, and                            oxygen  saturations were monitored continuously. The                            EG-[id number] ([id number]) scope was introduced through the                            mouth, and advanced to the second part of duodenum.                            The upper GI endoscopy was accomplished without                            difficulty. The patient tolerated the procedure                            well. Scope In: Scope Out: Findings:      LA Grade D (one or more mucosal breaks involving at least 75% of       esophageal circumference) esophagitis with no bleeding was found 36 to       39 cm from the incisors.      Scattered moderate inflammation characterized by erythema and       granularity was found in the entire examined stomach. Biopsies were       taken with a cold forceps for histology.      A 2 cm hiatal hernia was present. Impression:               - LA Grade D reflux esophagitis.                           - Gastritis. Biopsied.                           - 2 cm hiatal hernia. Moderate Sedation:      no moderate sedation Recommendation:           - Await pathology results.                           - Use Protonix (pantoprazole) 40 mg PO BID.                           -  Resume previous diet.                           - Continue present medications. Procedure Code(s):        --- Professional ---                           [phone], Esophagogastroduodenoscopy, flexible,                            transoral; with biopsy, single or multiple Diagnosis Code(s):        --- Professional ---                           K21.0, Gastro-esophageal reflux disease with                            esophagitis                           K29.70, Gastritis, unspecified, without bleeding                           K44.9, Diaphragmatic hernia without obstruction or                            gangrene                           D50.9, Iron deficiency anemia, unspecified CPT copyright 2016 [employer]. All  rights reserved. The codes documented in this report are preliminary and upon coder review may  be revised to meet current compliance requirements. Missy Sabins, MD 02/20/2017 1:25:14 PM This report has been signed electronically. Number of Addenda: 0

## 2017-02-20 NOTE — Addendum Note (Signed)
Addendum  created 02/20/17 1357 by Janeece Riggers, MD   Sign clinical note

## 2017-02-20 NOTE — Transfer of Care (Signed)
Immediate Anesthesia Transfer of Care Note  Patient: Samantha Richards  Procedure(s) Performed: Procedure(s): ESOPHAGOGASTRODUODENOSCOPY (EGD) WITH PROPOFOL (N/A)  Patient Location: PACU and Endoscopy Unit  Anesthesia Type:MAC  Level of Consciousness: awake, alert , oriented and patient cooperative  Airway & Oxygen Therapy: Patient Spontanous Breathing and Patient connected to nasal cannula oxygen  Post-op Assessment: Report given to RN, Post -op Vital signs reviewed and stable and Patient moving all extremities  Post vital signs: Reviewed and stable  Last Vitals:  Vitals:   02/20/17 0900 02/20/17 1152  BP: (!) 79/45 106/62  Pulse: 95 97  Resp: 18 (!) 24  Temp: 36.8 C 36.4 C    Last Pain:  Vitals:   02/20/17 1152  TempSrc: Oral  PainSc:          Complications: No apparent anesthesia complications

## 2017-02-20 NOTE — Anesthesia Postprocedure Evaluation (Signed)
Anesthesia Post Note  Patient: Aracelli Woloszyn  Procedure(s) Performed: Procedure(s) (LRB): ESOPHAGOGASTRODUODENOSCOPY (EGD) WITH PROPOFOL (N/A)     Patient location during evaluation: PACU Anesthesia Type: MAC Level of consciousness: awake and alert Pain management: pain level controlled Vital Signs Assessment: post-procedure vital signs reviewed and stable Respiratory status: spontaneous breathing, nonlabored ventilation, respiratory function stable and patient connected to nasal cannula oxygen Cardiovascular status: stable and blood pressure returned to baseline Anesthetic complications: no    Last Vitals:  Vitals:   02/20/17 1340 02/20/17 1347  BP: 99/61 100/61  Pulse: 97 (!) 101  Resp: (!) 23 (!) 24  Temp:      Last Pain:  Vitals:   02/20/17 1334  TempSrc: Oral  PainSc:                  Atthew Coutant

## 2017-02-20 NOTE — Progress Notes (Signed)
PT Cancellation Note  Patient Details Name: Samantha Richards MRN: [id number] DOB: 09/09/1960   Cancelled Treatment:    Reason Eval/Treat Not Completed: Patient at procedure or test/unavailable Endoscopy   Cartier Washko,KATHrine E 02/20/2017, 1:04 PM Carmelia Bake, PT, DPT 02/20/2017 Pager: [phone]

## 2017-02-20 NOTE — Progress Notes (Signed)
Progress Note  Patient Name: Samantha Richards Date of Encounter: 02/20/2017  Primary Cardiologist: None  Subjective   Had a paracentesis done yesterday with 2.5 liters of fluid removed.-- noticeable improvement of anasarca.   BP 92/66 On  Amio drip, levophed is now off   Inpatient Medications    Scheduled Meds: . [MAR Hold] Chlorhexidine Gluconate Cloth  6 each Topical Daily  . [MAR Hold] furosemide  40 mg Intravenous Once  . [MAR Hold] insulin aspart  0-15 Units Subcutaneous Q4H  . [MAR Hold] levothyroxine  100 mcg Oral QAC breakfast  . [MAR Hold] mouth rinse  15 mL Mouth Rinse BID  . [MAR Hold] multivitamin with minerals  1 tablet Oral Daily  . pantoprazole  40 mg Oral BID  . [MAR Hold] sodium chloride flush  10-40 mL Intracatheter Q12H  . [MAR Hold] thiamine  100 mg Intravenous Daily   Continuous Infusions: . [MAR Hold] sodium chloride     Followed by  . [MAR Hold] sodium chloride Stopped (02/16/17 1518)  . [MAR Hold] sodium chloride    . [MAR Hold] sodium chloride    . amiodarone 30 mg/hr (02/20/17 0628)  . [MAR Hold] cefTRIAXone (ROCEPHIN)  IV Stopped (02/20/17 0912)  . [MAR Hold] DOBUTamine Stopped (02/18/17 1020)  . [MAR Hold] norepinephrine (LEVOPHED) Adult infusion 1 mcg/min (02/20/17 1323)   PRN Meds: [MAR Hold] sodium chloride, [MAR Hold] guaiFENesin-dextromethorphan, [MAR Hold] iopamidol, [MAR Hold] lip balm, [MAR Hold] ondansetron (ZOFRAN) IV, [MAR Hold] promethazine, [MAR Hold] sodium chloride flush   Vital Signs    Vitals:   02/20/17 1152 02/20/17 1334 02/20/17 1340 02/20/17 1347  BP: 106/62 99/62 99/61  100/61  Pulse: 97 (!) 101 97 (!) 101  Resp: (!) 24 (!) 28 (!) 23 (!) 24  Temp: 97.5 F (36.4 C) 97.6 F (36.4 C)    TempSrc: Oral Oral    SpO2: 98% 100% 98% 100%  Weight:      Height:        Intake/Output Summary (Last 24 hours) at 02/20/17 1417 Last data filed at 02/20/17 0600  Gross per 24 hour  Intake           805.16 ml  Output               550 ml  Net           255.16 ml   Filed Weights   02/18/17 0340 02/19/17 0421 02/20/17 0500  Weight: 250 lb 3.6 oz (113.5 kg) 246 lb 11.1 oz (111.9 kg) 251 lb 5.2 oz (114 kg)    Telemetry    Atrial flutter, HR 70-110s - Personally Reviewed   Physical Exam   GEN: Well nourished, well developed, + obese HEENT: normal  Neck: no JVD, carotid bruits, or masses Cardiac: irreg irreg. no murmurs, rubs, or gallops.. Intact distal pulses bilaterally. +le swelling Respiratory: clear to auscultation bilaterally, normal work of breathing GI: soft, nontender, nondistended, + BS MS: no deformity or atrophy  Skin: warm and dry, + lower extremity skin wounds Neuro: Alert and Oriented x 3, Strength and sensation are intact Psych:   Full affect  Labs    Chemistry Recent Labs Lab 02/16/17 1421  02/18/17 0432 02/18/17 1415 02/19/17 0424 02/20/17 0517  NA 140  < > 141  --  140 142  K 4.2  < > 4.0  --  3.8 3.6  CL 112*  < > 114*  --  113* 113*  CO2 17*  < > 18*  --  18* 18*  GLUCOSE 119*  < > 148*  --  140* 141*  BUN 52*  < > 65*  --  62* 59*  CREATININE 1.96*  < > 1.99*  --  1.97* 1.89*  CALCIUM 9.1  < > 8.6*  --  8.6* 8.7*  PROT 6.2*  --   --  6.0*  --   --   ALBUMIN 2.8*  --   --  2.7*  --   --   AST 30  --   --  19  --   --   ALT 22  --   --  18  --   --   ALKPHOS 105  --   --  95  --   --   BILITOT 2.9*  --   --  2.3*  --   --   GFRNONAA 28*  < > 27*  --  28* 29*  GFRAA 32*  < > 32*  --  32* 34*  ANIONGAP 11  < > 9  --  9 11  < > = values in this interval not displayed.   Hematology  Recent Labs Lab 02/18/17 2011 02/19/17 0424 02/20/17 0517  WBC 24.4* 21.5* 19.3*  RBC 3.68* 3.64* 3.66*  HGB 8.5* 8.4* 8.3*  HCT 27.3* 26.9* 27.1*  MCV 74.2* 73.9* 74.0*  MCH 23.1* 23.1* 22.7*  MCHC 31.1 31.2 30.6  RDW 28.5* 28.8* 29.3*  PLT 208 206 189    Cardiac Enzymes  Recent Labs Lab 02/17/17 0327 02/17/17 1103 02/17/17 1529 02/17/17 2136  TROPONINI <0.03 <0.03 <0.03  <0.03   No results for input(s): TROPIPOC in the last 168 hours.   BNP  Recent Labs Lab 02/16/17 1516  BNP 923.6*     DDimer No results for input(s): DDIMER in the last 168 hours.   Radiology    No results found.  Cardiac Studies   Transthoracic Echo (02/17/2017) - Left ventricle: Septal flattening consistant with elevated RV   pressures. Systolic function was moderately to severely reduced.   The estimated ejection fraction was in the range of 30% to 35%.   Diffuse hypokinesis. - Mitral valve: There was mild regurgitation. - Left atrium: The atrium was mildly dilated. - Right ventricle: The cavity size was moderately dilated. - Right atrium: The atrium was moderately dilated. - Atrial septum: No defect or patent foramen ovale was identified. - Tricuspid valve: There was moderate-severe regurgitation. - Pericardium, extracardiac: A trivial pericardial effusion was   identified. - Impressions: Suspect PA pressure underestimated by TR velocity   due to RV dysfunction. There is moderate RV enlargement with   severe hypokinesis and signs of significant cor pulmonale.  Impressions:  - Suspect PA pressure underestimated by TR velocity due to RV   dysfunction. There is moderate RV enlargement with severe   hypokinesis and signs of significant cor pulmonale.   Patient Profile   Samantha Richards has not sought medical care in years and does not have a known past medical history aside from obesity. She denies ETOH, drug use and is a never smoker. She is being seen for the evaluation of atrial fibrillation with RVR and RV overload/cor pulmonale at the request of Dr. Lake Bells, Critical Care .  Assessment & Plan     1. Acute systolic CHF: EF 35% by echo, total body anasarca R > L Seems to be inproving . HR is better. Continue supportive care. 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 Will add CHF meds as tolerated.  2. Cor pulmonale: pt intolerant of CPAP, will need  outpatient sleep study. Continue to diurese.  3. Anemia: s/p transfusion:  EGD today shows gastritis and esophagitis.  Starting protonix   4. Atrial flutter: w/ RVR, on amiodarone for rate control. Rates 70-115. She is still on Levophed drip. BPs improved but still soft.  Repeat ECG    Mertie Moores, MD  02/20/2017 2:21 PM    [address],  [address] [place], [place]  [id number] Pager [phone] Phone: [phone]; Fax: [phone]

## 2017-02-20 NOTE — Progress Notes (Signed)
GI: EGD done:  intense distal esophagitis with focal hemorrhagic appearance no definite esophageal ulcer with small hiatal hernia. Hypertrophied gastric folds with intense streaks of erythema down to the antrum no definite ulcer no blood in the stomach. Biopsies taken. Duodenum normal. Do not know that these findings explains her anemia but she does not want colonoscopy tomorrow and this is to be deferred to a later date Would treat with intensive PPI therapy and await Helicobacter testing. Given the one heme-negative stool, and absence of any overt melena or significant bright red blood per rectum, might want to consider hematology consult as well.

## 2017-02-20 NOTE — Interval H&P Note (Signed)
History and Physical Interval Note:  02/20/2017 12:30 PM  Samantha Richards  has presented today for surgery, with the diagnosis of Hematemesis  The various methods of treatment have been discussed with the patient and family. After consideration of risks, benefits and other options for treatment, the patient has consented to  Procedure(s): ESOPHAGOGASTRODUODENOSCOPY (EGD) WITH PROPOFOL (N/A) as a surgical intervention .  The patient's history has been reviewed, patient examined, no change in status, stable for surgery.  I have reviewed the patient's chart and labs.  Questions were answered to the patient's satisfaction.     Zera Markwardt C

## 2017-02-20 NOTE — Progress Notes (Signed)
NUTRITION NOTE  Discussed pt with Dr. Lake Bells this AM. Pt has been in ENDO since late morning. GI note from 1317 states: intense distal esophagitis with focal hemorrhagic appearance no definite esophageal ulcer with small hiatal hernia.   Pt has been NPO today for EGD and was on Heart Healthy diet, 2L fluid restriction starting at 0925 yesterday with no documented intakes. Will talk with pt tomorrow about PO intakes and abilities and also oral nutrition supplements and/or snacks per pt preference at that time.      Jarome Matin, MS, RD, LDN, [facility] Inpatient Clinical Dietitian Pager # [phone] After hours/weekend pager # [phone]

## 2017-02-20 NOTE — Anesthesia Preprocedure Evaluation (Addendum)
Anesthesia Evaluation  Patient identified by MRN, date of birth, ID band Patient awake    Reviewed: Allergy & Precautions, NPO status , Patient's Chart, lab work & pertinent test results  Airway Mallampati: I  TM Distance: >3 FB Neck ROM: Full    Dental no notable dental hx.    Pulmonary neg pulmonary ROS,    Pulmonary exam normal breath sounds clear to auscultation       Cardiovascular Exercise Tolerance: Poor +CHF  Normal cardiovascular exam+ dysrhythmias Atrial Fibrillation  Rhythm:Regular Rate:Normal     Neuro/Psych negative neurological ROS  negative psych ROS   GI/Hepatic negative GI ROS, Neg liver ROS,   Endo/Other  negative endocrine ROS  Renal/GU negative Renal ROS  negative genitourinary   Musculoskeletal negative musculoskeletal ROS (+)   Abdominal   Peds negative pediatric ROS (+)  Hematology negative hematology ROS (+)   Anesthesia Other Findings  Echo - Left ventricle: Septal flattening consistant with elevated RV   pressures. Systolic function was moderately to severely reduced.   The estimated ejection fraction was in the range of 30% to 35%.   Diffuse hypokinesis. - Mitral valve: There was mild regurgitation. - Left atrium: The atrium was mildly dilated. - Right ventricle: The cavity size was moderately dilated. - Right atrium: The atrium was moderately dilated. - Atrial septum: No defect or patent foramen ovale was identified. - Tricuspid valve: There was moderate-severe regurgitation. - Pericardium, extracardiac: A trivial pericardial effusion was   identified. - Impressions: Suspect PA pressure underestimated by TR velocity   due to RV dysfunction. There is moderate RV enlargement with   severe hypokinesis and signs of significant cor pulmonale.    CHEST 1 VIEW COMPARISON:  None. FINDINGS: Cardiomegaly, moderate two severe. Upper mediastinal contours, including aortic arch, difficult  to characterize due to patient positioning. Central pulmonary vascular congestion and probable mild perihilar edema. No pneumothorax seen. No acute or suspicious osseous finding. IMPRESSION: 1. Cardiomegaly, moderate to severe degree. 2. Central pulmonary vascular congestion and probable mild perihilar interstitial edema suggesting mild CHF/volume overload.   Reproductive/Obstetrics negative OB ROS                          Anesthesia Physical Anesthesia Plan  ASA: IV  Anesthesia Plan: MAC   Post-op Pain Management:    Induction: Intravenous  PONV Risk Score and Plan: 2 and Ondansetron and Dexamethasone  Airway Management Planned: Mask and Natural Airway  Additional Equipment:   Intra-op Plan:   Post-operative Plan:   Informed Consent: I have reviewed the patients History and Physical, chart, labs and discussed the procedure including the risks, benefits and alternatives for the proposed anesthesia with the patient or authorized representative who has indicated his/her understanding and acceptance.   Dental advisory given  Plan Discussed with:   Anesthesia Plan Comments:       Anesthesia Quick Evaluation

## 2017-02-20 NOTE — Progress Notes (Signed)
PULMONARY / CRITICAL CARE MEDICINE   Name: Samantha Richards MRN: [id number] DOB: 09-Dec-1960    ADMISSION DATE:  02/16/2017 CONSULTATION DATE:  02/16/2017  REFERRING MD:  Nils Flack, PA   CHIEF COMPLAINT:  Anemia   BRIEF 56 y/o female with obesity admitted on 6/25 with anemia after a fall.  Noted some bloody stool for 2-3 weeks, but says it was only a few clots per day.  Noted to have a Hgb of 3 on admission.   SUBJECTIVE:  Ready to eat more Became hypotensive again overnight, back on levophed   VITAL SIGNS: BP (!) 79/45   Pulse 95   Temp 98.2 F (36.8 C)   Resp 18   Ht 5\' 5"  (1.651 m)   Wt 251 lb 5.2 oz (114 kg)   LMP 01/16/2017 Comment: neg preg 02-16-2017  SpO2 98%   BMI 41.82 kg/m   HEMODYNAMICS: CVP:  [7 mmHg-30 mmHg] 30 mmHg  VENTILATOR SETTINGS:    INTAKE / OUTPUT: I/O last 3 completed shifts: In: 2644.3 [P.O.:240; I.V.:1854.3; IV Piggyback:550] Out: 7824 [Urine:1470]  PHYSICAL EXAMINATION:  General:  Resting comfortably in bed HENT: NCAT OP clear PULM: CTA B, normal effort CV: RRR, no mgr GI: BS+, soft, nontender MSK: normal bulk and tone Neuro: awake, alert, no distress, MAEW Derm: edema hips/legs   LABS:  BMET  Recent Labs Lab 02/18/17 0432 02/19/17 0424 02/20/17 0517  NA 141 140 142  K 4.0 3.8 3.6  CL 114* 113* 113*  CO2 18* 18* 18*  BUN 65* 62* 59*  CREATININE 1.99* 1.97* 1.89*  GLUCOSE 148* 140* 141*    Electrolytes  Recent Labs Lab 02/17/17 0327  02/18/17 0432 02/19/17 0424 02/20/17 0517  CALCIUM 9.0  < > 8.6* 8.6* 8.7*  MG 2.1  --   --  2.3  --   PHOS 4.4  --   --   --   --   < > = values in this interval not displayed.  CBC  Recent Labs Lab 02/18/17 2011 02/19/17 0424 02/20/17 0517  WBC 24.4* 21.5* 19.3*  HGB 8.5* 8.4* 8.3*  HCT 27.3* 26.9* 27.1*  PLT 208 206 189    Coag's  Recent Labs Lab 02/16/17 1421 02/18/17 1415  APTT 35  --   INR 1.40 1.39    Sepsis Markers  Recent Labs Lab 02/16/17 1439  02/17/17 0327  LATICACIDVEN 2.46* 1.4    ABG No results for input(s): PHART, PCO2ART, PO2ART in the last 168 hours.  Liver Enzymes  Recent Labs Lab 02/16/17 1421 02/18/17 1415  AST 30 19  ALT 22 18  ALKPHOS 105 95  BILITOT 2.9* 2.3*  ALBUMIN 2.8* 2.7*    Cardiac Enzymes  Recent Labs Lab 02/17/17 1103 02/17/17 1529 02/17/17 2136  TROPONINI <0.03 <0.03 <0.03    Glucose  Recent Labs Lab 02/19/17 1241 02/19/17 1604 02/19/17 1925 02/19/17 2322 02/20/17 0339 02/20/17 0759  GLUCAP 149* 151* 151* 150* 142* 124*    Imaging No results found.   STUDIES:  CXR 6/24 > Cardiomegaly, moderate to severe degree, central vascular pulmonary congestion and probable mild perihilar interstitial edema suggesting mild CHF/volume overload  XR right Knee 6/24 > No Acute ECHO 6/24 >>  LVEF 30-35%, RV mod dilated, severe hypokinesis, LA dilated, mild MR, RA mod dilated CT abdomen/pelvis 6/26> notable ascites, hepatic steatosis, large uterine fibroids vs peritoneal mets Paracentesis 6/26 > 2.5 L fluid removed, cytology negative  CULTURES: Blood 6/24 >> coag neg staph 2/4 Peritoneal fluid 6/26 > neg  ANTIBIOTICS: Vancomycin 6/24 > 6/28 Rocephin 6/24 >>  SIGNIFICANT EVENTS: 6/24 > Presents to ED   LINES/TUBES: PIV   DISCUSSION: 56 y/o female admitted with critical anemia in the setting of non-brisk GI bleeding, found to have shock, presumed AKI and systolic heart failure with RV overload.  Has required vasopressor support for presumed mixed hypovolemic (hemorrhagic) and cardiogenic shock.  Noted on CT abdomen to have large uterine fibroids and ascites.  ASSESSMENT / PLAN:  PULMONARY A: Cough, chest congestion Probable sleep apnea, intolerant of CPAP in hospital P:   Continue robitussin DM Monitor O2 saturation Will need outpatient sleep study Hold of on using CPAP for now  CARDIOVASCULAR A:  Cardiogenic shock > hypotensive again on 6/28 RV overload/cor  pulmonale Acute systolic heart failure, ddx ischemic vs non-ischemic (nutritional related?) Afib with RVR > better  P:  Tele Continue CVP monitoring Check coox again now Check lactic acid again now Use levophed for MAP > 55 Right heart cath after Hgb stabilized, consider left heart cath as well For cardiology: do we need to consider left and right heart cath ? Continue amiodarone for now Diet: Fluid and sodium restriction  RENAL A:   Acute Kidney Injury, presumed (unclear baseline) > improving P:   KVO fluids Monitor BMET and UOP Replace electrolytes as needed   GASTROINTESTINAL A:   Severe protein-calorie malnutrition: Kwashiorkor; prealbumin < 5, history supports poor po intake for months Slow GI bleeding Some upper gi bleeding with vomiting Hepatic steatosis Ascites > cytology negative Peritoneal mets vs uterine fibroids P:   Continue PPI drip Endoscopy today Nutrition consult, advance diet today post endoscopy, supplements? Continue thiamine, folic acid  HEMATOLOGIC A:   Anemia, bleeding appears to be out of proportion to blood per stool Little evidence of hemoylsis: Coombs neg, haptoglobin wnl P:  Maintain Hgb > 7gm/dL  INFECTIOUS A:   Skin breakdown/wounds present on admission Cellulitis Probable blood culture contamination (staph epi 2/4 cultures) P:   Stop vanc Continue ceftriaxone Monitor blood cultures Wound care  ENDOCRINE A:   Mild hyperglycemia Hypothyroid > TSH 11.7 P:   Trend Glucose  Start synthroid SSI> target 140-180  NEUROLOGIC A:   Muscular deconditioning P:   PT consult today   FAMILY  - Updates: no family present  - Inter-disciplinary family meet or Palliative Care meeting due by:  02/23/2017.  My cc time 33 minutes   Roselie Awkward, MD [facility] Pager: [phone] Cell: [phone] After 3pm or if no response, call [phone]   02/20/2017, 10:42 AM

## 2017-02-20 NOTE — Anesthesia Postprocedure Evaluation (Signed)
Anesthesia Post Note  Patient: Samantha Richards  Procedure(s) Performed: Procedure(s) (LRB): ESOPHAGOGASTRODUODENOSCOPY (EGD) WITH PROPOFOL (N/A)     Patient location during evaluation: PACU Anesthesia Type: MAC Level of consciousness: awake and alert Pain management: pain level controlled Vital Signs Assessment: post-procedure vital signs reviewed and stable Respiratory status: spontaneous breathing, nonlabored ventilation, respiratory function stable and patient connected to nasal cannula oxygen Cardiovascular status: stable and blood pressure returned to baseline Anesthetic complications: no    Last Vitals:  Vitals:   02/20/17 1340 02/20/17 1347  BP: 99/61 100/61  Pulse: 97 (!) 101  Resp: (!) 23 (!) 24  Temp:      Last Pain:  Vitals:   02/20/17 1334  TempSrc: Oral  PainSc:                  Prospero Mahnke     

## 2017-02-20 NOTE — H&P (View-Only) (Signed)
PULMONARY / CRITICAL CARE MEDICINE   Name: Samantha Richards MRN: [id number] DOB: 21-Feb-1961    ADMISSION DATE:  02/16/2017 CONSULTATION DATE:  02/16/2017  REFERRING MD:  Nils Flack, PA   CHIEF COMPLAINT:  Anemia   BRIEF 56 y/o female with obesity admitted on 6/25 with anemia after a fall.  Noted some bloody stool for 2-3 weeks, but says it was only a few clots per day.  Noted to have a Hgb of 3 on admission.   SUBJECTIVE:  Ready to eat more Became hypotensive again overnight, back on levophed   VITAL SIGNS: BP (!) 79/45   Pulse 95   Temp 98.2 F (36.8 C)   Resp 18   Ht 5\' 5"  (1.651 m)   Wt 251 lb 5.2 oz (114 kg)   LMP 01/16/2017 Comment: neg preg 02-16-2017  SpO2 98%   BMI 41.82 kg/m   HEMODYNAMICS: CVP:  [7 mmHg-30 mmHg] 30 mmHg  VENTILATOR SETTINGS:    INTAKE / OUTPUT: I/O last 3 completed shifts: In: 2644.3 [P.O.:240; I.V.:1854.3; IV Piggyback:550] Out: 9242 [Urine:1470]  PHYSICAL EXAMINATION:  General:  Resting comfortably in bed HENT: NCAT OP clear PULM: CTA B, normal effort CV: RRR, no mgr GI: BS+, soft, nontender MSK: normal bulk and tone Neuro: awake, alert, no distress, MAEW Derm: edema hips/legs   LABS:  BMET  Recent Labs Lab 02/18/17 0432 02/19/17 0424 02/20/17 0517  NA 141 140 142  K 4.0 3.8 3.6  CL 114* 113* 113*  CO2 18* 18* 18*  BUN 65* 62* 59*  CREATININE 1.99* 1.97* 1.89*  GLUCOSE 148* 140* 141*    Electrolytes  Recent Labs Lab 02/17/17 0327  02/18/17 0432 02/19/17 0424 02/20/17 0517  CALCIUM 9.0  < > 8.6* 8.6* 8.7*  MG 2.1  --   --  2.3  --   PHOS 4.4  --   --   --   --   < > = values in this interval not displayed.  CBC  Recent Labs Lab 02/18/17 2011 02/19/17 0424 02/20/17 0517  WBC 24.4* 21.5* 19.3*  HGB 8.5* 8.4* 8.3*  HCT 27.3* 26.9* 27.1*  PLT 208 206 189    Coag's  Recent Labs Lab 02/16/17 1421 02/18/17 1415  APTT 35  --   INR 1.40 1.39    Sepsis Markers  Recent Labs Lab 02/16/17 1439  02/17/17 0327  LATICACIDVEN 2.46* 1.4    ABG No results for input(s): PHART, PCO2ART, PO2ART in the last 168 hours.  Liver Enzymes  Recent Labs Lab 02/16/17 1421 02/18/17 1415  AST 30 19  ALT 22 18  ALKPHOS 105 95  BILITOT 2.9* 2.3*  ALBUMIN 2.8* 2.7*    Cardiac Enzymes  Recent Labs Lab 02/17/17 1103 02/17/17 1529 02/17/17 2136  TROPONINI <0.03 <0.03 <0.03    Glucose  Recent Labs Lab 02/19/17 1241 02/19/17 1604 02/19/17 1925 02/19/17 2322 02/20/17 0339 02/20/17 0759  GLUCAP 149* 151* 151* 150* 142* 124*    Imaging No results found.   STUDIES:  CXR 6/24 > Cardiomegaly, moderate to severe degree, central vascular pulmonary congestion and probable mild perihilar interstitial edema suggesting mild CHF/volume overload  XR right Knee 6/24 > No Acute ECHO 6/24 >>  LVEF 30-35%, RV mod dilated, severe hypokinesis, LA dilated, mild MR, RA mod dilated CT abdomen/pelvis 6/26> notable ascites, hepatic steatosis, large uterine fibroids vs peritoneal mets Paracentesis 6/26 > 2.5 L fluid removed, cytology negative  CULTURES: Blood 6/24 >> coag neg staph 2/4 Peritoneal fluid 6/26 > neg  ANTIBIOTICS: Vancomycin 6/24 > 6/28 Rocephin 6/24 >>  SIGNIFICANT EVENTS: 6/24 > Presents to ED   LINES/TUBES: PIV   DISCUSSION: 56 y/o female admitted with critical anemia in the setting of non-brisk GI bleeding, found to have shock, presumed AKI and systolic heart failure with RV overload.  Has required vasopressor support for presumed mixed hypovolemic (hemorrhagic) and cardiogenic shock.  Noted on CT abdomen to have large uterine fibroids and ascites.  ASSESSMENT / PLAN:  PULMONARY A: Cough, chest congestion Probable sleep apnea, intolerant of CPAP in hospital P:   Continue robitussin DM Monitor O2 saturation Will need outpatient sleep study Hold of on using CPAP for now  CARDIOVASCULAR A:  Cardiogenic shock > hypotensive again on 6/28 RV overload/cor  pulmonale Acute systolic heart failure, ddx ischemic vs non-ischemic (nutritional related?) Afib with RVR > better  P:  Tele Continue CVP monitoring Check coox again now Check lactic acid again now Use levophed for MAP > 55 Right heart cath after Hgb stabilized, consider left heart cath as well For cardiology: do we need to consider left and right heart cath ? Continue amiodarone for now Diet: Fluid and sodium restriction  RENAL A:   Acute Kidney Injury, presumed (unclear baseline) > improving P:   KVO fluids Monitor BMET and UOP Replace electrolytes as needed   GASTROINTESTINAL A:   Severe protein-calorie malnutrition: Kwashiorkor; prealbumin < 5, history supports poor po intake for months Slow GI bleeding Some upper gi bleeding with vomiting Hepatic steatosis Ascites > cytology negative Peritoneal mets vs uterine fibroids P:   Continue PPI drip Endoscopy today Nutrition consult, advance diet today post endoscopy, supplements? Continue thiamine, folic acid  HEMATOLOGIC A:   Anemia, bleeding appears to be out of proportion to blood per stool Little evidence of hemoylsis: Coombs neg, haptoglobin wnl P:  Maintain Hgb > 7gm/dL  INFECTIOUS A:   Skin breakdown/wounds present on admission Cellulitis Probable blood culture contamination (staph epi 2/4 cultures) P:   Stop vanc Continue ceftriaxone Monitor blood cultures Wound care  ENDOCRINE A:   Mild hyperglycemia Hypothyroid > TSH 11.7 P:   Trend Glucose  Start synthroid SSI> target 140-180  NEUROLOGIC A:   Muscular deconditioning P:   PT consult today   FAMILY  - Updates: no family present  - Inter-disciplinary family meet or Palliative Care meeting due by:  02/23/2017.  My cc time 33 minutes   Roselie Awkward, MD [facility] Pager: [phone] Cell: [phone] After 3pm or if no response, call [phone]   02/20/2017, 10:42 AM

## 2017-02-21 ENCOUNTER — Inpatient Hospital Stay (HOSPITAL_COMMUNITY): Payer: Self-pay

## 2017-02-21 ENCOUNTER — Encounter (HOSPITAL_COMMUNITY): Payer: Self-pay | Admitting: Gastroenterology

## 2017-02-21 DIAGNOSIS — E43 Unspecified severe protein-calorie malnutrition: Secondary | ICD-10-CM | POA: Diagnosis present

## 2017-02-21 DIAGNOSIS — R570 Cardiogenic shock: Secondary | ICD-10-CM | POA: Diagnosis present

## 2017-02-21 DIAGNOSIS — E039 Hypothyroidism, unspecified: Secondary | ICD-10-CM | POA: Diagnosis present

## 2017-02-21 DIAGNOSIS — Z452 Encounter for adjustment and management of vascular access device: Secondary | ICD-10-CM

## 2017-02-21 LAB — GLUCOSE, CAPILLARY
GLUCOSE-CAPILLARY: 160 mg/dL — AB (ref 65–99)
GLUCOSE-CAPILLARY: 128 mg/dL — AB (ref 65–99)
Glucose-Capillary: 145 mg/dL — ABNORMAL HIGH (ref 65–99)
Glucose-Capillary: 106 mg/dL — ABNORMAL HIGH (ref 65–99)
Glucose-Capillary: 137 mg/dL — ABNORMAL HIGH (ref 65–99)

## 2017-02-21 LAB — CBC
HEMATOCRIT: 27.3 % — AB (ref 36.0–46.0)
Hemoglobin: 8.3 g/dL — ABNORMAL LOW (ref 12.0–15.0)
MCH: 22.6 pg — ABNORMAL LOW (ref 26.0–34.0)
MCHC: 30.4 g/dL (ref 30.0–36.0)
MCV: 74.4 fL — AB (ref 78.0–100.0)
PLATELETS: 214 10*3/uL (ref 150–400)
RBC: 3.67 MIL/uL — ABNORMAL LOW (ref 3.87–5.11)
RDW: 29.8 % — AB (ref 11.5–15.5)
WBC: 20.6 10*3/uL — AB (ref 4.0–10.5)

## 2017-02-21 LAB — CULTURE, BLOOD (ROUTINE X 2): Culture: NO GROWTH

## 2017-02-21 LAB — BASIC METABOLIC PANEL
Anion gap: 9 (ref 5–15)
BUN: 60 mg/dL — AB (ref 6–20)
CHLORIDE: 115 mmol/L — AB (ref 101–111)
CO2: 18 mmol/L — ABNORMAL LOW (ref 22–32)
CREATININE: 1.86 mg/dL — AB (ref 0.44–1.00)
Calcium: 8.5 mg/dL — ABNORMAL LOW (ref 8.9–10.3)
GFR calc Af Amer: 35 mL/min — ABNORMAL LOW (ref >60)
GFR, EST NON AFRICAN AMERICAN: 30 mL/min — AB (ref >60)
GLUCOSE: 140 mg/dL — AB (ref 65–99)
POTASSIUM: 3.4 mmol/L — AB (ref 3.5–5.1)
SODIUM: 142 mmol/L (ref 135–145)

## 2017-02-21 MED ORDER — POTASSIUM CHLORIDE CRYS ER 20 MEQ PO TBCR
40.0000 meq | EXTENDED_RELEASE_TABLET | Freq: Once | ORAL | Status: AC
  Administered 2017-02-21: 40 meq via ORAL
  Filled 2017-02-21: qty 2

## 2017-02-21 MED ORDER — ENSURE ENLIVE PO LIQD
237.0000 mL | Freq: Three times a day (TID) | ORAL | Status: DC
  Administered 2017-02-21 – 2017-02-27 (×9): 237 mL via ORAL

## 2017-02-21 NOTE — Progress Notes (Signed)
Pt refused to get peripheral IV, does not want to be stuck at the moment. Would rather have it done during the day.

## 2017-02-21 NOTE — Progress Notes (Signed)
PULMONARY / CRITICAL CARE MEDICINE   Name: Samantha Richards MRN: [id number] DOB: Aug 04, 1961    ADMISSION DATE:  02/16/2017 CONSULTATION DATE:  02/16/2017  REFERRING MD:  Nils Flack, PA   CHIEF COMPLAINT:  Anemia   BRIEF 56 y/o female with obesity admitted on 6/25 with anemia after a fall.  Noted some bloody stool for 2-3 weeks, but says it was only a few clots per day.  Noted to have a Hgb of 3 on admission.   SUBJECTIVE:  Pt reports feeling much better.  Off vasopressors.     VITAL SIGNS: BP 97/68   Pulse (!) 105   Temp 98.2 F (36.8 C)   Resp (!) 23   Ht 5\' 5"  (1.651 m)   Wt 250 lb 14.1 oz (113.8 kg)   LMP 01/16/2017 Comment: neg preg 02-16-2017  SpO2 97%   BMI 41.75 kg/m   HEMODYNAMICS:    VENTILATOR SETTINGS:    INTAKE / OUTPUT: I/O last 3 completed shifts: In: 1313 [I.V.:1263; IV Piggyback:50] Out: 1390 [WYOVZ:8588]  PHYSICAL EXAMINATION: General: chronically ill appearing female in NAD   HEENT: MM pink/moist, JVD+ PSY: calm/appropriate  Neuro: AAOx4, speech clear, MAE CV: s1s2 rrr, no m/r/g PULM: even/non-labored, lungs bilaterally clear anterior, diminished lower  FO:YDXA, non-tender, bsx4 active  Extremities: warm/dry, generalized 2+ pitting edema  Skin: no rashes or lesions  LABS:  BMET  Recent Labs Lab 02/19/17 0424 02/20/17 0517 02/21/17 0310  NA 140 142 142  K 3.8 3.6 3.4*  CL 113* 113* 115*  CO2 18* 18* 18*  BUN 62* 59* 60*  CREATININE 1.97* 1.89* 1.86*  GLUCOSE 140* 141* 140*    Electrolytes  Recent Labs Lab 02/17/17 0327  02/19/17 0424 02/20/17 0517 02/21/17 0310  CALCIUM 9.0  < > 8.6* 8.7* 8.5*  MG 2.1  --  2.3  --   --   PHOS 4.4  --   --   --   --   < > = values in this interval not displayed.  CBC  Recent Labs Lab 02/19/17 0424 02/20/17 0517 02/21/17 0310  WBC 21.5* 19.3* 20.6*  HGB 8.4* 8.3* 8.3*  HCT 26.9* 27.1* 27.3*  PLT 206 189 214    Coag's  Recent Labs Lab 02/16/17 1421 02/18/17 1415  APTT 35  --    INR 1.40 1.39    Sepsis Markers  Recent Labs Lab 02/16/17 1439 02/17/17 0327 02/20/17 1634  LATICACIDVEN 2.46* 1.4 1.6    ABG No results for input(s): PHART, PCO2ART, PO2ART in the last 168 hours.  Liver Enzymes  Recent Labs Lab 02/16/17 1421 02/18/17 1415  AST 30 19  ALT 22 18  ALKPHOS 105 95  BILITOT 2.9* 2.3*  ALBUMIN 2.8* 2.7*    Cardiac Enzymes  Recent Labs Lab 02/17/17 1103 02/17/17 1529 02/17/17 2136  TROPONINI <0.03 <0.03 <0.03    Glucose  Recent Labs Lab 02/20/17 0759 02/20/17 1638 02/20/17 2005 02/21/17 0002 02/21/17 0331 02/21/17 0802  GLUCAP 124* 171* 222* 118* 145* 106*    Imaging No results found.   STUDIES:  CXR 6/24 > Cardiomegaly, moderate to severe degree, central vascular pulmonary congestion and probable mild perihilar interstitial edema suggesting mild CHF/volume overload  XR right Knee 6/24 > No Acute ECHO 6/24 >>  LVEF 30-35%, RV mod dilated, severe hypokinesis, LA dilated, mild MR, RA mod dilated CT abdomen/pelvis 6/26> notable ascites, hepatic steatosis, large uterine fibroids vs peritoneal mets Paracentesis 6/26 > 2.5 L fluid removed, cytology negative EGD 6/28 >> intense  distal esophagitis with focal hemorrhagic hemorrhage, small hiatal hernia  CULTURES: Blood 6/24 >> coag neg staph 2/4 Peritoneal fluid 6/26 > neg  ANTIBIOTICS: Vancomycin 6/24 > 6/28 Rocephin 6/24 >>  SIGNIFICANT EVENTS: 6/24  Presents to ED  6/29  Off vasopressors  LINES/TUBES: PIV   DISCUSSION: 56 y/o female admitted with critical anemia in the setting of non-brisk GI bleeding, found to have shock, presumed AKI and systolic heart failure with RV overload.  Has required vasopressor support for presumed mixed hypovolemic (hemorrhagic) and cardiogenic shock.  Noted on CT abdomen to have large uterine fibroids and ascites.  ASSESSMENT / PLAN:  PULMONARY A: Cough, chest congestion Probable sleep apnea, intolerant of CPAP in hospital P:    O2 to support sats > 905 Pulmonary hygiene Will need outpatient sleep study Continue Robitussin  CARDIOVASCULAR A:  Cardiogenic shock > hypotensive again on 6/28 RV overload/cor pulmonale Acute systolic heart failure, ddx ischemic vs non-ischemic (nutritional related?) Afib with RVR > better P:  Cardiology following, appreciate input  Tele monitoring  Trend CVP Consider LHC / RHC once Sr Cr stabilized  Accept MAP >55 with good mental status and UOP  Continue amiodarone for now  Nutrition vitally important  Fluid / Na restriction   RENAL A:   Acute Kidney Injury, presumed (unclear baseline) > improving P:   KVO  Trend BMP / UOP  Replace electrolytes as indicated  GASTROINTESTINAL A:   Severe protein-calorie malnutrition: Kwashiorkor; prealbumin < 5, history supports poor po intake for months Slow GI bleeding Some upper gi bleeding with vomiting Hepatic steatosis Ascites > cytology negative Peritoneal mets vs uterine fibroids P:   GI following, appreciate input EGD as above  PPI BID  Continue to push nutrition  Thiamine + folic acid Pt wishes to defer colonoscopy until later time  HEMATOLOGIC A:   Anemia, bleeding appears to be out of proportion to blood per stool Little evidence of hemoylsis: Coombs neg, haptoglobin wnl P:  Trend CBC  Transfuse for Hgb <7  INFECTIOUS A:   Skin breakdown/wounds present on admission Cellulitis Probable blood culture contamination (staph epi 2/4 cultures) P:   Wound care  Follow cultures / fever curve  Continue rocephin   Assess HIV status  ENDOCRINE A:   Mild hyperglycemia Hypothyroid > TSH 11.7 P:   Synthroid  Follow up TSH in 6 weeks > around 03/26/17 SSI   NEUROLOGIC A:   Muscular deconditioning P:   PT efforts    FAMILY  - Updates: no family at bedside on 6/29 rounding.  Patient updated on plan of care  - Inter-disciplinary family meet or Palliative Care meeting due by:  02/23/2017.  - Global:  She  will likely need short term rehab efforts and should not go back to her prior living situation  PCCM will be available PRN.  Please call back if new needs arise.   [name], NP-C [facility] Pgr: [phone] or if no answer [phone] 02/21/2017, 10:16 AM
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
## 2017-02-21 NOTE — Progress Notes (Signed)
PROGRESS NOTE  Samantha Richards  KAJ:[id number] DOB: [birth year] DOA: 02/16/2017 PCP: Patient, No Pcp Per   Brief Narrative: Samantha Richards is a 56 y.o. female who presented on 6/24 after a fall at home found to be in hemorrhagic/hypovolemic and cardiogenic shock, severely anemic in the setting of weeks of slow GI bleed and Hgb 3.7. Transfusions and BiPAP provided in addition to vancomycin/ceftriaxone for cellulitis of the back. EGD 6/28 showed gastritis and esophagitis, and pt declined colonoscopy. Levophed was weaned 6/28 and the patient was transferred to hospitalist service 6/29. AFib with RVR has been managed by cardiology, and has converted to sinus rhythm. EF 30% with evidence of RV overload/cor pulmonale. She appears severely malnourished with ascites, paracentesis 6/26 of 2.5L fluid with negative cytology and culture.   Assessment & Plan: Active Problems:   GI bleed   Pressure injury of skin   Anemia   Ascites   Generalized weakness   Symptomatic anemia   Acute on chronic combined systolic and diastolic CHF (congestive heart failure) (HCC)   Atypical atrial flutter (HCC)  Cardiogenic shock with acute systolic CHF and RV overload/cor pulmonale: Improving, off pressors since 6/28. Cortisol 25.8. Echo showed RV overload, diffusely hypokinetic with LVEF 30-35%. Net up 5L. - No current orders for diuresis or IVF, CVP ok  - Cardiology considering LHC/RHC - Monitor co-ox, still low due to anemia and impaired CO. 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 pylori - Declined colonoscopy, so GI signed off. Will continue efforts to explain the benefits of this procedure as she improves clinically.  - Continue PPI - Work-up for non-hemorrhagic cause of anemia included negative haptoglobin and DAT, indirect bili wnl. Will order reticulocyte/anemia panel in AM, consider  hematology consult  AFib with RVR:  - Per cardiology.  - Not a candidate for anticoagulation with profound anemia/GI bleeding from gastritis. - Continuing amiodarone, converted to NSR  Severe protein-calorie malnutrition with kwashiorkor: Prealbumin undetectable, transudative ascites tapped 6/26. Likely underlying mechanism of most problems. - Nutrition consulted, protein supplementation - Continue supplements - Has been hyperglycemic: Check HbA1c  Muscular deconditioning:  - PT consulted, anticipate only safe discharge currently would be to SNF, but will continue to evaluate.  Renal impairment: Uncertain baseline, but SCr has improved modestly from admission 1.96 to appearing stable ~1.8. Mild acidosis suggestive of CKD.  - Monitor BMP, UOP  Suspected OSA:  - CPAP while in hospital - Outpatient sleep study - Monitor pulse oximetry and nocturnal oxygen as needed  Cellulitis: Reactive leukocytosis vs. due to cellulitis. - Monitor blood cultures (suspected S. epidermidis contaminant initially) - Continue ceftriaxone for nonpurulent cellulitis, vancomycin stopped 6/28. - Agree with checking HIV   Hypothyroidism: TSH 11.7. Uncertain how much is due to dietary iodine deficiency.  - Started synthroid - Will need recheck in 4 weeks.   DVT prophylaxis: SCDs Code Status: Full Family Communication: None at bedside Disposition Plan: SNF when stable for discharge  Consultants:   PCCM primary until 6/29  [facility], signed off 6/29  Cardiology  Procedures:  - ECHO 6/24 >>  LVEF 30-35%, RV mod dilated, severe hypokinesis, LA dilated, mild MR, RA mod dilated - Paracentesis 6/26 > 2.5 L fluid removed, cytology negative - EGD 6/28 >> intense distal esophagitis with focal hemorrhagic hemorrhage, small hiatal hernia  Antimicrobials:  Vancomycin 6/24 - 6/28  Ceftriaxone 6/24 >>    Subjective: Feeling better everyday,  eating. Weak. No chest pain or dyspnea.   Objective: Vitals:     02/21/17 0437 02/21/17 0500 02/21/17 0600 02/21/17 0700  BP:  90/61 97/68   Pulse:  (!) 104 (!) 105   Resp:  (!) 9 (!) 23   Temp:  98.8 F (37.1 C) 99 F (37.2 C) 98.2 F (36.8 C)  TempSrc:      SpO2:  98% 97%   Weight: 113.8 kg (250 lb 14.1 oz)     Height:        Intake/Output Summary (Last 24 hours) at 02/21/17 9379 Last data filed at 02/21/17 0600  Gross per 24 hour  Intake           752.92 ml  Output              840 ml  Net           -87.08 ml   Filed Weights   02/19/17 0421 02/20/17 0500 02/21/17 0437  Weight: 111.9 kg (246 lb 11.1 oz) 114 kg (251 lb 5.2 oz) 113.8 kg (250 lb 14.1 oz)    Examination: General exam: 56 y.o. female in no distress Respiratory system: Non-labored breathing room air. Clear to auscultation bilaterally.  Cardiovascular system: Regular tachycardia, no murmur. + JVD. Diffuse 2+ pitting edema in extremities and abd wall.  Gastrointestinal system: Abdomen non-tender, mildly distended but soft, with normoactive bowel sounds. No organomegaly or masses felt. Central nervous system: Alert and oriented. No focal neurological deficits. Extremities: Warm, no deformities Skin: Lower back dressing c/d/i.  Psychiatry: Judgement and insight appear fair. Mood & affect appropriate.   Data Reviewed: I have personally reviewed following labs and imaging studies  CBC:  Recent Labs Lab 02/16/17 1421  02/18/17 0432 02/18/17 2011 02/19/17 0424 02/20/17 0517 02/21/17 0310  WBC 19.1*  < > 21.2* 24.4* 21.5* 19.3* 20.6*  NEUTROABS 16.6*  --  18.2* 20.5* 18.3*  --   --   HGB 3.7*  < > 7.7* 8.5* 8.4* 8.3* 8.3*  HCT 14.2*  < > 24.5* 27.3* 26.9* 27.1* 27.3*  MCV 60.9*  < > 74.2* 74.2* 73.9* 74.0* 74.4*  PLT 288  < > 173 208 206 189 214  < > = values in this interval not displayed. Basic Metabolic Panel:  Recent Labs Lab 02/17/17 0327 02/17/17 1529 02/18/17 0432 02/19/17 0424 02/20/17 0517 02/21/17 0310  NA 142 140 141 140 142 142  K 4.2 4.0 4.0 3.8  3.6 3.4*  CL 113* 114* 114* 113* 113* 115*  CO2 17* 18* 18* 18* 18* 18*  GLUCOSE 141* 161* 148* 140* 141* 140*  BUN 59* 62* 65* 62* 59* 60*  CREATININE 1.96* 2.05* 1.99* 1.97* 1.89* 1.86*  CALCIUM 9.0 8.7* 8.6* 8.6* 8.7* 8.5*  MG 2.1  --   --  2.3  --   --   PHOS 4.4  --   --   --   --   --    GFR: Estimated Creatinine Clearance: 44 mL/min (A) (by C-G formula based on SCr of 1.86 mg/dL (H)). Liver Function Tests:  Recent Labs Lab 02/16/17 1421 02/18/17 1415  AST 30 19  ALT 22 18  ALKPHOS 105 95  BILITOT 2.9* 2.3*  PROT 6.2* 6.0*  ALBUMIN 2.8* 2.7*   No results for input(s): LIPASE, AMYLASE in the last 168 hours. No results for input(s): AMMONIA in the last 168 hours. Coagulation Profile:  Recent Labs Lab 02/16/17 1421 02/18/17 1415  INR 1.40 1.39   Cardiac  Enzymes:  Recent Labs Lab 02/17/17 0327 02/17/17 1103 02/17/17 1529 02/17/17 2136  TROPONINI <0.03 <0.03 <0.03 <0.03   BNP (last 3 results) No results for input(s): PROBNP in the last 8760 hours. HbA1C: No results for input(s): HGBA1C in the last 72 hours. CBG:  Recent Labs Lab 02/20/17 1638 02/20/17 2005 02/21/17 0002 02/21/17 0331 02/21/17 0802  GLUCAP 171* 222* 118* 145* 106*   Lipid Profile: No results for input(s): CHOL, HDL, LDLCALC, TRIG, CHOLHDL, LDLDIRECT in the last 72 hours. Thyroid Function Tests:  Recent Labs  02/18/17 1415  TSH 11.713*   Anemia Panel: No results for input(s): VITAMINB12, FOLATE, FERRITIN, TIBC, IRON, RETICCTPCT in the last 72 hours. Urine analysis:    Component Value Date/Time   COLORURINE YELLOW 02/16/2017 1845   APPEARANCEUR CLEAR 02/16/2017 1845   LABSPEC 1.013 02/16/2017 1845   PHURINE 5.0 02/16/2017 1845   GLUCOSEU NEGATIVE 02/16/2017 1845   HGBUR NEGATIVE 02/16/2017 1845   BILIRUBINUR NEGATIVE 02/16/2017 1845   KETONESUR NEGATIVE 02/16/2017 1845   PROTEINUR NEGATIVE 02/16/2017 1845   NITRITE NEGATIVE 02/16/2017 1845   LEUKOCYTESUR NEGATIVE  02/16/2017 1845   Recent Results (from the past 240 hour(s))  Culture, blood (routine x 2)     Status: Abnormal   Collection Time: 02/16/17  2:10 PM  Result Value Ref Range Status   Specimen Description BLOOD RIGHT ANTECUBITAL  Final   Special Requests   Final    BOTTLES DRAWN AEROBIC AND ANAEROBIC Blood Culture adequate volume   Culture  Setup Time   Final    GRAM POSITIVE COCCI IN CLUSTERS IN BOTH AEROBIC AND ANAEROBIC BOTTLES CRITICAL RESULT CALLED TO, READ BACK BY AND VERIFIED WITH: L. [place], [place] 02/17/2017 T. TYSOR    Culture (A)  Final    STAPHYLOCOCCUS SPECIES (COAGULASE NEGATIVE) THE SIGNIFICANCE OF ISOLATING THIS ORGANISM FROM A SINGLE SET OF BLOOD CULTURES WHEN MULTIPLE SETS ARE DRAWN IS UNCERTAIN. PLEASE NOTIFY THE MICROBIOLOGY DEPARTMENT WITHIN ONE WEEK IF SPECIATION AND SENSITIVITIES ARE REQUIRED. Performed at [facility], [place] [address] [place], [place] [address]    Report Status 02/19/2017 FINAL  Final  Culture, blood (routine x 2)     Status: None (Preliminary result)   Collection Time: 02/16/17  2:21 PM  Result Value Ref Range Status   Specimen Description BLOOD LEFT ANTECUBITAL  Final   Special Requests   Final    BOTTLES DRAWN AEROBIC AND ANAEROBIC Blood Culture results may not be optimal due to an inadequate volume of blood received in culture bottles   Culture   Final    NO GROWTH 4 DAYS Performed at [facility], [place] [address] [place], [place] [address]    Report Status PENDING  Incomplete  Blood Culture ID Panel (Reflexed)     Status: Abnormal   Collection Time: 02/16/17  2:21 PM  Result Value Ref Range Status   Enterococcus species NOT DETECTED NOT DETECTED Final   Listeria monocytogenes NOT DETECTED NOT DETECTED Final   Staphylococcus species DETECTED (A) NOT DETECTED Final    Comment: Methicillin (oxacillin) resistant coagulase negative staphylococcus. Possible blood culture contaminant (unless isolated from more than one  blood culture draw or clinical case suggests pathogenicity). No antibiotic treatment is indicated for blood  culture contaminants. CRITICAL RESULT CALLED TO, READ BACK BY AND VERIFIED WITH: L. Wynona Canes, [place] 02/17/2017 T. TYSOR    Staphylococcus aureus NOT DETECTED NOT DETECTED Final   Methicillin resistance DETECTED (A) NOT DETECTED Final  Comment: CRITICAL RESULT CALLED TO, READ BACK BY AND VERIFIED WITH: L. Wynona Canes, [place] 02/17/2017 T. TYSOR    Streptococcus species NOT DETECTED NOT DETECTED Final   Streptococcus agalactiae NOT DETECTED NOT DETECTED Final   Streptococcus pneumoniae NOT DETECTED NOT DETECTED Final   Streptococcus pyogenes NOT DETECTED NOT DETECTED Final   Acinetobacter baumannii NOT DETECTED NOT DETECTED Final   Enterobacteriaceae species NOT DETECTED NOT DETECTED Final   Enterobacter cloacae complex NOT DETECTED NOT DETECTED Final   Escherichia coli NOT DETECTED NOT DETECTED Final   Klebsiella oxytoca NOT DETECTED NOT DETECTED Final   Klebsiella pneumoniae NOT DETECTED NOT DETECTED Final   Proteus species NOT DETECTED NOT DETECTED Final   Serratia marcescens NOT DETECTED NOT DETECTED Final   Haemophilus influenzae NOT DETECTED NOT DETECTED Final   Neisseria meningitidis NOT DETECTED NOT DETECTED Final   Pseudomonas aeruginosa NOT DETECTED NOT DETECTED Final   Candida albicans NOT DETECTED NOT DETECTED Final   Candida glabrata NOT DETECTED NOT DETECTED Final   Candida krusei NOT DETECTED NOT DETECTED Final   Candida parapsilosis NOT DETECTED NOT DETECTED Final   Candida tropicalis NOT DETECTED NOT DETECTED Final  MRSA PCR Screening     Status: None   Collection Time: 02/16/17  9:33 PM  Result Value Ref Range Status   MRSA by PCR NEGATIVE NEGATIVE Final    Comment:        The GeneXpert MRSA Assay (FDA approved for NASAL specimens only), is one component of a comprehensive MRSA colonization surveillance program. It is not intended to diagnose  MRSA infection nor to guide or monitor treatment for MRSA infections.   Acid Fast Smear (AFB)     Status: None   Collection Time: 02/18/17 12:00 PM  Result Value Ref Range Status   AFB Specimen Processing Concentration  Final   Acid Fast Smear Negative  Final    Comment: (NOTE) Performed At: [facility] [address] [place], [place] [id number] Lindon Romp MD EQ:[id number]    Source (AFB) PERITONEAL  Final  Culture, body fluid-bottle     Status: None (Preliminary result)   Collection Time: 02/18/17 12:00 PM  Result Value Ref Range Status   Specimen Description PERITONEAL  Final   Special Requests NONE  Final   Culture   Final    NO GROWTH 2 DAYS Performed at [facility], [address] [place], [place] [address]    Report Status PENDING  Incomplete  Gram stain     Status: None   Collection Time: 02/18/17 12:00 PM  Result Value Ref Range Status   Specimen Description PERITONEAL  Final   Special Requests NONE  Final   Gram Stain   Final    RARE WBC PRESENT, PREDOMINANTLY MONONUCLEAR NO ORGANISMS SEEN Performed at [facility], [address] [place], [place] [address]    Report Status 02/18/2017 FINAL  Final      Radiology Studies: No results found.  Scheduled Meds: . Chlorhexidine Gluconate Cloth  6 each Topical Daily  . furosemide  40 mg Intravenous Once  . insulin aspart  0-15 Units Subcutaneous Q4H  . levothyroxine  100 mcg Oral QAC breakfast  . mouth rinse  15 mL Mouth Rinse BID  . multivitamin with minerals  1 tablet Oral Daily  . pantoprazole  40 mg Oral BID  . sodium chloride flush  10-40 mL Intracatheter Q12H  . thiamine  100 mg Intravenous Daily   Continuous Infusions: . sodium  chloride     Followed by  . sodium chloride Stopped (02/16/17 1518)  . sodium chloride    . sodium chloride    . amiodarone 30 mg/hr (02/21/17 0729)  . cefTRIAXone (ROCEPHIN)  IV 2 g (02/21/17 0902)  . DOBUTamine Stopped (02/18/17 1020)  .  norepinephrine (LEVOPHED) Adult infusion Stopped (02/20/17 1430)     LOS: 5 days   Time spent: 25 minutes.  Vance Gather, MD [facility] Pager [phone]  If 7PM-7AM, please contact night-coverage [email or web address] Password [email or web address] 02/21/2017, 9:07 AM

## 2017-02-21 NOTE — Evaluation (Signed)
Physical Therapy Evaluation Patient Details Name: Samantha Richards MRN: [id number] DOB: 03-13-1961 Today's Date: 02/21/2017   History of Present Illness  56 y/o female with obesity admitted on 6/25 with anemia after a fall also found to have Acute systolic CHF and anasarca  Clinical Impression  Pt admitted with above diagnosis. Pt currently with functional limitations due to the deficits listed below (see PT Problem List). Pt will benefit from skilled PT to increase their independence and safety with mobility to allow discharge to the venue listed below.   Pt requiring increased assist for OOB to recliner.  Pt unable to ambulate today.  Recommend SNF and pt agreeable.     Follow Up Recommendations SNF    Equipment Recommendations  Wheelchair (measurements PT);Hospital bed    Recommendations for Other Services       Precautions / Restrictions Precautions Precautions: Fall Precaution Comments: BP has been running low      Mobility  Bed Mobility Overal bed mobility: Needs Assistance Bed Mobility: Supine to Sit     Supine to sit: Max assist;HOB elevated;+2 for physical assistance     General bed mobility comments: verbal cues for technique, assist for lower body and some for upper body upright, pt requesting assist and encouraged her to perform as much as possible however requiring assistance  Transfers Overall transfer level: Needs assistance Equipment used: Rolling walker (2 wheeled) Transfers: Sit to/from [facility] Sit to Stand: Mod assist;+2 physical assistance Stand pivot transfers: Min assist;+2 physical assistance       General transfer comment: verbal cues for safe technique, difficulty performing rise and controlling descent, encouraged WBing through UEs on RW   Ambulation/Gait                Stairs            Wheelchair Mobility    Modified Rankin (Stroke Patients Only)       Balance Overall balance assessment: History of  Falls                                           Pertinent Vitals/Pain Pain Assessment: 0-10 Pain Score: 7  Pain Location: L LE (knee - fall prior to admission, xray negative) Pain Descriptors / Indicators: Tender Pain Intervention(s): Limited activity within patient's tolerance;Monitored during session;Repositioned    Home Living Family/patient expects to be discharged to:: Skilled nursing facility                 Additional Comments: poor previous living situation (see CSW note), pt did not state much about living situation however agreeable to SNF    Prior Function Level of Independence: Independent               Hand Dominance        Extremity/Trunk Assessment        Lower Extremity Assessment Lower Extremity Assessment: Generalized weakness;RLE deficits/detail;LLE deficits/detail RLE Deficits / Details: decreased ability to perform flexion - likely combination of obesity, weakness, and anasarca LLE Deficits / Details: decreased ability to perform flexion - likely combination of obesity, weakness, and anasarca (especially knee flexion due to knee pain)       Communication   Communication: No difficulties  Cognition Arousal/Alertness: Awake/alert Behavior During Therapy: WFL for tasks assessed/performed Overall Cognitive Status: Within Functional Limits for tasks assessed  General Comments      Exercises     Assessment/Plan    PT Assessment Patient needs continued PT services  PT Problem List Decreased balance;Decreased strength;Decreased mobility;Decreased knowledge of use of DME;Cardiopulmonary status limiting activity;Decreased activity tolerance;Obesity;Decreased skin integrity       PT Treatment Interventions DME instruction;Gait training;Therapeutic exercise;Therapeutic activities;Patient/family education;Functional mobility training    PT Goals (Current goals can be  found in the Care Plan section)  Acute Rehab PT Goals PT Goal Formulation: With patient Time For Goal Achievement: 03/07/17 Potential to Achieve Goals: Fair    Frequency Min 3X/week   Barriers to discharge        Co-evaluation               AM-PAC PT "6 Clicks" Daily Activity  Outcome Measure Difficulty turning over in bed (including adjusting bedclothes, sheets and blankets)?: Total Difficulty moving from lying on back to sitting on the side of the bed? : Total Difficulty sitting down on and standing up from a chair with arms (e.g., wheelchair, bedside commode, etc,.)?: Total Help needed moving to and from a bed to chair (including a wheelchair)?: A Lot Help needed walking in hospital room?: Total Help needed climbing 3-5 steps with a railing? : Total 6 Click Score: 7    End of Session Equipment Utilized During Treatment: Gait belt Activity Tolerance: Patient limited by fatigue Patient left: in chair;with call bell/phone within reach Nurse Communication: Mobility status PT Visit Diagnosis: Difficulty in walking, not elsewhere classified (R26.2);Muscle weakness (generalized) (M62.81)    Time: 1308-6578 PT Time Calculation (min) (ACUTE ONLY): 22 min   Charges:   PT Evaluation $PT Eval Moderate Complexity: 1 Procedure     PT G Codes:        Carmelia Bake, PT, DPT 02/21/2017 Pager: [phone]   York Ram E 02/21/2017, 12:52 PM

## 2017-02-21 NOTE — Progress Notes (Signed)
Nutrition Follow-up  DOCUMENTATION CODES:   Obesity unspecified  INTERVENTION:  - Will add 2L fluid restriction to Heart Healthy diet order. - Will order Ensure Enlive TID, each supplement provides 350 kcal and 20 grams of protein - Continue to encourage PO intakes of meals and supplements. - Discussed appropriate protein sources with pt.   NUTRITION DIAGNOSIS:   Inadequate oral intake related to acute illness, poor appetite as evidenced by per patient/family report. -ongoing  GOAL:   Patient will meet greater than or equal to 90% of their needs -unmet at this time.   MONITOR:   PO intake, Supplement acceptance, Weight trends, Labs, Skin, I & O's  ASSESSMENT:   56 year old female with PMH of Obesity presents to ED on 6/24 with complaints of weakness and pain for the last 3 days. 2 weeks prior had a mechanical fall which resulted in right knee pain. Patient reports that she has not had anything to eat or drink as she has been laying in bed for 3 days unable to move, reports she lives with a roommate however they refused to help. EMS arrived to house on 6/21 and found patient laying face down on an air-mattress, however refused help. EMS reports that living situation was poor and house was in un-livable conditions.  No intakes documented since diet advancement. Pt states she ate most of dinner last night which was baked fish, rice, and corn. She had not yet ordered breakfast but requested RD order for her: scrambled egg, grits, cinnamon raisin bagel. Talked with pt about protein sources that are in line with Heart Healthy diet; used examples of foods based on pt's diet recall from the past few meals and also her food preferences. Pt was drinking Equate brand protein shakes PTA and likes strawberry flavor. She is very interested in ONS during admission. Encouraged pt to use supplement to take medications. Pt very appreciative of information and very engaged in doing what she needs to in  order to maintain overall health.   She had EGD yesterday with GI note stating: intense distal esophagitis with focal hemorrhagic appearance no definite esophageal ulcer with small hiatal hernia. Cardiology PA note from this AM states: total body anasarca significantly improved. Weight today stable with weight yesterday. Current weight is +6 kg from admission.  Medications reviewed; sliding scale Novolog, 100 mcg oral Synthroid/day 40 mg oral Protonix BID, 40 mEq oral KCl x1 dose today, 100 mg IV thaimine/day, daily multivitamin with minerals. Labs reviewed; CBGs: 118, 146, and 106 mg/dL this AM, K: 3.4 mmol/L, Cl: 115 mmol/L, BUN: 60 mg/dL, creatinine: 1.86 mg/dL, Ca: 8.5 mg/dL, GFR: 35 mL/min.    Diet Order:  Diet Heart Room service appropriate? Yes; Fluid consistency: Thin; Fluid restriction: 2000 mL Fluid  Skin:  Wound (see comment) (Stage 2 bilateral buttocks pressure injuries)  Last BM:  6/25  Height:   Ht Readings from Last 1 Encounters:  02/16/17 5\' 5"  (1.651 m)    Weight:   Wt Readings from Last 1 Encounters:  02/21/17 250 lb 14.1 oz (113.8 kg)    Ideal Body Weight:  56.82 kg  BMI:  Body mass index is 41.75 kg/m.  Estimated Nutritional Needs:   Kcal:  1620-1940 (15-18 kcal/kg)  Protein:  90-100 grams  Fluid:  per MD given severe edema, GIB  EDUCATION NEEDS:   Education needs addressed    Jarome Matin, MS, RD, LDN, CNSC Inpatient Clinical Dietitian Pager # [phone] After hours/weekend pager # [phone]

## 2017-02-21 NOTE — Progress Notes (Signed)
Pt requested to remain in chair.  She had bouts of nausea after eating lunch.  zofran improved.  After attempting twice for patient to pull up and use walker to return to bed...she was unable to pull up.  Very weak.  VS stable.  Needed to use maximove to return patient to bed.  Pt safely returned to bed. VS stable.  Emotional support provided as she expressed concern about being lifted.

## 2017-02-21 NOTE — Progress Notes (Signed)
[facility] Progress Note Patient Name: Samantha Richards DOB: 08-27-1960 MRN: [id number]   Date of Service  02/21/2017  HPI/Events of Note  Hypokalemia  eICU Interventions  Potassium replaced     Intervention Category Minor Interventions: Electrolytes abnormality - evaluation and management  DETERDING,ELIZABETH 02/21/2017, 4:19 AM

## 2017-02-21 NOTE — Progress Notes (Signed)
Progress Note  Patient Name: Samantha Richards Date of Encounter: 02/21/2017  Primary Cardiologist: None  Subjective   Doing better this morning, continues to diurese well she says urinating a lot and passing gas.  She is in sinus tachycardia this morning. No longer on pressures.  Reports waking up from sleep coughing and gasping for air.  New severe productive cough this AM   Inpatient Medications    Scheduled Meds: . Chlorhexidine Gluconate Cloth  6 each Topical Daily  . furosemide  40 mg Intravenous Once  . insulin aspart  0-15 Units Subcutaneous Q4H  . levothyroxine  100 mcg Oral QAC breakfast  . mouth rinse  15 mL Mouth Rinse BID  . multivitamin with minerals  1 tablet Oral Daily  . pantoprazole  40 mg Oral BID  . sodium chloride flush  10-40 mL Intracatheter Q12H  . thiamine  100 mg Intravenous Daily   Continuous Infusions: . sodium chloride     Followed by  . sodium chloride Stopped (02/16/17 1518)  . sodium chloride    . sodium chloride    . amiodarone 30 mg/hr (02/21/17 0729)  . cefTRIAXone (ROCEPHIN)  IV Stopped (02/20/17 0912)  . DOBUTamine Stopped (02/18/17 1020)  . norepinephrine (LEVOPHED) Adult infusion Stopped (02/20/17 1430)   PRN Meds: sodium chloride, guaiFENesin-dextromethorphan, iopamidol, lip balm, ondansetron (ZOFRAN) IV, promethazine, sodium chloride flush   Vital Signs    Vitals:   02/21/17 0437 02/21/17 0500 02/21/17 0600 02/21/17 0700  BP:  90/61 97/68   Pulse:  (!) 104 (!) 105   Resp:  (!) 9 (!) 23   Temp:  98.8 F (37.1 C) 99 F (37.2 C) 98.2 F (36.8 C)  TempSrc:      SpO2:  98% 97%   Weight: 250 lb 14.1 oz (113.8 kg)     Height:        Intake/Output Summary (Last 24 hours) at 02/21/17 0851 Last data filed at 02/21/17 0600  Gross per 24 hour  Intake           769.62 ml  Output              840 ml  Net           -70.38 ml   Filed Weights   02/19/17 0421 02/20/17 0500 02/21/17 0437  Weight: 246 lb 11.1 oz (111.9 kg) 251 lb  5.2 oz (114 kg) 250 lb 14.1 oz (113.8 kg)    Telemetry    Sinus tachycardia, rates 100-110 - Personally Reviewed   Physical Exam   GEN: Well nourished, well developed HEENT: normal  Neck: no JVD, carotid bruits, or masses Cardiac: Regular rhythm and tachycardia. Possible diastolic murmur, rubs, or gallops,. Intact distal pulses bilaterally.  Improved edema Respiratory: clear to auscultation bilaterally, + increased effort of breathing and productive cough. GI: soft, nontender, nondistended, + BS MS: no deformity or atrophy  Skin: warm and dry, no rash Neuro: Alert and Oriented x 3, Strength and sensation are intact Psych:   Full affect  Labs    Chemistry Recent Labs Lab 02/16/17 1421  02/18/17 1415 02/19/17 0424 02/20/17 0517 02/21/17 0310  NA 140  < >  --  140 142 142  K 4.2  < >  --  3.8 3.6 3.4*  CL 112*  < >  --  113* 113* 115*  CO2 17*  < >  --  18* 18* 18*  GLUCOSE 119*  < >  --  140* 141* 140*  BUN 52*  < >  --  62* 59* 60*  CREATININE 1.96*  < >  --  1.97* 1.89* 1.86*  CALCIUM 9.1  < >  --  8.6* 8.7* 8.5*  PROT 6.2*  --  6.0*  --   --   --   ALBUMIN 2.8*  --  2.7*  --   --   --   AST 30  --  19  --   --   --   ALT 22  --  18  --   --   --   ALKPHOS 105  --  95  --   --   --   BILITOT 2.9*  --  2.3*  --   --   --   GFRNONAA 28*  < >  --  28* 29* 30*  GFRAA 32*  < >  --  32* 34* 35*  ANIONGAP 11  < >  --  9 11 9   < > = values in this interval not displayed.   Hematology Recent Labs Lab 02/19/17 0424 02/20/17 0517 02/21/17 0310  WBC 21.5* 19.3* 20.6*  RBC 3.64* 3.66* 3.67*  HGB 8.4* 8.3* 8.3*  HCT 26.9* 27.1* 27.3*  MCV 73.9* 74.0* 74.4*  MCH 23.1* 22.7* 22.6*  MCHC 31.2 30.6 30.4  RDW 28.8* 29.3* 29.8*  PLT 206 189 214    Cardiac Enzymes Recent Labs Lab 02/17/17 0327 02/17/17 1103 02/17/17 1529 02/17/17 2136  TROPONINI <0.03 <0.03 <0.03 <0.03   No results for input(s): TROPIPOC in the last 168 hours.   BNP Recent Labs Lab  02/16/17 1516  BNP 923.6*     DDimer No results for input(s): DDIMER in the last 168 hours.   Radiology    No results found.  Cardiac Studies   Transthoracic Echo (02/17/2017) - Left ventricle: Septal flattening consistant with elevated RV pressures. Systolic function was moderately to severely reduced. The estimated ejection fraction was in the range of 30% to 35%. Diffuse hypokinesis. - Mitral valve: There was mild regurgitation. - Left atrium: The atrium was mildly dilated. - Right ventricle: The cavity size was moderately dilated. - Right atrium: The atrium was moderately dilated. - Atrial septum: No defect or patent foramen ovale was identified. - Tricuspid valve: There was moderate-severe regurgitation. - Pericardium, extracardiac: A trivial pericardial effusion was identified. - Impressions: Suspect PA pressure underestimated by TR velocity due to RV dysfunction. There is moderate RV enlargement with severe hypokinesis and signs of significant cor pulmonale.  Impressions:  - Suspect PA pressure underestimated by TR velocity due to RV dysfunction. There is moderate RV enlargement with severe hypokinesis and signs of significant cor pulmonale  Patient Profile     Samantha Richards not sought medical care in years and does not have a known past medical history aside from obesity. She denies ETOH, drug use and is a never smoker.  She is being seen for the evaluation of atrial fibrillation with RVR and RV overload/cor pulmonaleat the request of Dr. Lake Bells, Critical Care.   Assessment & Plan     1. Acute systolic CHF: EF 43% by echo, total body anasarca significantly improved Seems to be inproving . HR is tachycardic Continue supportive care. 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 Will add CHF meds as tolerated.    2. Cough: Congested and crackly cough this AM with sputum. Chest xray ordered.  3.OSA:  Will need sleep study at discharge,  reports waking up and gasping for air. WILLING TO  TRY CPAP AGAIN TONIGHT.  4 Cor pulmonale: pt intolerant of CPAP, but willing to try again.  will need outpatient sleep study. Continue to diurese.  5. Anemia: s/p transfusion:  EGD today shows gastritis and esophagitis.  Starting protonix   6. Atrial flutter w/ RVR: Currently back in sinus rhythm on amiodarone for rate control. Rates 70-115. No longer on pressures.  -- Repeat ECG ordered state for documentation of conversion to sinus rhythm    Signed, Linus Mako, PA-C  02/21/2017, 8:51 AM     Attending Note:   The patient was seen and examined.  Agree with assessment and plan as noted above.  Changes made to the above note as needed.  Patient seen and independently examined with Delos Haring, PA .   We discussed all aspects of the encounter. I agree with the assessment and plan as stated above.  1.   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 Has not had any angina . Will be adding low dose beta blocker as tolerated.  Could try metoprolol 12.5 bid later today if BP stays up  2. Atrial flutter:   Was admitted with profound anemia. Not a candidate for [place] at this point. ECG shows gastritis and esophagitis   Colonoscopy to be done later.        I have spent a total of 30 minutes with patient reviewing hospital  notes , telemetry, EKGs, labs and examining patient as well as establishing an assessment and plan that was discussed with the patient. > 50% of time was spent in direct patient care.     Thayer Headings, Brooke Bonito., MD, [facility] 02/21/2017, 9:53 AM [address]  [place] Pager [phone]

## 2017-02-21 NOTE — Progress Notes (Signed)
[facility] Gastroenterology Progress Note  Samantha Richards 56 y.o. [birth year]  CC:  Anemia, GI bleed   Subjective: Patient is status post EGD yesterday. It showed LA grade D  esophagitis and gastritis. Patient denied any further bleeding episode.  doing better. Denied abdominal pain, nausea and vomiting.  ROS : Negative for chest pain and shortness of breath   Objective: Vital signs in last 24 hours: Vitals:   02/21/17 0600 02/21/17 0700  BP: 97/68   Pulse: (!) 105   Resp: (!) 23   Temp: 99 F (37.2 C) 98.2 F (36.8 C)    Physical Exam:  General:  Alert, cooperative, no distress, appears stated age  Head:  Normocephalic, without obvious abnormality, atraumatic  Eyes:  , EOM's intact,   Lungs:   Bilateral crackles on exam. Anterior exam only. No  distress   Heart:  Regular rate and rhythm, S1, S2 normal  Abdomen:   Abdomen remains mildly Distended with the lower quadrant anasarca, bowel sounds present. No peritoneal signs.   Extremities: 3+ lower Ext edema        Lab Results:  Recent Labs  02/19/17 0424 02/20/17 0517 02/21/17 0310  NA 140 142 142  K 3.8 3.6 3.4*  CL 113* 113* 115*  CO2 18* 18* 18*  GLUCOSE 140* 141* 140*  BUN 62* 59* 60*  CREATININE 1.97* 1.89* 1.86*  CALCIUM 8.6* 8.7* 8.5*  MG 2.3  --   --     Recent Labs  02/18/17 1415  AST 19  ALT 18  ALKPHOS 95  BILITOT 2.3*  PROT 6.0*  ALBUMIN 2.7*    Recent Labs  02/18/17 2011 02/19/17 0424 02/20/17 0517 02/21/17 0310  WBC 24.4* 21.5* 19.3* 20.6*  NEUTROABS 20.5* 18.3*  --   --   HGB 8.5* 8.4* 8.3* 8.3*  HCT 27.3* 26.9* 27.1* 27.3*  MCV 74.2* 73.9* 74.0* 74.4*  PLT 208 206 189 214    Recent Labs  02/18/17 1415  LABPROT 17.2*  INR 1.39      Assessment/Plan: - Symptomatic anemia with hemoglobin of 3.7 on admission, occult blood negative on initial evaluation.  - Hematemesis. Resolved - History of bright blood per rectum as well as dark stools a few days prior to admission - Acute  blood loss anemia.  Status post blood transfusion - Ascites. Status post paracentesis yesterday. SAAG >1.1. Negative for SBP. - Congestive heart failure with EF of 30%. - Leukocytosis with possible underlying sepsis - Acute kidney injury - Shortness of breath probably from CHF and anemia - Hypotension/ cardiogenic shock - Anasarca  Recommendations -------------------------- -   EGD yesterday showed LA grade D  esophagitis and gastritis. Patient denied any further bleeding episode. Patient declining colonoscopy at this time. She is feeling weak and she would like to regain some strength prior to pursuing colonoscopy. Her hemoglobin is stable. She continues to have a tachycardia and borderline blood pressure probably from underlying cardiogenic shock.  - GI will sign off for now. Please call us back when patient is more stable and when she is willing to undergo colonoscopy.   [name] MD, [place] 02/21/2017, 10:03 AM  Pager [phone]  If no answer or after 5 PM call [phone]

## 2017-02-22 ENCOUNTER — Inpatient Hospital Stay (HOSPITAL_COMMUNITY): Payer: Self-pay

## 2017-02-22 LAB — BASIC METABOLIC PANEL
Anion gap: 9 (ref 5–15)
BUN: 59 mg/dL — AB (ref 6–20)
CHLORIDE: 113 mmol/L — AB (ref 101–111)
CO2: 18 mmol/L — ABNORMAL LOW (ref 22–32)
Calcium: 8.7 mg/dL — ABNORMAL LOW (ref 8.9–10.3)
Creatinine, Ser: 1.84 mg/dL — ABNORMAL HIGH (ref 0.44–1.00)
GFR calc non Af Amer: 30 mL/min — ABNORMAL LOW (ref >60)
GFR, EST AFRICAN AMERICAN: 35 mL/min — AB (ref >60)
Glucose, Bld: 160 mg/dL — ABNORMAL HIGH (ref 65–99)
POTASSIUM: 4.1 mmol/L (ref 3.5–5.1)
SODIUM: 140 mmol/L (ref 135–145)

## 2017-02-22 LAB — GLUCOSE, CAPILLARY
GLUCOSE-CAPILLARY: 114 mg/dL — AB (ref 65–99)
GLUCOSE-CAPILLARY: 138 mg/dL — AB (ref 65–99)
GLUCOSE-CAPILLARY: 144 mg/dL — AB (ref 65–99)
GLUCOSE-CAPILLARY: 157 mg/dL — AB (ref 65–99)
Glucose-Capillary: 143 mg/dL — ABNORMAL HIGH (ref 65–99)
Glucose-Capillary: 110 mg/dL — ABNORMAL HIGH (ref 65–99)

## 2017-02-22 LAB — CBC
HCT: 28 % — ABNORMAL LOW (ref 36.0–46.0)
Hemoglobin: 8.5 g/dL — ABNORMAL LOW (ref 12.0–15.0)
MCH: 22.8 pg — ABNORMAL LOW (ref 26.0–34.0)
MCHC: 30.4 g/dL (ref 30.0–36.0)
MCV: 75.3 fL — AB (ref 78.0–100.0)
PLATELETS: 239 10*3/uL (ref 150–400)
RBC: 3.72 MIL/uL — AB (ref 3.87–5.11)
RDW: 30.8 % — ABNORMAL HIGH (ref 11.5–15.5)
WBC: 25.1 10*3/uL — AB (ref 4.0–10.5)

## 2017-02-22 LAB — IRON AND TIBC
Iron: 16 ug/dL — ABNORMAL LOW (ref 28–170)
Saturation Ratios: 4 % — ABNORMAL LOW (ref 10.4–31.8)
TIBC: 438 ug/dL (ref 250–450)
UIBC: 422 ug/dL

## 2017-02-22 LAB — VITAMIN B12: Vitamin B-12: 1202 pg/mL — ABNORMAL HIGH (ref 180–914)

## 2017-02-22 LAB — RETICULOCYTES
RBC.: 3.72 MIL/uL — ABNORMAL LOW (ref 3.87–5.11)
Retic Count, Absolute: 93 10*3/uL (ref 19.0–186.0)
Retic Ct Pct: 2.5 % (ref 0.4–3.1)

## 2017-02-22 LAB — FERRITIN: FERRITIN: 15 ng/mL (ref 11–307)

## 2017-02-22 LAB — FOLATE: Folate: 10.9 ng/mL (ref >5.9)

## 2017-02-22 LAB — PROCALCITONIN: Procalcitonin: 0.44 ng/mL

## 2017-02-22 LAB — HIV ANTIBODY (ROUTINE TESTING W REFLEX): HIV Screen 4th Generation wRfx: NONREACTIVE

## 2017-02-22 MED ORDER — VITAMIN B-1 100 MG PO TABS
100.0000 mg | ORAL_TABLET | Freq: Every day | ORAL | Status: DC
  Administered 2017-02-23 – 2017-03-03 (×8): 100 mg via ORAL
  Filled 2017-02-22 (×10): qty 1

## 2017-02-22 NOTE — Progress Notes (Signed)
PROGRESS NOTE  Samantha Richards  HCW:[id number] DOB: 03-14-1961 DOA: 02/16/2017 PCP: Patient, No Pcp Per   Brief Narrative: Samantha Richards is a 56 y.o. female who presented on 6/24 after a fall at home found to be in hemorrhagic/hypovolemic and cardiogenic shock, severely anemic in the setting of weeks of slow GI bleed and Hgb 3.7. Transfusions and BiPAP provided in addition to vancomycin/ceftriaxone for cellulitis of the back. EGD 6/28 showed gastritis and esophagitis, and pt declined colonoscopy. Levophed was weaned 6/28 and the patient was transferred to hospitalist service 6/29. AFib with RVR has been managed by cardiology, and has converted to sinus rhythm. EF 30% with evidence of RV overload/cor pulmonale. She appears severely malnourished with ascites, paracentesis 6/26 of 2.5L fluid with negative cytology and culture.   Assessment & Plan: Principal Problem:   Acute blood loss anemia Active Problems:   GI bleed   Pressure injury of skin   Ascites   Generalized weakness   Symptomatic anemia   Acute on chronic combined systolic and diastolic CHF (congestive heart failure) (HCC)   Atypical atrial flutter (HCC)   Encounter for central line placement   Cardiogenic shock (HCC)   Severe protein-calorie malnutrition ([place])   Hypothyroidism  Cardiogenic shock with acute systolic CHF and RV overload/cor pulmonale: Improving, off pressors since 6/28. Cortisol 25.8. Echo showed RV overload, diffusely hypokinetic with LVEF 30-35%. Net up 5L. - No current orders for diuresis or IVF, CVP ok  - Cardiology considering LHC/RHC - Monitor co-ox, still low due to anemia and impaired CO. 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 pylori - Pt now amenable to colonoscopy. Will call GI to schedule once more hemodynamically stable or if rebleeding. - Continue PPI - Work-up for  non-hemorrhagic cause of anemia included negative haptoglobin and DAT, indirect bili wnl. Will order reticulocyte/anemia panel in AM, consider hematology consult  AFib with RVR:  - Per cardiology.  - Not a candidate for anticoagulation with profound anemia/GI bleeding from gastritis. - Continuing amiodarone gtt, converted to consistent sinus tachycardia  Severe protein-calorie malnutrition with kwashiorkor: Prealbumin undetectable, transudative ascites tapped 6/26. Likely underlying mechanism of most problems. - Nutrition consulted, protein supplementation - Continue supplements - Has been hyperglycemic: Check HbA1c  Muscular deconditioning:  - PT consulted, anticipate only safe discharge currently would be to SNF, but will continue to evaluate.  Renal impairment: Uncertain baseline, but SCr has improved modestly from admission 1.96 to appearing stable ~1.8. Mild acidosis suggestive of CKD.  - Monitor BMP, UOP  Suspected OSA:  - CPAP while in hospital - Outpatient sleep study - Monitor pulse oximetry and nocturnal oxygen as needed  Cellulitis: Reactive leukocytosis vs. due to cellulitis. - Monitor blood cultures (suspected S. epidermidis contaminant initially) - Continue ceftriaxone for nonpurulent cellulitis, vancomycin stopped 6/28. - Agree with checking HIV   Hypothyroidism: TSH 11.7. Uncertain how much is due to dietary iodine deficiency.  - Started synthroid - Will need recheck in 4 weeks.   DVT prophylaxis: SCDs Code Status: Full Family Communication: None at bedside Disposition Plan: Still requiring amiodarone infusion and soft BP, remain in SDU. SNF when stable for discharge  Consultants:   PCCM primary until 6/29  [facility], signed off 6/29  Cardiology  Procedures:  - ECHO 6/24 >>  LVEF 30-35%, RV mod dilated, severe hypokinesis, LA dilated, mild MR, RA mod dilated - Paracentesis 6/26 > 2.5 L fluid  removed, cytology negative - EGD 6/28 >> intense distal  esophagitis with focal hemorrhagic hemorrhage, small hiatal hernia  Antimicrobials:  Vancomycin 6/24 - 6/28  Ceftriaxone 6/24 >>    Subjective: No complaints this morning, sat up in chair yesterday and was too weak to get back up requiring lift to return to bed. No bleeding. No chest pain, dyspnea, palpitations.   Objective: Vitals:   02/22/17 0200 02/22/17 0400 02/22/17 0600 02/22/17 0800  BP: 100/60 (!) 140/120 92/70   Pulse: 95 (!) 31 (!) 109   Resp: 16 (!) 24 (!) 26   Temp: 98.4 F (36.9 C) 98.6 F (37 C) 98.8 F (37.1 C) 97.9 F (36.6 C)  TempSrc:    Oral  SpO2: 100% 94% 96%   Weight:  111.9 kg (246 lb 11.1 oz)    Height:        Intake/Output Summary (Last 24 hours) at 02/22/17 0912 Last data filed at 02/22/17 0600  Gross per 24 hour  Intake            607.4 ml  Output             1025 ml  Net           -417.6 ml   Filed Weights   02/20/17 0500 02/21/17 0437 02/22/17 0400  Weight: 114 kg (251 lb 5.2 oz) 113.8 kg (250 lb 14.1 oz) 111.9 kg (246 lb 11.1 oz)    Examination: General exam: 56 y.o. female in no distress Respiratory system: Non-labored breathing room air. Clear to auscultation bilaterally.  Cardiovascular system: Regular tachycardia, no murmur. + JVD. Diffuse 2+ pitting edema in extremities and 1+ abd wall.  Gastrointestinal system: Abdomen non-tender, distended but soft, with normoactive bowel sounds. No organomegaly or masses felt. Central nervous system: Alert and oriented. No focal neurological deficits. Extremities: Warm, no deformities Skin: Lower back dressing c/d/i.  Psychiatry: Judgement and insight appear fair. Mood & affect appropriate.   Data Reviewed: I have personally reviewed following labs and imaging studies  CBC:  Recent Labs Lab 02/16/17 1421  02/18/17 0432 02/18/17 2011 02/19/17 0424 02/20/17 0517 02/21/17 0310  WBC 19.1*  < > 21.2* 24.4* 21.5* 19.3* 20.6*  NEUTROABS 16.6*  --  18.2* 20.5* 18.3*  --   --   HGB 3.7*  < >  7.7* 8.5* 8.4* 8.3* 8.3*  HCT 14.2*  < > 24.5* 27.3* 26.9* 27.1* 27.3*  MCV 60.9*  < > 74.2* 74.2* 73.9* 74.0* 74.4*  PLT 288  < > 173 208 206 189 214  < > = values in this interval not displayed. Basic Metabolic Panel:  Recent Labs Lab 02/17/17 0327 02/17/17 1529 02/18/17 0432 02/19/17 0424 02/20/17 0517 02/21/17 0310  NA 142 140 141 140 142 142  K 4.2 4.0 4.0 3.8 3.6 3.4*  CL 113* 114* 114* 113* 113* 115*  CO2 17* 18* 18* 18* 18* 18*  GLUCOSE 141* 161* 148* 140* 141* 140*  BUN 59* 62* 65* 62* 59* 60*  CREATININE 1.96* 2.05* 1.99* 1.97* 1.89* 1.86*  CALCIUM 9.0 8.7* 8.6* 8.6* 8.7* 8.5*  MG 2.1  --   --  2.3  --   --   PHOS 4.4  --   --   --   --   --    GFR: Estimated Creatinine Clearance: 43.6 mL/min (A) (by C-G formula based on SCr of 1.86 mg/dL (H)). Liver Function Tests:  Recent Labs Lab 02/16/17 1421 02/18/17 1415  AST 30 19  ALT  22 18  ALKPHOS 105 95  BILITOT 2.9* 2.3*  PROT 6.2* 6.0*  ALBUMIN 2.8* 2.7*   No results for input(s): LIPASE, AMYLASE in the last 168 hours. No results for input(s): AMMONIA in the last 168 hours. Coagulation Profile:  Recent Labs Lab 02/16/17 1421 02/18/17 1415  INR 1.40 1.39   Cardiac Enzymes:  Recent Labs Lab 02/17/17 0327 02/17/17 1103 02/17/17 1529 02/17/17 2136  TROPONINI <0.03 <0.03 <0.03 <0.03   BNP (last 3 results) No results for input(s): PROBNP in the last 8760 hours. HbA1C: No results for input(s): HGBA1C in the last 72 hours. CBG:  Recent Labs Lab 02/21/17 1625 02/21/17 1935 02/22/17 0020 02/22/17 0352 02/22/17 0825  GLUCAP 128* 137* 138* 157* 144*   Lipid Profile: No results for input(s): CHOL, HDL, LDLCALC, TRIG, CHOLHDL, LDLDIRECT in the last 72 hours. Thyroid Function Tests: No results for input(s): TSH, T4TOTAL, FREET4, T3FREE, THYROIDAB in the last 72 hours. Anemia Panel: No results for input(s): VITAMINB12, FOLATE, FERRITIN, TIBC, IRON, RETICCTPCT in the last 72 hours. Urine  analysis:    Component Value Date/Time   COLORURINE YELLOW 02/16/2017 1845   APPEARANCEUR CLEAR 02/16/2017 1845   LABSPEC 1.013 02/16/2017 1845   PHURINE 5.0 02/16/2017 1845   GLUCOSEU NEGATIVE 02/16/2017 1845   HGBUR NEGATIVE 02/16/2017 1845   BILIRUBINUR NEGATIVE 02/16/2017 1845   KETONESUR NEGATIVE 02/16/2017 1845   PROTEINUR NEGATIVE 02/16/2017 1845   NITRITE NEGATIVE 02/16/2017 1845   LEUKOCYTESUR NEGATIVE 02/16/2017 1845   Recent Results (from the past 240 hour(s))  Culture, blood (routine x 2)     Status: Abnormal   Collection Time: 02/16/17  2:10 PM  Result Value Ref Range Status   Specimen Description BLOOD RIGHT ANTECUBITAL  Final   Special Requests   Final    BOTTLES DRAWN AEROBIC AND ANAEROBIC Blood Culture adequate volume   Culture  Setup Time   Final    GRAM POSITIVE COCCI IN CLUSTERS IN BOTH AEROBIC AND ANAEROBIC BOTTLES CRITICAL RESULT CALLED TO, READ BACK BY AND VERIFIED WITH: L. Un[place]i, [place] 02/17/2017 T. TYSOR    Culture (A)  Final    STAPHYLOCOCCUS SPECIES (COAGULASE NEGATIVE) THE SIGNIFICANCE OF ISOLATING THIS ORGANISM FROM A SINGLE SET OF BLOOD CULTURES WHEN MULTIPLE SETS ARE DRAWN IS UNCERTAIN. PLEASE NOTIFY THE MICROBIOLOGY DEPARTMENT WITHIN ONE WEEK IF SPECIATION AND SENSITIVITIES ARE REQUIRED. Performed at [facility], [place] [address] [place], [place] [address]    Report Status 02/19/2017 FINAL  Final  Culture, blood (routine x 2)     Status: None   Collection Time: 02/16/17  2:21 PM  Result Value Ref Range Status   Specimen Description BLOOD LEFT ANTECUBITAL  Final   Special Requests   Final    BOTTLES DRAWN AEROBIC AND ANAEROBIC Blood Culture results may not be optimal due to an inadequate volume of blood received in culture bottles   Culture   Final    NO GROWTH 5 DAYS Performed at [facility], [place] [address] [place], [place] [address]    Report Status 02/21/2017 FINAL  Final  Blood Culture ID Panel (Reflexed)      Status: Abnormal   Collection Time: 02/16/17  2:21 PM  Result Value Ref Range Status   Enterococcus species NOT DETECTED NOT DETECTED Final   Listeria monocytogenes NOT DETECTED NOT DETECTED Final   Staphylococcus species DETECTED (A) NOT DETECTED Final    Comment: Methicillin (oxacillin) resistant coagulase negative staphylococcus. Possible blood culture contaminant (unless isolated  from more than one blood culture draw or clinical case suggests pathogenicity). No antibiotic treatment is indicated for blood  culture contaminants. CRITICAL RESULT CALLED TO, READ BACK BY AND VERIFIED WITH: L. POINDEXTER, [place] 02/17/2017 T. TYSOR    Staphylococcus aureus NOT DETECTED NOT DETECTED Final   Methicillin resistance DETECTED (A) NOT DETECTED Final    Comment: CRITICAL RESULT CALLED TO, READ BACK BY AND VERIFIED WITH: L. Wynona Canes, [place] 02/17/2017 T. TYSOR    Streptococcus species NOT DETECTED NOT DETECTED Final   Streptococcus agalactiae NOT DETECTED NOT DETECTED Final   Streptococcus pneumoniae NOT DETECTED NOT DETECTED Final   Streptococcus pyogenes NOT DETECTED NOT DETECTED Final   Acinetobacter baumannii NOT DETECTED NOT DETECTED Final   Enterobacteriaceae species NOT DETECTED NOT DETECTED Final   Enterobacter cloacae complex NOT DETECTED NOT DETECTED Final   Escherichia coli NOT DETECTED NOT DETECTED Final   Klebsiella oxytoca NOT DETECTED NOT DETECTED Final   Klebsiella pneumoniae NOT DETECTED NOT DETECTED Final   Proteus species NOT DETECTED NOT DETECTED Final   Serratia marcescens NOT DETECTED NOT DETECTED Final   Haemophilus influenzae NOT DETECTED NOT DETECTED Final   Neisseria meningitidis NOT DETECTED NOT DETECTED Final   Pseudomonas aeruginosa NOT DETECTED NOT DETECTED Final   Candida albicans NOT DETECTED NOT DETECTED Final   Candida glabrata NOT DETECTED NOT DETECTED Final   Candida krusei NOT DETECTED NOT DETECTED Final   Candida parapsilosis NOT DETECTED NOT  DETECTED Final   Candida tropicalis NOT DETECTED NOT DETECTED Final  MRSA PCR Screening     Status: None   Collection Time: 02/16/17  9:33 PM  Result Value Ref Range Status   MRSA by PCR NEGATIVE NEGATIVE Final    Comment:        The GeneXpert MRSA Assay (FDA approved for NASAL specimens only), is one component of a comprehensive MRSA colonization surveillance program. It is not intended to diagnose MRSA infection nor to guide or monitor treatment for MRSA infections.   Acid Fast Smear (AFB)     Status: None   Collection Time: 02/18/17 12:00 PM  Result Value Ref Range Status   AFB Specimen Processing Concentration  Final   Acid Fast Smear Negative  Final    Comment: (NOTE) Performed At: [facility] [address] [place], [place] [id number] Lindon Romp MD [id number]    Source (AFB) PERITONEAL  Final  Culture, body fluid-bottle     Status: None (Preliminary result)   Collection Time: 02/18/17 12:00 PM  Result Value Ref Range Status   Specimen Description PERITONEAL  Final   Special Requests NONE  Final   Culture   Final    NO GROWTH 3 DAYS Performed at [facility], [address], [place], [place] [address]    Report Status PENDING  Incomplete  Gram stain     Status: None   Collection Time: 02/18/17 12:00 PM  Result Value Ref Range Status   Specimen Description PERITONEAL  Final   Special Requests NONE  Final   Gram Stain   Final    RARE WBC PRESENT, PREDOMINANTLY MONONUCLEAR NO ORGANISMS SEEN Performed at [facility], [address], [place], [place] [address]    Report Status 02/18/2017 FINAL  Final      Radiology Studies: Dg Chest 1 View  Result Date: 02/21/2017 CLINICAL DATA:  Productive cough EXAM: CHEST 1 VIEW COMPARISON:  02/18/2017 FINDINGS: Cardiac shadow remains enlarged. Right basilar atelectasis is noted stable from  the prior exam. Left central venous line is seen in the proximal superior vena cava. No pneumothorax is  noted. No bony abnormality is noted. IMPRESSION: Mild right basilar atelectasis.  No acute abnormality seen. Electronically Signed   By: Inez Catalina M.D.   On: 02/21/2017 10:15    Scheduled Meds: . Chlorhexidine Gluconate Cloth  6 each Topical Daily  . feeding supplement (ENSURE ENLIVE)  237 mL Oral TID BM  . insulin aspart  0-15 Units Subcutaneous Q4H  . levothyroxine  100 mcg Oral QAC breakfast  . mouth rinse  15 mL Mouth Rinse BID  . multivitamin with minerals  1 tablet Oral Daily  . pantoprazole  40 mg Oral BID  . sodium chloride flush  10-40 mL Intracatheter Q12H  . thiamine  100 mg Intravenous Daily   Continuous Infusions: . sodium chloride    . amiodarone 30 mg/hr (02/21/17 2030)  . cefTRIAXone (ROCEPHIN)  IV Stopped (02/21/17 1000)     LOS: 6 days   Time spent: 25 minutes.  Vance Gather, MD [facility] Pager [phone]  If 7PM-7AM, please contact night-coverage [email or web address] Password [email or web address] 02/22/2017, 9:12 AM

## 2017-02-22 NOTE — Progress Notes (Signed)
Progress Note  Patient Name: Samantha Richards Date of Encounter: 02/22/2017  Primary Cardiologist: None  Subjective   Doing better this morning, continues to diurese well she says urinating a lot and passing gas.  She is in sinus tachycardia this morning. No longer on pressures.  Reports waking up from sleep coughing and gasping for air.  New severe productive cough this AM   Inpatient Medications    Scheduled Meds: . Chlorhexidine Gluconate Cloth  6 each Topical Daily  . feeding supplement (ENSURE ENLIVE)  237 mL Oral TID BM  . insulin aspart  0-15 Units Subcutaneous Q4H  . levothyroxine  100 mcg Oral QAC breakfast  . mouth rinse  15 mL Mouth Rinse BID  . multivitamin with minerals  1 tablet Oral Daily  . pantoprazole  40 mg Oral BID  . sodium chloride flush  10-40 mL Intracatheter Q12H  . thiamine  100 mg Intravenous Daily   Continuous Infusions: . sodium chloride    . amiodarone 30 mg/hr (02/21/17 2030)  . cefTRIAXone (ROCEPHIN)  IV Stopped (02/21/17 1000)   PRN Meds: sodium chloride, guaiFENesin-dextromethorphan, iopamidol, lip balm, ondansetron (ZOFRAN) IV, promethazine, sodium chloride flush   Vital Signs    Vitals:   02/22/17 0200 02/22/17 0400 02/22/17 0600 02/22/17 0800  BP: 100/60 (!) 140/120 92/70   Pulse: 95 (!) 31 (!) 109   Resp: 16 (!) 24 (!) 26   Temp: 98.4 F (36.9 C) 98.6 F (37 C) 98.8 F (37.1 C) 97.9 F (36.6 C)  TempSrc:    Oral  SpO2: 100% 94% 96%   Weight:  246 lb 11.1 oz (111.9 kg)    Height:        Intake/Output Summary (Last 24 hours) at 02/22/17 0912 Last data filed at 02/22/17 0600  Gross per 24 hour  Intake            607.4 ml  Output             1025 ml  Net           -417.6 ml   Filed Weights   02/20/17 0500 02/21/17 0437 02/22/17 0400  Weight: 251 lb 5.2 oz (114 kg) 250 lb 14.1 oz (113.8 kg) 246 lb 11.1 oz (111.9 kg)    Telemetry    Sinus tachycardia, rates 100-110 - Personally Reviewed   Physical Exam   GEN: Well  nourished, well developed HEENT: normal  Neck: no JVD, carotid bruits, or masses Cardiac: Regular rhythm and tachycardia. Possible diastolic murmur, rubs, or gallops,. Intact distal pulses bilaterally.  Improved edema Respiratory: clear to auscultation bilaterally, + increased effort of breathing and productive cough. GI: soft, nontender, nondistended, + BS MS: no deformity or atrophy  Skin: warm and dry, no rash Neuro: Alert and Oriented x 3, Strength and sensation are intact Psych:   Full affect  Labs    Chemistry Recent Labs Lab 02/16/17 1421  02/18/17 1415 02/19/17 0424 02/20/17 0517 02/21/17 0310  NA 140  < >  --  140 142 142  K 4.2  < >  --  3.8 3.6 3.4*  CL 112*  < >  --  113* 113* 115*  CO2 17*  < >  --  18* 18* 18*  GLUCOSE 119*  < >  --  140* 141* 140*  BUN 52*  < >  --  62* 59* 60*  CREATININE 1.96*  < >  --  1.97* 1.89* 1.86*  CALCIUM 9.1  < >  --  8.6* 8.7* 8.5*  PROT 6.2*  --  6.0*  --   --   --   ALBUMIN 2.8*  --  2.7*  --   --   --   AST 30  --  19  --   --   --   ALT 22  --  18  --   --   --   ALKPHOS 105  --  95  --   --   --   BILITOT 2.9*  --  2.3*  --   --   --   GFRNONAA 28*  < >  --  28* 29* 30*  GFRAA 32*  < >  --  32* 34* 35*  ANIONGAP 11  < >  --  9 11 9   < > = values in this interval not displayed.   Hematology  Recent Labs Lab 02/19/17 0424 02/20/17 0517 02/21/17 0310  WBC 21.5* 19.3* 20.6*  RBC 3.64* 3.66* 3.67*  HGB 8.4* 8.3* 8.3*  HCT 26.9* 27.1* 27.3*  MCV 73.9* 74.0* 74.4*  MCH 23.1* 22.7* 22.6*  MCHC 31.2 30.6 30.4  RDW 28.8* 29.3* 29.8*  PLT 206 189 214    Cardiac Enzymes  Recent Labs Lab 02/17/17 0327 02/17/17 1103 02/17/17 1529 02/17/17 2136  TROPONINI <0.03 <0.03 <0.03 <0.03   No results for input(s): TROPIPOC in the last 168 hours.   BNP  Recent Labs Lab 02/16/17 1516  BNP 923.6*     DDimer No results for input(s): DDIMER in the last 168 hours.   Radiology    Dg Chest 1 View  Result Date:  02/21/2017 CLINICAL DATA:  Productive cough EXAM: CHEST 1 VIEW COMPARISON:  02/18/2017 FINDINGS: Cardiac shadow remains enlarged. Right basilar atelectasis is noted stable from the prior exam. Left central venous line is seen in the proximal superior vena cava. No pneumothorax is noted. No bony abnormality is noted. IMPRESSION: Mild right basilar atelectasis.  No acute abnormality seen. Electronically Signed   By: Inez Catalina M.D.   On: 02/21/2017 10:15    Cardiac Studies   Transthoracic Echo (02/17/2017) - Left ventricle: Septal flattening consistant with elevated RV pressures. Systolic function was moderately to severely reduced. The estimated ejection fraction was in the range of 30% to 35%. Diffuse hypokinesis. - Mitral valve: There was mild regurgitation. - Left atrium: The atrium was mildly dilated. - Right ventricle: The cavity size was moderately dilated. - Right atrium: The atrium was moderately dilated. - Atrial septum: No defect or patent foramen ovale was identified. - Tricuspid valve: There was moderate-severe regurgitation. - Pericardium, extracardiac: A trivial pericardial effusion was identified. - Impressions: Suspect PA pressure underestimated by TR velocity due to RV dysfunction. There is moderate RV enlargement with severe hypokinesis and signs of significant cor pulmonale.  Impressions:  - Suspect PA pressure underestimated by TR velocity due to RV dysfunction. There is moderate RV enlargement with severe hypokinesis and signs of significant cor pulmonale  Patient Profile     Samantha Richards not sought medical care in years and does not have a known past medical history aside from obesity. She denies ETOH, drug use and is a never smoker.  She is being seen for the evaluation of atrial fibrillation with RVR and RV overload/cor pulmonaleat the request of Dr. Lake Bells, Critical Care.   Assessment & Plan    1.   CHF :   May be rate related  cardiomyopathy. Has not had any angina . Will be adding low  dose beta blocker as tolerated.   will  try metoprolol 12.5 bid   I think she would benefit from having a Swan Ganz catheter placed  I agree that a right and left heart cath may be indicated but at this point, I think she needs to be stronger .  In addition, WBC is very elevated. - 23K today  Hb is only 8.4.   2. Cough: Congested and crackly cough this AM with sputum. Chest xray ordered.  3.OSA:  Will need sleep study at discharge, reports waking up and gasping for air.  4 Cor pulmonale: pt intolerant of CPAP, but willing to try again.  will need outpatient sleep study. Continue to diurese.  5. Anemia: s/p transfusion:  EGD today shows gastritis and esophagitis.  Starting protonix   6. Atrial flutter w/ RVR: Currently back in sinus rhythm on amiodarone for rate control. Rates 70-115. No longer on pressures.  Adding metoprolol 12. 5 bid Titrate up as tolerated.

## 2017-02-22 NOTE — Progress Notes (Signed)
PHARMACIST - PHYSICIAN COMMUNICATION  DR:   Bonner Puna  CONCERNING: IV to Oral Route Change Policy  RECOMMENDATION: This patient is receiving thiamine by the intravenous route.  Based on criteria approved by the Pharmacy and Therapeutics Committee, the intravenous medication(s) is/are being converted to the equivalent oral dose form(s).   DESCRIPTION: These criteria include:  The patient is eating (either orally or via tube) and/or has been taking other orally administered medications for a least 24 hours  The patient has no evidence of active gastrointestinal bleeding or impaired GI absorption (gastrectomy, short bowel, patient on TNA or NPO).  If you have questions about this conversion, please contact the Pharmacy Department  []   [phone] )  [facility] []   [phone] )  [facility] []   [phone] )  [facility] []   [phone] )  [facility] [x]   [phone] )  [place], [place], [facility] 02/22/2017 10:42 AM

## 2017-02-23 DIAGNOSIS — E039 Hypothyroidism, unspecified: Secondary | ICD-10-CM

## 2017-02-23 LAB — GLUCOSE, CAPILLARY
GLUCOSE-CAPILLARY: 136 mg/dL — AB (ref 65–99)
GLUCOSE-CAPILLARY: 143 mg/dL — AB (ref 65–99)
GLUCOSE-CAPILLARY: 151 mg/dL — AB (ref 65–99)
GLUCOSE-CAPILLARY: 152 mg/dL — AB (ref 65–99)
GLUCOSE-CAPILLARY: 167 mg/dL — AB (ref 65–99)
Glucose-Capillary: 119 mg/dL — ABNORMAL HIGH (ref 65–99)
Glucose-Capillary: 131 mg/dL — ABNORMAL HIGH (ref 65–99)

## 2017-02-23 LAB — CBC
HEMATOCRIT: 27.5 % — AB (ref 36.0–46.0)
HEMOGLOBIN: 8.4 g/dL — AB (ref 12.0–15.0)
MCH: 22.7 pg — ABNORMAL LOW (ref 26.0–34.0)
MCHC: 30.5 g/dL (ref 30.0–36.0)
MCV: 74.3 fL — ABNORMAL LOW (ref 78.0–100.0)
Platelets: 234 10*3/uL (ref 150–400)
RBC: 3.7 MIL/uL — AB (ref 3.87–5.11)
RDW: 30.4 % — ABNORMAL HIGH (ref 11.5–15.5)
WBC: 23.3 10*3/uL — AB (ref 4.0–10.5)

## 2017-02-23 LAB — BASIC METABOLIC PANEL
ANION GAP: 10 (ref 5–15)
BUN: 57 mg/dL — ABNORMAL HIGH (ref 6–20)
CHLORIDE: 112 mmol/L — AB (ref 101–111)
CO2: 19 mmol/L — AB (ref 22–32)
Calcium: 8.7 mg/dL — ABNORMAL LOW (ref 8.9–10.3)
Creatinine, Ser: 1.76 mg/dL — ABNORMAL HIGH (ref 0.44–1.00)
GFR calc non Af Amer: 32 mL/min — ABNORMAL LOW (ref >60)
GFR, EST AFRICAN AMERICAN: 37 mL/min — AB (ref >60)
Glucose, Bld: 140 mg/dL — ABNORMAL HIGH (ref 65–99)
POTASSIUM: 4 mmol/L (ref 3.5–5.1)
SODIUM: 141 mmol/L (ref 135–145)

## 2017-02-23 LAB — HEMOGLOBIN A1C
HEMOGLOBIN A1C: 5.8 % — AB (ref 4.8–5.6)
Mean Plasma Glucose: 120 mg/dL

## 2017-02-23 LAB — CULTURE, BODY FLUID W GRAM STAIN -BOTTLE: Culture: NO GROWTH

## 2017-02-23 LAB — CULTURE, BODY FLUID-BOTTLE

## 2017-02-23 LAB — PROCALCITONIN: PROCALCITONIN: 0.35 ng/mL

## 2017-02-23 MED ORDER — METOPROLOL TARTRATE 12.5 MG HALF TABLET
12.5000 mg | ORAL_TABLET | Freq: Two times a day (BID) | ORAL | Status: DC
  Administered 2017-02-23 – 2017-02-26 (×5): 12.5 mg via ORAL
  Filled 2017-02-23 (×7): qty 1

## 2017-02-23 NOTE — Progress Notes (Signed)
PROGRESS NOTE  Samantha Richards  OZD:[id number] DOB: 1961/01/10 DOA: 02/16/2017 PCP: Patient, No Pcp Per   Brief Narrative: Samantha Richards is a 56 y.o. female who presented on 6/24 after a fall at home found to be in hemorrhagic/hypovolemic and cardiogenic shock, severely anemic in the setting of weeks of slow GI bleed and Hgb 3.7. Transfusions and BiPAP provided in addition to vancomycin/ceftriaxone for cellulitis of the back. EGD 6/28 showed gastritis and esophagitis, and pt declined colonoscopy. Levophed was weaned 6/28 and the patient was transferred to hospitalist service 6/29. AFib with RVR has been managed by cardiology, and has converted to sinus rhythm. EF 30% with evidence of RV overload/cor pulmonale. She appears severely malnourished with ascites, paracentesis 6/26 of 2.5L fluid with negative cytology and culture.   Assessment & Plan: Principal Problem:   Acute blood loss anemia Active Problems:   GI bleed   Pressure injury of skin   Ascites   Generalized weakness   Symptomatic anemia   Acute on chronic combined systolic and diastolic CHF (congestive heart failure) (HCC)   Atypical atrial flutter (HCC)   Encounter for central line placement   Cardiogenic shock (HCC)   Severe protein-calorie malnutrition ([place])   Hypothyroidism  Cardiogenic shock with acute systolic CHF and RV overload/cor pulmonale: Improving, off pressors since 6/28. Cortisol 25.8. Echo showed RV overload, diffusely hypokinetic with LVEF 30-35%. Net up 5L. - No current orders for diuresis or IVF. Making urine, keeping small net negative balance for the past few days. - Cardiology considering LHC/RHC - Monitor co-ox, still low due to anemia and impaired CO. 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 pylori - Pt now amenable to colonoscopy. Will call GI to schedule once more hemodynamically  stable or if rebleeding. - Continue PPI - Work-up for non-hemorrhagic cause of anemia included negative haptoglobin and DAT, indirect bili wnl. Will order reticulocyte/anemia panel in AM, consider hematology consult  AFib with RVR:  - Per cardiology. Continuing amiodarone gtt, converted to consistent sinus tachycardia - Not a candidate for anticoagulation with profound anemia/GI bleeding from gastritis.  Severe protein-calorie malnutrition with kwashiorkor: Prealbumin undetectable, transudative ascites tapped 6/26. Likely underlying mechanism of most problems. - Nutrition consulted, protein supplementation - Continue supplements - Has been hyperglycemic: Check HbA1c, pending  Muscular deconditioning:  - PT consulted, anticipate only safe discharge currently would be to SNF, but will continue to evaluate.  Renal impairment: Uncertain baseline, but SCr has improved modestly from admission 1.96 to appearing stable ~1.8, if this remains baseline, would be CKD stage III. Mild acidosis suggestive of CKD.  - Monitor BMP, UOP  Suspected OSA:  - CPAP while in hospital - Outpatient sleep study - Monitor pulse oximetry and nocturnal oxygen as needed  Cellulitis: Reactive leukocytosis vs. due to cellulitis. Remains afebrile. - Monitor blood cultures (suspected S. epidermidis contaminant initially) - Continue ceftriaxone for nonpurulent cellulitis, vancomycin stopped 6/28. - Agree with checking HIV   Hypothyroidism: TSH 11.7. Uncertain how much is due to dietary iodine deficiency.  - Started synthroid - Will need recheck in 4 weeks.   DVT prophylaxis: SCDs Code Status: Full Family Communication: None at bedside Disposition Plan: Still requiring amiodarone infusion and soft BP, remain in SDU. SNF when stable for discharge.  Consultants:   PCCM primary until 6/29  [facility], signed off 6/29  Cardiology  Procedures:  - ECHO 6/24 >>  LVEF 30-35%,  RV mod dilated, severe hypokinesis, LA  dilated, mild MR, RA mod dilated - Paracentesis 6/26 > 2.5 L fluid removed, cytology negative - EGD 6/28 >> intense distal esophagitis with focal hemorrhagic hemorrhage, small hiatal hernia  Antimicrobials:  Vancomycin 6/24 - 6/28  Ceftriaxone 6/24 >>    Subjective: Slept in recliner due to not wanting to get back in bed, denies orthopnea. Has Intermittent dyspnea related to abdominal distention. Continues to cough without sputum or chest pain or palpitations. Eating a little, trying to eat more.   Objective: Vitals:   02/23/17 0400 02/23/17 0500 02/23/17 0600 02/23/17 0800  BP:      Pulse:  (!) 104 99   Resp: 17 (!) 23 19   Temp:    97.8 F (36.6 C)  TempSrc:    Oral  SpO2:  98% 100%   Weight:      Height:        Intake/Output Summary (Last 24 hours) at 02/23/17 0831 Last data filed at 02/23/17 0500  Gross per 24 hour  Intake            350.7 ml  Output              700 ml  Net           -349.3 ml   Filed Weights   02/20/17 0500 02/21/17 0437 02/22/17 0400  Weight: 114 kg (251 lb 5.2 oz) 113.8 kg (250 lb 14.1 oz) 111.9 kg (246 lb 11.1 oz)    Examination: General exam: 56 y.o. female in no distress Respiratory system: Non-labored breathing room air. Clear to auscultation bilaterally.  Cardiovascular system: Regular tachycardia, no murmur. + JVD. Diffuse 2+ pitting edema in extremities and 1+ abd wall.  Gastrointestinal system: Abdomen non-tender, distended but soft, with normoactive bowel sounds. No organomegaly or masses felt. Central nervous system: Alert and oriented. No focal neurological deficits. Extremities: Warm, no deformities Skin: Lower back dressing c/d/i. Left upper abdominal puncture site c/d/i Psychiatry: Judgement and insight appear fair. Mood & affect appropriate.   Data Reviewed: I have personally reviewed following labs and imaging studies  CBC:  Recent Labs Lab 02/16/17 1421  02/18/17 0432 02/18/17 2011 02/19/17 0424 02/20/17 0517  02/21/17 0310 02/22/17 0500 02/23/17 0308  WBC 19.1*  < > 21.2* 24.4* 21.5* 19.3* 20.6* 25.1* 23.3*  NEUTROABS 16.6*  --  18.2* 20.5* 18.3*  --   --   --   --   HGB 3.7*  < > 7.7* 8.5* 8.4* 8.3* 8.3* 8.5* 8.4*  HCT 14.2*  < > 24.5* 27.3* 26.9* 27.1* 27.3* 28.0* 27.5*  MCV 60.9*  < > 74.2* 74.2* 73.9* 74.0* 74.4* 75.3* 74.3*  PLT 288  < > 173 208 206 189 214 239 234  < > = values in this interval not displayed. Basic Metabolic Panel:  Recent Labs Lab 02/17/17 0327  02/19/17 0424 02/20/17 0517 02/21/17 0310 02/22/17 0500 02/23/17 0308  NA 142  < > 140 142 142 140 141  K 4.2  < > 3.8 3.6 3.4* 4.1 4.0  CL 113*  < > 113* 113* 115* 113* 112*  CO2 17*  < > 18* 18* 18* 18* 19*  GLUCOSE 141*  < > 140* 141* 140* 160* 140*  BUN 59*  < > 62* 59* 60* 59* 57*  CREATININE 1.96*  < > 1.97* 1.89* 1.86* 1.84* 1.76*  CALCIUM 9.0  < > 8.6* 8.7* 8.5* 8.7* 8.7*  MG 2.1  --  2.3  --   --   --   --  PHOS 4.4  --   --   --   --   --   --   < > = values in this interval not displayed. GFR: Estimated Creatinine Clearance: 46.1 mL/min (A) (by C-G formula based on SCr of 1.76 mg/dL (H)). Liver Function Tests:  Recent Labs Lab 02/16/17 1421 02/18/17 1415  AST 30 19  ALT 22 18  ALKPHOS 105 95  BILITOT 2.9* 2.3*  PROT 6.2* 6.0*  ALBUMIN 2.8* 2.7*   No results for input(s): LIPASE, AMYLASE in the last 168 hours. No results for input(s): AMMONIA in the last 168 hours. Coagulation Profile:  Recent Labs Lab 02/16/17 1421 02/18/17 1415  INR 1.40 1.39   Cardiac Enzymes:  Recent Labs Lab 02/17/17 0327 02/17/17 1103 02/17/17 1529 02/17/17 2136  TROPONINI <0.03 <0.03 <0.03 <0.03   BNP (last 3 results) No results for input(s): PROBNP in the last 8760 hours. HbA1C: No results for input(s): HGBA1C in the last 72 hours. CBG:  Recent Labs Lab 02/22/17 1633 02/22/17 2002 02/23/17 0010 02/23/17 0329 02/23/17 0802  GLUCAP 110* 114* 167* 143* 119*   Lipid Profile: No results for  input(s): CHOL, HDL, LDLCALC, TRIG, CHOLHDL, LDLDIRECT in the last 72 hours. Thyroid Function Tests: No results for input(s): TSH, T4TOTAL, FREET4, T3FREE, THYROIDAB in the last 72 hours. Anemia Panel:  Recent Labs  02/22/17 0500  VITAMINB12 1,202*  FOLATE 10.9  FERRITIN 15  TIBC 438  IRON 16*  RETICCTPCT 2.5   Urine analysis:    Component Value Date/Time   COLORURINE YELLOW 02/16/2017 1845   APPEARANCEUR CLEAR 02/16/2017 1845   LABSPEC 1.013 02/16/2017 1845   PHURINE 5.0 02/16/2017 1845   GLUCOSEU NEGATIVE 02/16/2017 1845   HGBUR NEGATIVE 02/16/2017 1845   BILIRUBINUR NEGATIVE 02/16/2017 1845   KETONESUR NEGATIVE 02/16/2017 1845   PROTEINUR NEGATIVE 02/16/2017 1845   NITRITE NEGATIVE 02/16/2017 1845   LEUKOCYTESUR NEGATIVE 02/16/2017 1845   Recent Results (from the past 240 hour(s))  Culture, blood (routine x 2)     Status: Abnormal   Collection Time: 02/16/17  2:10 PM  Result Value Ref Range Status   Specimen Description BLOOD RIGHT ANTECUBITAL  Final   Special Requests   Final    BOTTLES DRAWN AEROBIC AND ANAEROBIC Blood Culture adequate volume   Culture  Setup Time   Final    GRAM POSITIVE COCCI IN CLUSTERS IN BOTH AEROBIC AND ANAEROBIC BOTTLES CRITICAL RESULT CALLED TO, READ BACK BY AND VERIFIED WITH: L. [place], [place] 02/17/2017 T. TYSOR    Culture (A)  Final    STAPHYLOCOCCUS SPECIES (COAGULASE NEGATIVE) THE SIGNIFICANCE OF ISOLATING THIS ORGANISM FROM A SINGLE SET OF BLOOD CULTURES WHEN MULTIPLE SETS ARE DRAWN IS UNCERTAIN. PLEASE NOTIFY THE MICROBIOLOGY DEPARTMENT WITHIN ONE WEEK IF SPECIATION AND SENSITIVITIES ARE REQUIRED. Performed at [facility], [place] [address], [place], [place] [address]    Report Status 02/19/2017 FINAL  Final  Culture, blood (routine x 2)     Status: None   Collection Time: 02/16/17  2:21 PM  Result Value Ref Range Status   Specimen Description BLOOD LEFT ANTECUBITAL  Final   Special Requests   Final    BOTTLES DRAWN  AEROBIC AND ANAEROBIC Blood Culture results may not be optimal due to an inadequate volume of blood received in culture bottles   Culture   Final    NO GROWTH 5 DAYS Performed at [facility], [place] [address], [place], [place] [address]  Report Status 02/21/2017 FINAL  Final  Blood Culture ID Panel (Reflexed)     Status: Abnormal   Collection Time: 02/16/17  2:21 PM  Result Value Ref Range Status   Enterococcus species NOT DETECTED NOT DETECTED Final   Listeria monocytogenes NOT DETECTED NOT DETECTED Final   Staphylococcus species DETECTED (A) NOT DETECTED Final    Comment: Methicillin (oxacillin) resistant coagulase negative staphylococcus. Possible blood culture contaminant (unless isolated from more than one blood culture draw or clinical case suggests pathogenicity). No antibiotic treatment is indicated for blood  culture contaminants. CRITICAL RESULT CALLED TO, READ BACK BY AND VERIFIED WITH: L. POINDEXTER, [place] 02/17/2017 T. TYSOR    Staphylococcus aureus NOT DETECTED NOT DETECTED Final   Methicillin resistance DETECTED (A) NOT DETECTED Final    Comment: CRITICAL RESULT CALLED TO, READ BACK BY AND VERIFIED WITH: L. Wynona [place], [place] 02/17/2017 T. TYSOR    Streptococcus species NOT DETECTED NOT DETECTED Final   Streptococcus agalactiae NOT DETECTED NOT DETECTED Final   Streptococcus pneumoniae NOT DETECTED NOT DETECTED Final   Streptococcus pyogenes NOT DETECTED NOT DETECTED Final   Acinetobacter baumannii NOT DETECTED NOT DETECTED Final   Enterobacteriaceae species NOT DETECTED NOT DETECTED Final   Enterobacter cloacae complex NOT DETECTED NOT DETECTED Final   Escherichia coli NOT DETECTED NOT DETECTED Final   Klebsiella oxytoca NOT DETECTED NOT DETECTED Final   Klebsiella pneumoniae NOT DETECTED NOT DETECTED Final   Proteus species NOT DETECTED NOT DETECTED Final   Serratia marcescens NOT DETECTED NOT DETECTED Final   Haemophilus influenzae NOT DETECTED NOT  DETECTED Final   Neisseria meningitidis NOT DETECTED NOT DETECTED Final   Pseudomonas aeruginosa NOT DETECTED NOT DETECTED Final   Candida albicans NOT DETECTED NOT DETECTED Final   Candida glabrata NOT DETECTED NOT DETECTED Final   Candida krusei NOT DETECTED NOT DETECTED Final   Candida parapsilosis NOT DETECTED NOT DETECTED Final   Candida tropicalis NOT DETECTED NOT DETECTED Final  MRSA PCR Screening     Status: None   Collection Time: 02/16/17  9:33 PM  Result Value Ref Range Status   MRSA by PCR NEGATIVE NEGATIVE Final    Comment:        The GeneXpert MRSA Assay (FDA approved for NASAL specimens only), is one component of a comprehensive MRSA colonization surveillance program. It is not intended to diagnose MRSA infection nor to guide or monitor treatment for MRSA infections.   Acid Fast Smear (AFB)     Status: None   Collection Time: 02/18/17 12:00 PM  Result Value Ref Range Status   AFB Specimen Processing Concentration  Final   Acid Fast Smear Negative  Final    Comment: (NOTE) Performed At: [facility] [address] [place], [place] [id number] Lindon Romp MD HB:[id number]    Source (AFB) PERITONEAL  Final  Culture, body fluid-bottle     Status: None (Preliminary result)   Collection Time: 02/18/17 12:00 PM  Result Value Ref Range Status   Specimen Description PERITONEAL  Final   Special Requests NONE  Final   Culture   Final    NO GROWTH 4 DAYS Performed at [facility], [address], [place], [place] [address]    Report Status PENDING  Incomplete  Gram stain     Status: None   Collection Time: 02/18/17 12:00 PM  Result Value Ref Range Status   Specimen Description PERITONEAL  Final   Special Requests NONE  Final   Gram Stain  Final    RARE WBC PRESENT, PREDOMINANTLY MONONUCLEAR NO ORGANISMS SEEN Performed at [facility], [place] [address], [place], [place] [address]    Report Status 02/18/2017 FINAL  Final      Radiology  Studies: Dg Chest 1 View  Result Date: 02/21/2017 CLINICAL DATA:  Productive cough EXAM: CHEST 1 VIEW COMPARISON:  02/18/2017 FINDINGS: Cardiac shadow remains enlarged. Right basilar atelectasis is noted stable from the prior exam. Left central venous line is seen in the proximal superior vena cava. No pneumothorax is noted. No bony abnormality is noted. IMPRESSION: Mild right basilar atelectasis.  No acute abnormality seen. Electronically Signed   By: Inez Catalina M.D.   On: 02/21/2017 10:15   Dg Chest Port 1 View  Result Date: 02/22/2017 CLINICAL DATA:  Shortness of breath today, cough EXAM: PORTABLE CHEST 1 VIEW COMPARISON:  Portable exam 1603 hours compared to 02/21/2017 FINDINGS: LEFT jugular line with tip projecting over SVC. Enlargement of cardiac silhouette. Mediastinal contours and pulmonary vascularity normal. Decreased lung volumes with RIGHT basilar atelectasis. Probable streaky atelectasis at retrocardiac LEFT lower lobe as well. No gross infiltrate, pleural effusion or pneumothorax. IMPRESSION: Probable bibasilar atelectasis. Electronically Signed   By: Lavonia Dana M.D.   On: 02/22/2017 16:17    Scheduled Meds: . Chlorhexidine Gluconate Cloth  6 each Topical Daily  . feeding supplement (ENSURE ENLIVE)  237 mL Oral TID BM  . insulin aspart  0-15 Units Subcutaneous Q4H  . levothyroxine  100 mcg Oral QAC breakfast  . mouth rinse  15 mL Mouth Rinse BID  . multivitamin with minerals  1 tablet Oral Daily  . pantoprazole  40 mg Oral BID  . sodium chloride flush  10-40 mL Intracatheter Q12H  . thiamine  100 mg Oral Daily   Continuous Infusions: . sodium chloride    . amiodarone 30 mg/hr (02/23/17 0500)  . cefTRIAXone (ROCEPHIN)  IV Stopped (02/22/17 1101)     LOS: 7 days   Time spent: 25 minutes.  Vance Gather, MD [facility] Pager [phone]  If 7PM-7AM, please contact night-coverage [email or web address] Password [email or web address] 02/23/2017, 8:31 AM

## 2017-02-23 NOTE — Progress Notes (Signed)
Unable to get patient's weight because patient refuses to get back in bed. Currently in the recliner. States she feels more comfortable in the recliner.

## 2017-02-24 DIAGNOSIS — D62 Acute posthemorrhagic anemia: Secondary | ICD-10-CM

## 2017-02-24 LAB — BASIC METABOLIC PANEL
Anion gap: 9 (ref 5–15)
BUN: 54 mg/dL — AB (ref 6–20)
CALCIUM: 8.7 mg/dL — AB (ref 8.9–10.3)
CHLORIDE: 109 mmol/L (ref 101–111)
CO2: 19 mmol/L — AB (ref 22–32)
CREATININE: 1.85 mg/dL — AB (ref 0.44–1.00)
GFR calc Af Amer: 35 mL/min — ABNORMAL LOW (ref >60)
GFR calc non Af Amer: 30 mL/min — ABNORMAL LOW (ref >60)
GLUCOSE: 98 mg/dL (ref 65–99)
Potassium: 4 mmol/L (ref 3.5–5.1)
Sodium: 137 mmol/L (ref 135–145)

## 2017-02-24 LAB — GLUCOSE, CAPILLARY
GLUCOSE-CAPILLARY: 115 mg/dL — AB (ref 65–99)
Glucose-Capillary: 104 mg/dL — ABNORMAL HIGH (ref 65–99)
Glucose-Capillary: 119 mg/dL — ABNORMAL HIGH (ref 65–99)
Glucose-Capillary: 121 mg/dL — ABNORMAL HIGH (ref 65–99)
Glucose-Capillary: 115 mg/dL — ABNORMAL HIGH (ref 65–99)
Glucose-Capillary: 100 mg/dL — ABNORMAL HIGH (ref 65–99)

## 2017-02-24 LAB — CBC
HEMATOCRIT: 27.2 % — AB (ref 36.0–46.0)
HEMOGLOBIN: 8.4 g/dL — AB (ref 12.0–15.0)
MCH: 23.1 pg — AB (ref 26.0–34.0)
MCHC: 30.9 g/dL (ref 30.0–36.0)
MCV: 74.9 fL — ABNORMAL LOW (ref 78.0–100.0)
Platelets: 278 10*3/uL (ref 150–400)
RBC: 3.63 MIL/uL — ABNORMAL LOW (ref 3.87–5.11)
RDW: 30.9 % — AB (ref 11.5–15.5)
WBC: 23.2 10*3/uL — ABNORMAL HIGH (ref 4.0–10.5)

## 2017-02-24 LAB — PROCALCITONIN: Procalcitonin: 0.35 ng/mL

## 2017-02-24 MED ORDER — FERROUS SULFATE 325 (65 FE) MG PO TABS
325.0000 mg | ORAL_TABLET | Freq: Two times a day (BID) | ORAL | Status: DC
  Administered 2017-02-25 – 2017-02-26 (×4): 325 mg via ORAL
  Filled 2017-02-24 (×4): qty 1

## 2017-02-24 MED ORDER — DOCUSATE SODIUM 100 MG PO CAPS
100.0000 mg | ORAL_CAPSULE | Freq: Two times a day (BID) | ORAL | Status: DC | PRN
  Administered 2017-02-24: 100 mg via ORAL
  Filled 2017-02-24: qty 1

## 2017-02-24 MED ORDER — MAGNESIUM CITRATE PO SOLN
1.0000 | Freq: Once | ORAL | Status: AC
  Administered 2017-02-24: 1 via ORAL
  Filled 2017-02-24: qty 296

## 2017-02-24 MED ORDER — CEPHALEXIN 500 MG PO CAPS
500.0000 mg | ORAL_CAPSULE | Freq: Two times a day (BID) | ORAL | Status: AC
  Administered 2017-02-24 – 2017-02-26 (×5): 500 mg via ORAL
  Filled 2017-02-24 (×5): qty 1

## 2017-02-24 MED ORDER — FLUTICASONE PROPIONATE 50 MCG/ACT NA SUSP
2.0000 | Freq: Every day | NASAL | Status: DC
  Administered 2017-02-24 – 2017-03-11 (×8): 2 via NASAL
  Filled 2017-02-24: qty 16

## 2017-02-24 MED ORDER — SENNA 8.6 MG PO TABS
1.0000 | ORAL_TABLET | Freq: Every day | ORAL | Status: DC
  Administered 2017-02-25 – 2017-02-26 (×2): 8.6 mg via ORAL
  Filled 2017-02-24 (×2): qty 1

## 2017-02-24 MED ORDER — ALUM & MAG HYDROXIDE-SIMETH 200-200-20 MG/5ML PO SUSP
15.0000 mL | Freq: Every day | ORAL | Status: DC | PRN
  Administered 2017-02-24 – 2017-02-25 (×2): 15 mL via ORAL
  Filled 2017-02-24 (×2): qty 30

## 2017-02-24 MED ORDER — FUROSEMIDE 10 MG/ML IJ SOLN
10.0000 mg/h | INTRAVENOUS | Status: DC
  Administered 2017-02-24 – 2017-02-27 (×4): 10 mg/h via INTRAVENOUS
  Filled 2017-02-24 (×3): qty 25
  Filled 2017-02-24 (×2): qty 20
  Filled 2017-02-24: qty 25
  Filled 2017-02-24 (×2): qty 20

## 2017-02-24 NOTE — Progress Notes (Signed)
Progress Note  Patient Name: Samantha Richards Date of Encounter: 02/24/2017  Primary Cardiologist: None  Subjective   Still massively volume overloaded Abdomen is tense   Inpatient Medications    Scheduled Meds: . Chlorhexidine Gluconate Cloth  6 each Topical Daily  . feeding supplement (ENSURE ENLIVE)  237 mL Oral TID BM  . fluticasone  2 spray Each Nare Daily  . insulin aspart  0-15 Units Subcutaneous Q4H  . levothyroxine  100 mcg Oral QAC breakfast  . mouth rinse  15 mL Mouth Rinse BID  . metoprolol tartrate  12.5 mg Oral BID  . multivitamin with minerals  1 tablet Oral Daily  . pantoprazole  40 mg Oral BID  . sodium chloride flush  10-40 mL Intracatheter Q12H  . thiamine  100 mg Oral Daily   Continuous Infusions: . sodium chloride    . amiodarone 30 mg/hr (02/24/17 0700)  . cefTRIAXone (ROCEPHIN)  IV Stopped (02/23/17 1020)   PRN Meds: sodium chloride, guaiFENesin-dextromethorphan, iopamidol, lip balm, ondansetron (ZOFRAN) IV, promethazine, sodium chloride flush   Vital Signs    Vitals:   02/24/17 0400 02/24/17 0512 02/24/17 0600 02/24/17 0800  BP: (!) 82/59     Pulse: 87     Resp: 20  (!) 27   Temp:    97.5 F (36.4 C)  TempSrc:    Oral  SpO2: 99%     Weight:  117.5 kg (259 lb 0.7 oz)    Height:        Intake/Output Summary (Last 24 hours) at 02/24/17 0903 Last data filed at 02/24/17 0700  Gross per 24 hour  Intake            367.4 ml  Output              250 ml  Net            117.4 ml   Filed Weights   02/21/17 0437 02/22/17 0400 02/24/17 0512  Weight: 113.8 kg (250 lb 14.1 oz) 111.9 kg (246 lb 11.1 oz) 117.5 kg (259 lb 0.7 oz)    Telemetry    Sinus tachycardia, rates 100-110 - Personally Reviewed   Physical Exam   Affect appropriate Chronically ill black female  HEENT: normal Neck supple with no adenopathy JVP elevated  no bruits no thyromegaly Lungs clear with no wheezing and good diaphragmatic motion Heart:  S1/S2 no murmur, no rub,  gallop or click PMI normal Abdomen: edematous skin and ascites tense  no bruit.  No HSM or HJR Distal pulses intact with no bruits Plus 3 edema to thighs  Neuro non-focal Skin warm and dry Both feet in boots/wrapped    Labs    Chemistry Recent Labs Lab 02/18/17 1415  02/22/17 0500 02/23/17 0308 02/24/17 0527  NA  --   < > 140 141 137  K  --   < > 4.1 4.0 4.0  CL  --   < > 113* 112* 109  CO2  --   < > 18* 19* 19*  GLUCOSE  --   < > 160* 140* 98  BUN  --   < > 59* 57* 54*  CREATININE  --   < > 1.84* 1.76* 1.85*  CALCIUM  --   < > 8.7* 8.7* 8.7*  PROT 6.0*  --   --   --   --   ALBUMIN 2.7*  --   --   --   --   AST 19  --   --   --   --  ALT 18  --   --   --   --   ALKPHOS 95  --   --   --   --   BILITOT 2.3*  --   --   --   --   GFRNONAA  --   < > 30* 32* 30*  GFRAA  --   < > 35* 37* 35*  ANIONGAP  --   < > 9 10 9   < > = values in this interval not displayed.   Hematology  Recent Labs Lab 02/22/17 0500 02/23/17 0308 02/24/17 0527  WBC 25.1* 23.3* 23.2*  RBC 3.72*  3.72* 3.70* 3.63*  HGB 8.5* 8.4* 8.4*  HCT 28.0* 27.5* 27.2*  MCV 75.3* 74.3* 74.9*  MCH 22.8* 22.7* 23.1*  MCHC 30.4 30.5 30.9  RDW 30.8* 30.4* 30.9*  PLT 239 234 278    Cardiac Enzymes  Recent Labs Lab 02/17/17 1103 02/17/17 1529 02/17/17 2136  TROPONINI <0.03 <0.03 <0.03   No results for input(s): TROPIPOC in the last 168 hours.   BNP No results for input(s): BNP, PROBNP in the last 168 hours.   DDimer No results for input(s): DDIMER in the last 168 hours.   Radiology    Dg Chest Port 1 View  Result Date: 02/22/2017 CLINICAL DATA:  Shortness of breath today, cough EXAM: PORTABLE CHEST 1 VIEW COMPARISON:  Portable exam 1603 hours compared to 02/21/2017 FINDINGS: LEFT jugular line with tip projecting over SVC. Enlargement of cardiac silhouette. Mediastinal contours and pulmonary vascularity normal. Decreased lung volumes with RIGHT basilar atelectasis. Probable streaky atelectasis  at retrocardiac LEFT lower lobe as well. No gross infiltrate, pleural effusion or pneumothorax. IMPRESSION: Probable bibasilar atelectasis. Electronically Signed   By: Lavonia Dana M.D.   On: 02/22/2017 16:17    Cardiac Studies   Transthoracic Echo (02/17/2017) - Left ventricle: Septal flattening consistant with elevated RV pressures. Systolic function was moderately to severely reduced. The estimated ejection fraction was in the range of 30% to 35%. Diffuse hypokinesis. - Mitral valve: There was mild regurgitation. - Left atrium: The atrium was mildly dilated. - Right ventricle: The cavity size was moderately dilated. - Right atrium: The atrium was moderately dilated. - Atrial septum: No defect or patent foramen ovale was identified. - Tricuspid valve: There was moderate-severe regurgitation. - Pericardium, extracardiac: A trivial pericardial effusion was identified. - Impressions: Suspect PA pressure underestimated by TR velocity due to RV dysfunction. There is moderate RV enlargement with severe hypokinesis and signs of significant cor pulmonale.  Impressions:  - Suspect PA pressure underestimated by TR velocity due to RV dysfunction. There is moderate RV enlargement with severe hypokinesis and signs of significant cor pulmonale  Patient Profile     Jadence Kinlaw not sought medical care in years and does not have a known past medical history aside from obesity. She denies ETOH, drug use and is a never smoker.  She is being seen for the evaluation of atrial fibrillation with RVR and RV overload/cor pulmonaleat the request of Dr. Lake Bells, Critical Care.   Assessment & Plan    1.   CHF :  With low EF needs lasix drip if no response would transfer to [facility] and have CHF team see with milrinone will give her 48 hours with iv lasix drip to see if improves    2. Cough: Congested and crackly cough this AM with sputum.  CXR with just atelectasis  3.OSA:  Will need  sleep study at discharge, reports waking up  and gasping for air.  4 Cor pulmonale: pt intolerant of CPAP, but willing to try again.  will need outpatient sleep study. Continue to diurese.  5. Anemia: s/p transfusion:  EGD today shows gastritis and esophagitis.  Starting protonix   6. Atrial flutter w/ RVR: continue amiodarone and beta blocker

## 2017-02-24 NOTE — Progress Notes (Signed)
PT Cancellation Note  Patient Details Name: Dillyn Joaquin MRN: [id number] DOB: December 12, 1960   Cancelled Treatment:    Reason Eval/Treat Not Completed: Patient declined, states that she has a lt going on in her stomach.  Encouraged patient but did not agree to mobilize.  Claretha Cooper 02/24/2017, 1:10 PM Tresa Endo PT [phone]

## 2017-02-24 NOTE — Progress Notes (Signed)
PROGRESS NOTE  Samantha Richards  LAG:[id number] DOB: 28-May-1961 DOA: 02/16/2017 PCP: Patient, No Pcp Per   Brief Narrative: Samantha Richards is a 56 y.o. female who presented on 6/24 after a fall at home found to be in hemorrhagic/hypovolemic and cardiogenic shock, severely anemic in the setting of weeks of slow GI bleed and Hgb 3.7. Transfusions and BiPAP provided in addition to vancomycin/ceftriaxone for cellulitis of the back. EGD 6/28 showed gastritis and esophagitis, and pt declined colonoscopy. Levophed was weaned 6/28 and the patient was transferred to hospitalist service 6/29. AFib with RVR has been managed by cardiology, and has converted to sinus rhythm. EF 30% with evidence of RV overload/cor pulmonale. She appears severely malnourished with ascites, paracentesis 6/26 of 2.5L fluid with negative cytology and culture.   Assessment & Plan: Principal Problem:   Acute blood loss anemia Active Problems:   GI bleed   Pressure injury of skin   Ascites   Generalized weakness   Symptomatic anemia   Acute on chronic combined systolic and diastolic CHF (congestive heart failure) (HCC)   Atypical atrial flutter (HCC)   Encounter for central line placement   Cardiogenic shock (HCC)   Severe protein-calorie malnutrition ([place])   Hypothyroidism  Cardiogenic shock with acute systolic CHF and RV overload/cor pulmonale: Off pressors since 6/28. Cortisol 25.8. Echo showed RV overload, diffusely hypokinetic with LVEF 30-35%. Diffuse anasarca. - Appreciate cardiology assistance, starting lasix gtt. If creatinine rises, may need advanced HF team/inotrope.   - Fluid and sodium restriction - Strict I/O  Acute blood loss anemia on suspected chronic nutritionally-mediated anemia and iron-deficiency anemia: Iron low at 16, % sat is 4. Folate and B12 ok.  - EGD 6/28 showed gastritis/esophagitis, biopsies taken for H. pylori - Pt now amenable to colonoscopy. Will call GI to schedule once more hemodynamically stable  or if rebleeding. - Continue PPI - Give PO iron (and stimulant laxative). May benefit from IV iron, but avoiding volume at this time.  - Work-up for non-hemorrhagic cause of anemia included negative haptoglobin and DAT, indirect bili wnl.  AFib with RVR:  - Per cardiology. Continuing amiodarone gtt, low dose beta blocker as tolerated. - Not a candidate for anticoagulation with profound anemia/GI bleeding from gastritis.  Severe protein-calorie malnutrition with kwashiorkor: Prealbumin undetectable, transudative ascites tapped 6/26. Likely underlying mechanism of most problems. - Nutrition consulted, protein supplementation - Continue supplements  Prediabetes: HbA1c 5.8%.  - At inpatient goal with SSI   Muscular deconditioning:  - PT consulted, anticipate only safe discharge currently would be to SNF  Renal impairment: Uncertain baseline, but SCr has improved modestly from admission 1.96 to appearing stable ~1.8, if this remains baseline, would be CKD stage III. Mild acidosis suggestive of CKD.  - Monitor BMP, UOP with diuretic.  Suspected OSA:  - CPAP while in hospital - Outpatient sleep study - Monitor pulse oximetry and nocturnal oxygen as needed  Cellulitis: Reactive leukocytosis vs. due to cellulitis. Remains afebrile. HIV NR  - Monitor blood cultures (suspected S. epidermidis contaminant initially) - Complete course of antibiotics (ceftriaxone 6/24 - 7/2) transition to keflex to complete 10 days given persistent leukocytosis. - Re-culture for fever - Vancomycin stopped 6/28. Does not appear purulent.  Hypothyroidism: TSH 11.7. Uncertain how much is due to dietary iodine deficiency.  - Started synthroid - Will need recheck in 4 weeks.   DVT prophylaxis: SCDs Code Status: Full Family Communication: None at bedside Disposition Plan: Still requiring amiodarone infusion and soft BP, remain in SDU. SNF  when stable for discharge.  Consultants:   PCCM primary until  6/29  [facility], signed off 6/29  Cardiology  Procedures:  - ECHO 6/24 >>  LVEF 30-35%, RV mod dilated, severe hypokinesis, LA dilated, mild MR, RA mod dilated - Paracentesis 6/26 > 2.5 L fluid removed, cytology negative - EGD 6/28 >> intense distal esophagitis with focal hemorrhagic hemorrhage, small hiatal hernia  Antimicrobials:  Vancomycin 6/24 - 6/28  Ceftriaxone 6/24 - 7/2  Subjective: No events overnight. Wants central line out. Denies chest pain or palpitations, still very swollen diffusely. PO intake limited by sensation of fullness.   Objective: Vitals:   02/24/17 0800 02/24/17 0817 02/24/17 1200 02/24/17 1316  BP:  98/62  90/64  Pulse:   (!) 101   Resp:  (!) 21 (!) 25 (!) 26  Temp: 97.5 F (36.4 C)  97.7 F (36.5 C)   TempSrc: Oral  Oral   SpO2:   100%   Weight:      Height:        Intake/Output Summary (Last 24 hours) at 02/24/17 1358 Last data filed at 02/24/17 1200  Gross per 24 hour  Intake           458.43 ml  Output              250 ml  Net           208.43 ml   Filed Weights   02/21/17 0437 02/22/17 0400 02/24/17 0512  Weight: 113.8 kg (250 lb 14.1 oz) 111.9 kg (246 lb 11.1 oz) 117.5 kg (259 lb 0.7 oz)    Examination: General exam: 56 y.o. female in no distress Respiratory system: Tachypneic, non-labored breathing room air. Clear to auscultation bilaterally.  Cardiovascular system: Regular tachycardia, no murmur. + JVD. Diffuse 2+ pitting edema in extremities and 1+ abd wall.  Gastrointestinal system: Abdomen non-tender, distended but soft, with normoactive bowel sounds. No organomegaly or masses felt. Central nervous system: Alert and oriented. No focal neurological deficits. Extremities: Warm, no deformities Skin: Lower back dressing c/d/i. Left upper abdominal puncture site c/d/i Psychiatry: Judgement and insight appear fair. Mood & affect appropriate.   Data Reviewed: I have personally reviewed following labs and imaging  studies  CBC:  Recent Labs Lab 02/18/17 0432 02/18/17 2011 02/19/17 0424 02/20/17 0517 02/21/17 0310 02/22/17 0500 02/23/17 0308 02/24/17 0527  WBC 21.2* 24.4* 21.5* 19.3* 20.6* 25.1* 23.3* 23.2*  NEUTROABS 18.2* 20.5* 18.3*  --   --   --   --   --   HGB 7.7* 8.5* 8.4* 8.3* 8.3* 8.5* 8.4* 8.4*  HCT 24.5* 27.3* 26.9* 27.1* 27.3* 28.0* 27.5* 27.2*  MCV 74.2* 74.2* 73.9* 74.0* 74.4* 75.3* 74.3* 74.9*  PLT 173 208 206 189 214 239 234 001   Basic Metabolic Panel:  Recent Labs Lab 02/19/17 0424 02/20/17 0517 02/21/17 0310 02/22/17 0500 02/23/17 0308 02/24/17 0527  NA 140 142 142 140 141 137  K 3.8 3.6 3.4* 4.1 4.0 4.0  CL 113* 113* 115* 113* 112* 109  CO2 18* 18* 18* 18* 19* 19*  GLUCOSE 140* 141* 140* 160* 140* 98  BUN 62* 59* 60* 59* 57* 54*  CREATININE 1.97* 1.89* 1.86* 1.84* 1.76* 1.85*  CALCIUM 8.6* 8.7* 8.5* 8.7* 8.7* 8.7*  MG 2.3  --   --   --   --   --    GFR: Estimated Creatinine Clearance: 45.1 mL/min (A) (by C-G formula based on SCr of 1.85 mg/dL (H)). Liver Function Tests:  Recent Labs Lab 02/18/17 1415  AST 19  ALT 18  ALKPHOS 95  BILITOT 2.3*  PROT 6.0*  ALBUMIN 2.7*   No results for input(s): LIPASE, AMYLASE in the last 168 hours. No results for input(s): AMMONIA in the last 168 hours. Coagulation Profile:  Recent Labs Lab 02/18/17 1415  INR 1.39   Cardiac Enzymes:  Recent Labs Lab 02/17/17 1529 02/17/17 2136  TROPONINI <0.03 <0.03   BNP (last 3 results) No results for input(s): PROBNP in the last 8760 hours. HbA1C:  Recent Labs  02/22/17 1152  HGBA1C 5.8*   CBG:  Recent Labs Lab 02/23/17 1935 02/23/17 2319 02/24/17 0319 02/24/17 0757 02/24/17 1153  GLUCAP 131* 152* 104* 119* 121*   Lipid Profile: No results for input(s): CHOL, HDL, LDLCALC, TRIG, CHOLHDL, LDLDIRECT in the last 72 hours. Thyroid Function Tests: No results for input(s): TSH, T4TOTAL, FREET4, T3FREE, THYROIDAB in the last 72 hours. Anemia  Panel:  Recent Labs  02/22/17 0500  VITAMINB12 1,202*  FOLATE 10.9  FERRITIN 15  TIBC 438  IRON 16*  RETICCTPCT 2.5   Urine analysis:    Component Value Date/Time   COLORURINE YELLOW 02/16/2017 1845   APPEARANCEUR CLEAR 02/16/2017 1845   LABSPEC 1.013 02/16/2017 1845   PHURINE 5.0 02/16/2017 1845   GLUCOSEU NEGATIVE 02/16/2017 1845   HGBUR NEGATIVE 02/16/2017 1845   BILIRUBINUR NEGATIVE 02/16/2017 1845   KETONESUR NEGATIVE 02/16/2017 1845   PROTEINUR NEGATIVE 02/16/2017 1845   NITRITE NEGATIVE 02/16/2017 1845   LEUKOCYTESUR NEGATIVE 02/16/2017 1845   Recent Results (from the past 240 hour(s))  Culture, blood (routine x 2)     Status: Abnormal   Collection Time: 02/16/17  2:10 PM  Result Value Ref Range Status   Specimen Description BLOOD RIGHT ANTECUBITAL  Final   Special Requests   Final    BOTTLES DRAWN AEROBIC AND ANAEROBIC Blood Culture adequate volume   Culture  Setup Time   Final    GRAM POSITIVE COCCI IN CLUSTERS IN BOTH AEROBIC AND ANAEROBIC BOTTLES CRITICAL RESULT CALLED TO, READ BACK BY AND VERIFIED WITH: L. Emerson, [place] 02/17/2017 T. TYSOR    Culture (A)  Final    STAPHYLOCOCCUS SPECIES (COAGULASE NEGATIVE) THE SIGNIFICANCE OF ISOLATING THIS ORGANISM FROM A SINGLE SET OF BLOOD CULTURES WHEN MULTIPLE SETS ARE DRAWN IS UNCERTAIN. PLEASE NOTIFY THE MICROBIOLOGY DEPARTMENT WITHIN ONE WEEK IF SPECIATION AND SENSITIVITIES ARE REQUIRED. Performed at [facility], [place] [address], [place], [place] [address]    Report Status 02/19/2017 FINAL  Final  Culture, blood (routine x 2)     Status: None   Collection Time: 02/16/17  2:21 PM  Result Value Ref Range Status   Specimen Description BLOOD LEFT ANTECUBITAL  Final   Special Requests   Final    BOTTLES DRAWN AEROBIC AND ANAEROBIC Blood Culture results may not be optimal due to an inadequate volume of blood received in culture bottles   Culture   Final    NO GROWTH 5 DAYS Performed at [facility], [place] [address] [place], [place] [address]    Report Status 02/21/2017 FINAL  Final  Blood Culture ID Panel (Reflexed)     Status: Abnormal   Collection Time: 02/16/17  2:21 PM  Result Value Ref Range Status   Enterococcus species NOT DETECTED NOT DETECTED Final   Listeria monocytogenes NOT DETECTED NOT DETECTED Final   Staphylococcus species DETECTED (A) NOT DETECTED Final    Comment: Methicillin (oxacillin) resistant coagulase  negative staphylococcus. Possible blood culture contaminant (unless isolated from more than one blood culture draw or clinical case suggests pathogenicity). No antibiotic treatment is indicated for blood  culture contaminants. CRITICAL RESULT CALLED TO, READ BACK BY AND VERIFIED WITH: L. POINDEXTER, [place] 02/17/2017 T. TYSOR    Staphylococcus aureus NOT DETECTED NOT DETECTED Final   Methicillin resistance DETECTED (A) NOT DETECTED Final    Comment: CRITICAL RESULT CALLED TO, READ BACK BY AND VERIFIED WITH: L. Wynona Canes, [place] 02/17/2017 T. TYSOR    Streptococcus species NOT DETECTED NOT DETECTED Final   Streptococcus agalactiae NOT DETECTED NOT DETECTED Final   Streptococcus pneumoniae NOT DETECTED NOT DETECTED Final   Streptococcus pyogenes NOT DETECTED NOT DETECTED Final   Acinetobacter baumannii NOT DETECTED NOT DETECTED Final   Enterobacteriaceae species NOT DETECTED NOT DETECTED Final   Enterobacter cloacae complex NOT DETECTED NOT DETECTED Final   Escherichia coli NOT DETECTED NOT DETECTED Final   Klebsiella oxytoca NOT DETECTED NOT DETECTED Final   Klebsiella pneumoniae NOT DETECTED NOT DETECTED Final   Proteus species NOT DETECTED NOT DETECTED Final   Serratia marcescens NOT DETECTED NOT DETECTED Final   Haemophilus influenzae NOT DETECTED NOT DETECTED Final   Neisseria meningitidis NOT DETECTED NOT DETECTED Final   Pseudomonas aeruginosa NOT DETECTED NOT DETECTED Final   Candida albicans NOT DETECTED NOT DETECTED Final   Candida  glabrata NOT DETECTED NOT DETECTED Final   Candida krusei NOT DETECTED NOT DETECTED Final   Candida parapsilosis NOT DETECTED NOT DETECTED Final   Candida tropicalis NOT DETECTED NOT DETECTED Final  MRSA PCR Screening     Status: None   Collection Time: 02/16/17  9:33 PM  Result Value Ref Range Status   MRSA by PCR NEGATIVE NEGATIVE Final    Comment:        The GeneXpert MRSA Assay (FDA approved for NASAL specimens only), is one component of a comprehensive MRSA colonization surveillance program. It is not intended to diagnose MRSA infection nor to guide or monitor treatment for MRSA infections.   Acid Fast Smear (AFB)     Status: None   Collection Time: 02/18/17 12:00 PM  Result Value Ref Range Status   AFB Specimen Processing Concentration  Final   Acid Fast Smear Negative  Final    Comment: (NOTE) Performed At: [facility] [address] [place], [place] [id number] Lindon Romp MD [id number]    Source (AFB) PERITONEAL  Final  Culture, body fluid-bottle     Status: None   Collection Time: 02/18/17 12:00 PM  Result Value Ref Range Status   Specimen Description PERITONEAL  Final   Special Requests NONE  Final   Culture   Final    NO GROWTH 5 DAYS Performed at [facility], [address] [place], [place] [address]    Report Status 02/23/2017 FINAL  Final  Gram stain     Status: None   Collection Time: 02/18/17 12:00 PM  Result Value Ref Range Status   Specimen Description PERITONEAL  Final   Special Requests NONE  Final   Gram Stain   Final    RARE WBC PRESENT, PREDOMINANTLY MONONUCLEAR NO ORGANISMS SEEN Performed at [facility], [place] [address], [place], [place] [address]    Report Status 02/18/2017 FINAL  Final      Radiology Studies: Dg Chest Port 1 View  Result Date: 02/22/2017 CLINICAL DATA:  Shortness of breath today, cough EXAM: PORTABLE CHEST 1 VIEW COMPARISON:  Portable  exam 1603 hours compared to 02/21/2017 FINDINGS: LEFT  jugular line with tip projecting over SVC. Enlargement of cardiac silhouette. Mediastinal contours and pulmonary vascularity normal. Decreased lung volumes with RIGHT basilar atelectasis. Probable streaky atelectasis at retrocardiac LEFT lower lobe as well. No gross infiltrate, pleural effusion or pneumothorax. IMPRESSION: Probable bibasilar atelectasis. Electronically Signed   By: Lavonia Dana M.D.   On: 02/22/2017 16:17    Scheduled Meds: . Chlorhexidine Gluconate Cloth  6 each Topical Daily  . feeding supplement (ENSURE ENLIVE)  237 mL Oral TID BM  . fluticasone  2 spray Each Nare Daily  . insulin aspart  0-15 Units Subcutaneous Q4H  . levothyroxine  100 mcg Oral QAC breakfast  . mouth rinse  15 mL Mouth Rinse BID  . metoprolol tartrate  12.5 mg Oral BID  . multivitamin with minerals  1 tablet Oral Daily  . pantoprazole  40 mg Oral BID  . sodium chloride flush  10-40 mL Intracatheter Q12H  . thiamine  100 mg Oral Daily   Continuous Infusions: . sodium chloride    . amiodarone 30 mg/hr (02/24/17 0955)  . cefTRIAXone (ROCEPHIN)  IV Stopped (02/24/17 1025)  . furosemide (LASIX) infusion 10 mg/hr (02/24/17 0934)     LOS: 8 days   Time spent: 25 minutes.  Vance Gather, MD [facility] Pager [phone]  If 7PM-7AM, please contact night-coverage [email or web address] Password [email or web address] 02/24/2017, 1:58 PM

## 2017-02-24 NOTE — Progress Notes (Signed)
PULMONARY / CRITICAL CARE MEDICINE   Name: Samantha Richards MRN: [id number] DOB: 04/14/1961    ADMISSION DATE:  02/16/2017 CONSULTATION DATE:  02/16/2017  REFERRING MD:  Nils Flack, PA   CHIEF COMPLAINT:  Anemia   BRIEF 56 y/o female with obesity admitted on 6/25 with anemia after a fall.  Noted some bloody stool for 2-3 weeks, but says it was only a few clots per day.  Noted to have a Hgb of 3 on admission.  SUBJECTIVE:  Pt reports she continues to be short of breath when lying flat.  No acute issues overnight.     VITAL SIGNS: BP (!) 82/59 (BP Location: Left Wrist)   Pulse 87   Temp 97.5 F (36.4 C) (Oral)   Resp (!) 27   Ht 5\' 5"  (1.651 m)   Wt 259 lb 0.7 oz (117.5 kg)   LMP 01/16/2017 Comment: neg preg 02-16-2017  SpO2 99%   BMI 43.11 kg/m   HEMODYNAMICS:    VENTILATOR SETTINGS:    INTAKE / OUTPUT: I/O last 3 completed shifts: In: 601.2 [I.V.:601.2] Out: 650 [Urine:650]  PHYSICAL EXAMINATION: General: chronically ill appearing female in NAD HEENT: MM pink/moist, JVD elevated  PSY: calm/appropriate  Neuro: AAOx4, speech clear, MAE CV: s1s2 rrr, no m/r/g PULM: even/non-labored, lungs bilaterally diminished with basilar crackles  VH:QION, non-tender, bsx4 active  Extremities: warm/dry, 3+ pitting edema up to thighs/abd  Skin: no rashes or lesions   LABS:  BMET  Recent Labs Lab 02/22/17 0500 02/23/17 0308 02/24/17 0527  NA 140 141 137  K 4.1 4.0 4.0  CL 113* 112* 109  CO2 18* 19* 19*  BUN 59* 57* 54*  CREATININE 1.84* 1.76* 1.85*  GLUCOSE 160* 140* 98    Electrolytes  Recent Labs Lab 02/19/17 0424  02/22/17 0500 02/23/17 0308 02/24/17 0527  CALCIUM 8.6*  < > 8.7* 8.7* 8.7*  MG 2.3  --   --   --   --   < > = values in this interval not displayed.  CBC  Recent Labs Lab 02/22/17 0500 02/23/17 0308 02/24/17 0527  WBC 25.1* 23.3* 23.2*  HGB 8.5* 8.4* 8.4*  HCT 28.0* 27.5* 27.2*  PLT 239 234 278    Coag's  Recent Labs Lab 02/18/17 1415   INR 1.39    Sepsis Markers  Recent Labs Lab 02/20/17 1634 02/22/17 1200 02/23/17 0308 02/24/17 0527  LATICACIDVEN 1.6  --   --   --   PROCALCITON  --  0.44 0.35 0.35    ABG No results for input(s): PHART, PCO2ART, PO2ART in the last 168 hours.  Liver Enzymes  Recent Labs Lab 02/18/17 1415  AST 19  ALT 18  ALKPHOS 95  BILITOT 2.3*  ALBUMIN 2.7*    Cardiac Enzymes  Recent Labs Lab 02/17/17 1103 02/17/17 1529 02/17/17 2136  TROPONINI <0.03 <0.03 <0.03    Glucose  Recent Labs Lab 02/23/17 1145 02/23/17 1612 02/23/17 1935 02/23/17 2319 02/24/17 0319 02/24/17 0757  GLUCAP 136* 151* 131* 152* 104* 119*    Imaging No results found.   STUDIES:  CXR 6/24 > Cardiomegaly, moderate to severe degree, central vascular pulmonary congestion and probable mild perihilar interstitial edema suggesting mild CHF/volume overload  XR right Knee 6/24 > No Acute ECHO 6/24 >>  LVEF 30-35%, RV mod dilated, severe hypokinesis, LA dilated, mild MR, RA mod dilated CT abdomen/pelvis 6/26> notable ascites, hepatic steatosis, large uterine fibroids vs peritoneal mets Paracentesis 6/26 > 2.5 L fluid removed, cytology negative EGD 6/28 >>  intense distal esophagitis with focal hemorrhagic hemorrhage, small hiatal hernia 7/02  Lasix gtt initiated   CULTURES: Blood 6/24 >> coag neg staph 2/4 Peritoneal fluid 6/26 >> neg HIV 6/29 >> negative  ANTIBIOTICS: Vancomycin 6/24 > 6/28 Rocephin 6/24 >>  SIGNIFICANT EVENTS: 6/24  Presents to ED  6/29  Off vasopressors 7/02  Lasix gtt initiated  LINES/TUBES: PIV   DISCUSSION: 56 y/o female admitted with critical anemia in the setting of non-brisk GI bleeding, found to have shock, presumed AKI and systolic heart failure with RV overload.  Has required vasopressor support for presumed mixed hypovolemic (hemorrhagic) and cardiogenic shock.  Noted on CT abdomen to have large uterine fibroids and ascites.  ASSESSMENT /  PLAN:  PULMONARY A: Cough, chest congestion Probable sleep apnea, intolerant of CPAP in hospital P:   O2 to support sats > 95% Pulmonary hygiene- mobilize, IS Will need outpatient sleep study Intermittent CXR  CARDIOVASCULAR A:  Cardiogenic shock > hypotensive again on 6/28 RV overload/cor pulmonale Acute systolic heart failure, ddx ischemic vs non-ischemic (nutritional related?) Afib with RVR > better P:  Cardiology following, appreciate input  Tele monitoring  CVP Q Shift  Lasix gtt initiated  Accept MAP > 55 with good mental status & UOP Continue amiodarone gtt  Fluid / Na restriction    RENAL A:   Acute Kidney Injury, presumed (unclear baseline) > improving P:   Trend BMP / urinary output Replace electrolytes as indicated Avoid nephrotoxic agents, ensure adequate renal perfusion  GASTROINTESTINAL A:   Severe protein-calorie malnutrition: Kwashiorkor; prealbumin < 5, history supports poor po intake for months Slow GI bleeding Some upper gi bleeding with vomiting Hepatic steatosis Ascites > cytology negative Peritoneal mets vs uterine fibroids P:   GI following, appreciate input  EGD as above  PPI BID  Nutrition is vitally important  Thiamine + Folate  Defer colonoscopy timing to GI  HEMATOLOGIC A:   Anemia, bleeding appears to be out of proportion to blood per stool Little evidence of hemoylsis: Coombs neg, haptoglobin wnl P:  Trend CBC  Transfuse for Hgb <7  INFECTIOUS A:   Skin breakdown/wounds present on admission Cellulitis Probable blood culture contamination (staph epi 2/4 cultures) P:   Wound care Monitor skin break down on legs from severe anasarca Rocephin per primary   ENDOCRINE A:   Mild hyperglycemia Hypothyroid > TSH 11.7 P:   Synthroid  Follow up TSH in 6 weeks > around 8/1 SSI   NEUROLOGIC A:   Muscular deconditioning P:   PT   FAMILY  - Updates: Patient updated at bedside 7/2 on plan of care  -  Inter-disciplinary family meet or Palliative Care meeting due by:  02/23/2017.  - Global:  She will likely need short term rehab efforts and should not go back to her prior living situation  PCCM will be available PRN.  Please call back if new needs arise.   [name], NP-C [facility] Pgr: [phone] or if no answer [phone] 02/24/2017, 9:33 AM

## 2017-02-25 LAB — BASIC METABOLIC PANEL
Anion gap: 9 (ref 5–15)
BUN: 56 mg/dL — AB (ref 6–20)
CALCIUM: 8.7 mg/dL — AB (ref 8.9–10.3)
CO2: 20 mmol/L — AB (ref 22–32)
CREATININE: 1.88 mg/dL — AB (ref 0.44–1.00)
Chloride: 110 mmol/L (ref 101–111)
GFR calc non Af Amer: 29 mL/min — ABNORMAL LOW (ref >60)
GFR, EST AFRICAN AMERICAN: 34 mL/min — AB (ref >60)
GLUCOSE: 104 mg/dL — AB (ref 65–99)
Potassium: 4.1 mmol/L (ref 3.5–5.1)
Sodium: 139 mmol/L (ref 135–145)

## 2017-02-25 LAB — CBC
HCT: 26.9 % — ABNORMAL LOW (ref 36.0–46.0)
Hemoglobin: 7.9 g/dL — ABNORMAL LOW (ref 12.0–15.0)
MCH: 21.8 pg — AB (ref 26.0–34.0)
MCHC: 29.4 g/dL — AB (ref 30.0–36.0)
MCV: 74.1 fL — AB (ref 78.0–100.0)
Platelets: 267 10*3/uL (ref 150–400)
RBC: 3.63 MIL/uL — ABNORMAL LOW (ref 3.87–5.11)
RDW: 30.4 % — AB (ref 11.5–15.5)
WBC: 21 10*3/uL — ABNORMAL HIGH (ref 4.0–10.5)

## 2017-02-25 LAB — GLUCOSE, CAPILLARY
GLUCOSE-CAPILLARY: 97 mg/dL (ref 65–99)
GLUCOSE-CAPILLARY: 121 mg/dL — AB (ref 65–99)
GLUCOSE-CAPILLARY: 159 mg/dL — AB (ref 65–99)
Glucose-Capillary: 115 mg/dL — ABNORMAL HIGH (ref 65–99)
Glucose-Capillary: 115 mg/dL — ABNORMAL HIGH (ref 65–99)
Glucose-Capillary: 106 mg/dL — ABNORMAL HIGH (ref 65–99)

## 2017-02-25 MED ORDER — PROMETHAZINE HCL 25 MG RE SUPP
25.0000 mg | Freq: Four times a day (QID) | RECTAL | Status: DC | PRN
  Administered 2017-02-28: 25 mg via RECTAL
  Filled 2017-02-25 (×3): qty 1

## 2017-02-25 MED ORDER — ACETAMINOPHEN 325 MG PO TABS
650.0000 mg | ORAL_TABLET | ORAL | Status: DC | PRN
  Administered 2017-02-25 – 2017-03-08 (×8): 650 mg via ORAL
  Filled 2017-02-25 (×9): qty 2

## 2017-02-25 NOTE — Progress Notes (Signed)
Nutrition Follow-up  DOCUMENTATION CODES:   Obesity unspecified  INTERVENTION:  - Continue Ensure Enlive TID. - Continue to encourage PO intakes of meals with a focus on protein, reviewed protein sources with pt. - RD will continue to monitor for needs.  NUTRITION DIAGNOSIS:   Inadequate oral intake related to acute illness, poor appetite as evidenced by per patient/family report. -ongoing  GOAL:   Patient will meet greater than or equal to 90% of their needs -unmet  MONITOR:   PO intake, Supplement acceptance, Weight trends, Labs, Skin, I & O's  ASSESSMENT:   56 year old female with PMH of Obesity presents to ED on 6/24 with complaints of weakness and pain for the last 3 days. 2 weeks prior had a mechanical fall which resulted in right knee pain. Patient reports that she has not had anything to eat or drink as she has been laying in bed for 3 days unable to move, reports she lives with a roommate however they refused to help. EMS arrived to house on 6/21 and found patient laying face down on an air-mattress, however refused help. EMS reports that living situation was poor and house was in un-livable conditions.  7/3 Per chart review, pt consumed 25% of breakfast yesterday which pt reports was a few bites of [place] toast, a bite of [place] sausage, and a few grapes. She reports yesterday was a very bad day for her, she was not feeling well, and reports need for disimpaction (prior to this pt had not had a BM since 6/25). She reports all of this led to decreased desire to eat and poor intakes yesterday. For breakfast this AM she drank 100% of Ensure Enlive (350 kcal and 20 grams of protein).   Reviewed importance of protein with pt and she is able to list good protein sources. Ordered lunch per pt request: ground chicken breast with [place] dressing, collard greens, broccoli, pinto beans, and bottled water. Pt states that meats are tough to chew sometimes and would like all meats to be  ground; communicated this with [facility].   Weight continues to trend up and is now +10.3 kg from admission. Per Cardiology note this AM, pt has been on Lasix drip (10 mg/hr) since 7/1 and plan is to continue this for another 48 hours. If no improvement, plan to transfer to [facility] and start milrinone.   Medications reviewed; 325 mg ferrous sulfate BID, sliding scale Novolog, 100 mcg oral Synthroid/day, daily multivitamin with minerals, 40 mg oral Protonix BID, 1 tablet Senokot/day, 100 mg oral thiamine/day.  Labs reviewed; CBGs: 97 and 115 mg/dL this AM, BUN: 56 mg/dL, creatinine: 1.88 mg/dL, Ca: 8.7 mg/dL, GFR: 34 mL/min.     6/29 - No intakes documented since diet advancement.  - Pt states she ate most of dinner last night which was baked fish, rice, and corn.  - She requested RD order breakfast: scrambled egg, grits, cinnamon raisin bagel.  - Talked with pt about protein sources that are in line with Heart Healthy diet; used examples of foods based on pt's diet recall from the past few meals and also her food preferences.  - Pt was drinking Equate brand protein shakes PTA and likes strawberry flavor.  - She is very interested in ONS during admission.  - Encouraged pt to use supplement to take medications.  - She had EGD yesterday with GI note stating: intense distal esophagitis with focal hemorrhagic appearance no definite esophageal ulcer with small hiatal hernia. Cardiology PA note from  this AM states: total body anasarca significantly improved.  - Weight today stable with weight yesterday. Current weight is +6 kg from admission.    6/26 - Pt remains on CLD at this time.  - She had ultrasound-guided paracentesis this afternoon with 2.5L removed (max ordered for removal).  - She feels that liquids go down esophagus and into stomach better following fluid removal.  - She is having pain in legs but no complaints of abdominal pain/pressure or nausea at this time.  - She is very hopeful for diet  advancement.  - RN reports that pt had bloody emesis with all PO intakes yesterday.  - Weight +5.7 kg from admission but unsure if this is related to fluid, change in scales, or other factors. - Will continue to monitor weight trends closely now that paracentesis done. Physical assessment again shows no muscle or fat wasting.   Remain unable to state malnutrition based on ASPEN guidelines but will continue to monitor and document additional information throughout hospitalization.    Diet Order:  Diet Heart Room service appropriate? Yes; Fluid consistency: Thin; Fluid restriction: 2000 mL Fluid  Skin:  Wound (see comment) (Stage 2 bilateral buttocks pressure injuries)  Last BM:  7/2  Height:   Ht Readings from Last 1 Encounters:  02/16/17 5\' 5"  (1.651 m)    Weight:   Wt Readings from Last 1 Encounters:  02/25/17 260 lb 5.8 oz (118.1 kg)    Ideal Body Weight:  56.82 kg  BMI:  Body mass index is 43.33 kg/m.  Estimated Nutritional Needs:   Kcal:  1620-1940 (15-18 kcal/kg)  Protein:  90-100 grams  Fluid:  per MD given severe edema, GIB  EDUCATION NEEDS:   Education needs addressed    Jarome Matin, MS, RD, LDN, CNSC Inpatient Clinical Dietitian Pager # [phone] After hours/weekend pager # [phone]

## 2017-02-25 NOTE — Progress Notes (Signed)
Physical Therapy Treatment Patient Details Name: Samantha Richards MRN: [id number] DOB: 1961-05-06 Today's Date: 02/25/2017    History of Present Illness patient is a 56 y.o. female who presented on 6/24 after a fall at home found to be in hemorrhagic/hypovolemic and cardiogenic shock, severely anemic in the setting of weeks of slow GI bleed and Hgb 3.7.with anemia after a fall also found to have Acute systolic CHF and anasarca    PT Comments    The  Patient was motivated today to mobilize to recliner. Requires 2 assist.  Continue PT while in acute care.   Follow Up Recommendations  SNF     Equipment Recommendations  TBA   Recommendations for Other Services       Precautions / Restrictions Precautions Precautions: Fall Precaution Comments: BP has been running low    Mobility  Bed Mobility Overal bed mobility: Needs Assistance Bed Mobility: Supine to Sit     Supine to sit: Max assist;HOB elevated;+2 for physical assistance     General bed mobility comments: verbal cues for technique, assist for lower body and some for upper body upright, pt requesting assist and encouraged her to perform as much as possible however requiring assistance  Transfers Overall transfer level: Needs assistance Equipment used: Rolling walker (2 wheeled) Transfers: Sit to/from [facility] Sit to Stand: Mod assist;+2 physical assistance;From elevated surface Stand pivot transfers: Mod assist;+2 physical assistance       General transfer comment: verbal cues for safe technique, difficulty performing rise and controlling descent, encouraged WBing through UEs on RW , SAT INTO RECLINER QUICKLY  Ambulation/Gait                 Stairs            Wheelchair Mobility    Modified Rankin (Stroke Patients Only)       Balance                                            Cognition Arousal/Alertness: Awake/alert Behavior During Therapy: WFL for tasks  assessed/performed                                          Exercises      General Comments        Pertinent Vitals/Pain Pain Assessment: Faces Faces Pain Scale: Hurts little more Pain Location: all over, ABDOMEN Pain Descriptors / Indicators: Tender Pain Intervention(s): Monitored during session;Premedicated before session    Home Living                      Prior Function            PT Goals (current goals can now be found in the care plan section) Progress towards PT goals: Progressing toward goals    Frequency    Min 3X/week      PT Plan Current plan remains appropriate    Co-evaluation              AM-PAC PT "6 Clicks" Daily Activity  Outcome Measure  Difficulty turning over in bed (including adjusting bedclothes, sheets and blankets)?: Total Difficulty moving from lying on back to sitting on the side of the bed? : Total Difficulty sitting down on and standing up from  a chair with arms (e.g., wheelchair, bedside commode, etc,.)?: Total Help needed moving to and from a bed to chair (including a wheelchair)?: Total Help needed walking in hospital room?: Total Help needed climbing 3-5 steps with a railing? : Total 6 Click Score: 6    End of Session   Activity Tolerance: Patient tolerated treatment well Patient left: in chair;with call bell/phone within reach;with nursing/sitter in room Nurse Communication: Mobility status       Time: 6734-1937 PT Time Calculation (min) (ACUTE ONLY): 16 min  Charges:  $Therapeutic Activity: 8-22 mins                    G CodesTresa Endo PT [phone]   Claretha Cooper 02/25/2017, 2:04 PM

## 2017-02-25 NOTE — Progress Notes (Signed)
   02/25/17 1300  Clinical Encounter Type  Visited With Patient  Visit Type Follow-up;Psychological support;Spiritual support;Critical Care  Referral From Nurse  Consult/Referral To Chaplain  Spiritual Encounters  Spiritual Needs Prayer;Emotional;Other (Comment)  Stress Factors  Patient Stress Factors Health changes;Major life changes   I visited with the patient as a follow up from a previous visit. I brought the patient a prayer shawl. The patient stated that she was feeling down today; but that her faith is getting her through this difficult time. She was very appreciative of how staff have cared for her at our facility.  The patient told me that she wants to get better so that she can go out and help other people.  She requested prayer. We prayed together and she asked for a follow-up.   Please, contact Spiritual Care for further assistance.   [place] M.Div.

## 2017-02-25 NOTE — Progress Notes (Signed)
Progress Note  Patient Name: Samantha Richards Date of Encounter: 02/25/2017  Primary Cardiologist: None  Subjective   Still massively volume overloaded with anasarca. She did not get OOB yesterday due to dyspnea. Is mildly better today. She is orthopneic with shortness of breath when laying back to use bedpan. No chest pain.   Inpatient Medications    Scheduled Meds: . cephALEXin  500 mg Oral Q12H  . Chlorhexidine Gluconate Cloth  6 each Topical Daily  . feeding supplement (ENSURE ENLIVE)  237 mL Oral TID BM  . ferrous sulfate  325 mg Oral BID WC  . fluticasone  2 spray Each Nare Daily  . insulin aspart  0-15 Units Subcutaneous Q4H  . levothyroxine  100 mcg Oral QAC breakfast  . mouth rinse  15 mL Mouth Rinse BID  . metoprolol tartrate  12.5 mg Oral BID  . multivitamin with minerals  1 tablet Oral Daily  . pantoprazole  40 mg Oral BID  . senna  1 tablet Oral Daily  . sodium chloride flush  10-40 mL Intracatheter Q12H  . thiamine  100 mg Oral Daily   Continuous Infusions: . sodium chloride    . amiodarone 30 mg/hr (02/25/17 0600)  . furosemide (LASIX) infusion 10 mg/hr (02/25/17 0600)   PRN Meds: sodium chloride, alum & mag hydroxide-simeth, guaiFENesin-dextromethorphan, iopamidol, lip balm, ondansetron (ZOFRAN) IV, promethazine, sodium chloride flush   Vital Signs    Vitals:   02/25/17 0312 02/25/17 0414 02/25/17 0500 02/25/17 0736  BP:  (!) 85/65  (!) 100/49  Pulse:  98  93  Resp:  (!) 23  (!) 21  Temp: 97.8 F (36.6 C)     TempSrc: Oral     SpO2:  94%  97%  Weight:   260 lb 5.8 oz (118.1 kg)   Height:        Intake/Output Summary (Last 24 hours) at 02/25/17 0801 Last data filed at 02/25/17 0600  Gross per 24 hour  Intake           998.43 ml  Output             1730 ml  Net          -731.57 ml   Filed Weights   02/22/17 0400 02/24/17 0512 02/25/17 0500  Weight: 246 lb 11.1 oz (111.9 kg) 259 lb 0.7 oz (117.5 kg) 260 lb 5.8 oz (118.1 kg)    Telemetry      Atrial flutter rates in the 80's - Personally Reviewed  EKG 02/24/17: atrial flutter with 2:1 conduction at 100 bpm with low voltage QRS  Physical Exam   Affect appropriate Chronically ill obese black female  HEENT: normal Neck supple with no adenopathy JVP elevated,  no bruits no thyromegaly Lungs clear with no wheezing and good diaphragmatic motion Heart:  S1/S2 no murmur, no rub, gallop or click PMI normal Abdomen: edematous skin and ascites, tense  no bruit.  No HSM or HJR Distal pulses intact with no bruits 3+ edema to thighs  Neuro non-focal Skin warm and dry Both feet in boots/wrapped    Labs    Chemistry Recent Labs Lab 02/18/17 1415  02/23/17 0308 02/24/17 0527 02/25/17 0305  NA  --   < > 141 137 139  K  --   < > 4.0 4.0 4.1  CL  --   < > 112* 109 110  CO2  --   < > 19* 19* 20*  GLUCOSE  --   < >  140* 98 104*  BUN  --   < > 57* 54* 56*  CREATININE  --   < > 1.76* 1.85* 1.88*  CALCIUM  --   < > 8.7* 8.7* 8.7*  PROT 6.0*  --   --   --   --   ALBUMIN 2.7*  --   --   --   --   AST 19  --   --   --   --   ALT 18  --   --   --   --   ALKPHOS 95  --   --   --   --   BILITOT 2.3*  --   --   --   --   GFRNONAA  --   < > 32* 30* 29*  GFRAA  --   < > 37* 35* 34*  ANIONGAP  --   < > 10 9 9   < > = values in this interval not displayed.   Hematology  Recent Labs Lab 02/23/17 0308 02/24/17 0527 02/25/17 0305  WBC 23.3* 23.2* 21.0*  RBC 3.70* 3.63* 3.63*  HGB 8.4* 8.4* 7.9*  HCT 27.5* 27.2* 26.9*  MCV 74.3* 74.9* 74.1*  MCH 22.7* 23.1* 21.8*  MCHC 30.5 30.9 29.4*  RDW 30.4* 30.9* 30.4*  PLT 234 278 267    Cardiac Enzymes No results for input(s): TROPONINI in the last 168 hours. No results for input(s): TROPIPOC in the last 168 hours.   BNP No results for input(s): BNP, PROBNP in the last 168 hours.   DDimer No results for input(s): DDIMER in the last 168 hours.   Radiology    No results found.  Cardiac Studies   Transthoracic Echo  (02/17/2017) - Left ventricle: Septal flattening consistant with elevated RV pressures. Systolic function was moderately to severely reduced. The estimated ejection fraction was in the range of 30% to 35%. Diffuse hypokinesis. - Mitral valve: There was mild regurgitation. - Left atrium: The atrium was mildly dilated. - Right ventricle: The cavity size was moderately dilated. - Right atrium: The atrium was moderately dilated. - Atrial septum: No defect or patent foramen ovale was identified. - Tricuspid valve: There was moderate-severe regurgitation. - Pericardium, extracardiac: A trivial pericardial effusion was identified. - Impressions: Suspect PA pressure underestimated by TR velocity due to RV dysfunction. There is moderate RV enlargement with severe hypokinesis and signs of significant cor pulmonale.  Impressions:  - Suspect PA pressure underestimated by TR velocity due to RV dysfunction. There is moderate RV enlargement with severe hypokinesis and signs of significant cor pulmonale  Patient Profile     Samantha Richards not sought medical care in years and does not have a known past medical history aside from obesity. She denies ETOH, drug use and is a never smoker.  She is being seen for the evaluation of atrial fibrillation with RVR and RV overload/cor pulmonaleat the request of Dr. Lake Bells, Critical Care.   Assessment & Plan    1.   CHF :  With low EF Lasix drip is infusing at 10mg /hr started yesterday. Pt has had 1.7L UOP with a net negative 681 ml fluid balance. She is positive on fluid balance 3.8L since admission. Wt on admission was 237 lbs and has increased slowly. Today wt 260 lbs, up from 259 yesterday. SCr has remained stable at 1.88 today. If she does not improve significantly on lasix drip, Dr. Johnsie Cancel recommends transfer to [facility] and have CHF team evaluate for possible milrinone  therapy.   2. Cough:  Improved. CXR with just atelectasis  3.OSA:   Will need sleep study at discharge, reports waking up and gasping for air.  4 Cor pulmonale: pt intolerant of CPAP, but willing to try again.  Will need outpatient sleep study. Continue to diurese.  5. Anemia: s/p transfusion: EGD 6/28 shows gastritis and esophagitis. Started on protonix. Hgb 7.9 today. Being managed by hospitalist.   6. Atrial flutter w/ RVR: Is in rate controlled atrial flutter with 2:1 conduction. Continue amiodarone and beta blocker    Daune Perch, AGNP-C 02/25/2017  8:15 AM Pager: [phone]   Patient examined chart reviewed Not clear that weights are accurate More urine output with lasix drip Feels less tense in abdomen. Still with ascites tense abdomen and plus 3 LE edema to thigh Will give iv lasix another 48 hours and if no significant improvement transfer to [facility] consider milrinone given low EF   Jenkins Rouge

## 2017-02-25 NOTE — Progress Notes (Addendum)
PROGRESS NOTE  Samantha Richards  ZOX:[id number] DOB: April 16, 1961 DOA: 02/16/2017 PCP: Patient, No Pcp Per   Brief Narrative: Samantha Richards is a 56 y.o. female who presented on 6/24 after a fall at home found to be in hemorrhagic/hypovolemic and cardiogenic shock, severely anemic in the setting of weeks of slow GI bleed and Hgb 3.7. Transfusions and BiPAP provided in addition to vancomycin/ceftriaxone for cellulitis of the back. EGD 6/28 showed gastritis and esophagitis, and pt declined colonoscopy. Levophed was weaned 6/28 and the patient was transferred to hospitalist service 6/29. AFib with RVR has been managed by cardiology, and has converted to sinus rhythm. EF 30% with evidence of RV overload/cor pulmonale. She appears severely malnourished with anasarca with ascites, paracentesis 6/26 of 2.5L fluid with negative cytology and culture. Lasix infusion was started 7/2 with hope of effective diuresis without inotrope support.   Assessment & Plan: Principal Problem:   Acute blood loss anemia Active Problems:   GI bleed   Pressure injury of skin   Ascites   Generalized weakness   Symptomatic anemia   Acute on chronic combined systolic and diastolic CHF (congestive heart failure) (HCC)   Atypical atrial flutter (HCC)   Encounter for central line placement   Cardiogenic shock (HCC)   Severe protein-calorie malnutrition ([place])   Hypothyroidism  Cardiogenic shock with acute systolic CHF and RV overload/cor pulmonale: Off pressors since 6/28. Cortisol 25.8. Echo showed RV overload, diffusely hypokinetic with LVEF 30-35%. Diffuse anasarca. - Appreciate cardiology assistance, starting lasix gtt. If creatinine rises, may need advanced HF team/inotrope. Continue central line. - Fluid and sodium restriction - Strict I/O - ?wet beriberi. Less likely without neuropathy. No confabulation to suggest Wernicke-Koraskoff. Thiamine level not checked prior to supplementation > Continue MVM, thiamine.   Acute blood  loss anemia on suspected chronic nutritionally-mediated anemia and iron-deficiency anemia: Iron low at 16, % sat is 4. Folate and B12 ok.  - EGD 6/28 showed gastritis/esophagitis, biopsies taken for H. pylori - Pt now amenable to colonoscopy. Will call GI to schedule once more hemodynamically stable or if hgb continues trending downward. Hgb mildly dropped 7/3 without evidence of bleeding.  - Continue PPI - Give PO iron (and stimulant laxative). May benefit from IV iron, but avoiding volume at this time.  - Work-up for non-hemorrhagic cause of anemia included negative haptoglobin and DAT, indirect bili wnl.  AFib with RVR:  - Per cardiology. Continuing amiodarone gtt, low dose beta blocker as tolerated. - Not a candidate for anticoagulation with profound anemia/GI bleeding from gastritis.  Severe protein-calorie malnutrition: Prealbumin undetectable, transudative ascites tapped 6/26. Likely underlying mechanism of most problems. - Nutrition consulted, protein supplementation - Continue supplements including thiamine  Prediabetes: HbA1c 5.8%.  - At inpatient goal with SSI   Muscular deconditioning: In setting of chronic malnutrition. - PT consulted, anticipate only safe discharge currently would be to SNF  Renal impairment: Uncertain baseline, but SCr has improved modestly from admission 1.96 to appearing stable ~1.8, if this remains baseline, would be CKD stage III. Mild acidosis suggestive of CKD.  - Monitor BMP, UOP with diuretic.  Suspected OSA:  - CPAP while in hospital - Outpatient sleep study - Monitor pulse oximetry and nocturnal oxygen as needed  Cellulitis: Reactive leukocytosis vs. due to cellulitis. Remains afebrile. HIV NR. Blood cultures suspected S. epidermidis contaminant initially.  - Complete course of antibiotics (ceftriaxone 6/24 - 7/2) transition to keflex to complete 10 days given persistent leukocytosis. - Re-culture if fever - Vancomycin stopped 6/28.  Does not  appear purulent.  Hypothyroidism: TSH 11.7. Uncertain how much is due to dietary iodine deficiency.  - Started synthroid - Will need recheck in 4 weeks.   DVT prophylaxis: SCDs Code Status: Full Family Communication: None at bedside Disposition Plan: Still requiring amiodarone infusion, lasix gtt and soft BP, remain in SDU. Possible transfer to [facility] for advanced HF team consult/milrinone if diuresis limited by poor CO. SNF when stable for discharge.  Consultants:   PCCM primary until 6/29  [facility], signed off 6/29  Cardiology, Dr. Johnsie Cancel  Procedures:  - ECHO 6/24:  LVEF 30-35%, RV mod dilated, severe hypokinesis, LA dilated, mild MR, RA mod dilated - Paracentesis 6/26: > 2.5 L fluid removed, cytology negative - EGD 6/28: intense distal esophagitis with focal hemorrhagic hemorrhage, small hiatal hernia - Left IJ triple lumen CVC 6/26  Antimicrobials:  Vancomycin 6/24 - 6/28  Ceftriaxone 6/24 - 7/2  Subjective: Discomfort related to left central line, constipated, having hemorrhoid pain. Dyspnea stable, worse when supine. Refused PT yesterday.    Objective: Vitals:   02/25/17 0414 02/25/17 0500 02/25/17 0700 02/25/17 0736  BP: (!) 85/65   (!) 100/49  Pulse: 98   93  Resp: (!) 23   (!) 21  Temp:   97.2 F (36.2 C)   TempSrc:   Oral   SpO2: 94%   97%  Weight:  118.1 kg (260 lb 5.8 oz)    Height:        Intake/Output Summary (Last 24 hours) at 02/25/17 0913 Last data filed at 02/25/17 0700  Gross per 24 hour  Intake          1025.13 ml  Output             1730 ml  Net          -704.87 ml   Filed Weights   02/22/17 0400 02/24/17 0512 02/25/17 0500  Weight: 111.9 kg (246 lb 11.1 oz) 117.5 kg (259 lb 0.7 oz) 118.1 kg (260 lb 5.8 oz)    Examination: General exam: 56 y.o. female in no distress Respiratory system: Tachypneic, non-labored breathing room air. Clear to auscultation bilaterally.  Cardiovascular system: Regular tachycardia, no murmur. + JVD. Diffuse 2+  pitting edema in LE's and dependent on abd wall.  Gastrointestinal system: Abdomen non-tender, distended but soft, with normoactive bowel sounds. No organomegaly or masses felt. Central nervous system: Alert and oriented. No focal neurological deficits. Extremities: Warm, no deformities Skin: Lower back dressing c/d/i. Left upper abdominal puncture site c/d/i Psychiatry: Judgement and insight appear fair. Mood & affect appropriate.   Data Reviewed: I have personally reviewed following labs and imaging studies  CBC:  Recent Labs Lab 02/18/17 2011 02/19/17 0424  02/21/17 0310 02/22/17 0500 02/23/17 0308 02/24/17 0527 02/25/17 0305  WBC 24.4* 21.5*  < > 20.6* 25.1* 23.3* 23.2* 21.0*  NEUTROABS 20.5* 18.3*  --   --   --   --   --   --   HGB 8.5* 8.4*  < > 8.3* 8.5* 8.4* 8.4* 7.9*  HCT 27.3* 26.9*  < > 27.3* 28.0* 27.5* 27.2* 26.9*  MCV 74.2* 73.9*  < > 74.4* 75.3* 74.3* 74.9* 74.1*  PLT 208 206  < > 214 239 234 278 267  < > = values in this interval not displayed. Basic Metabolic Panel:  Recent Labs Lab 02/19/17 0424  02/21/17 0310 02/22/17 0500 02/23/17 0308 02/24/17 0527 02/25/17 0305  NA 140  < > 142 140 141 137  139  K 3.8  < > 3.4* 4.1 4.0 4.0 4.1  CL 113*  < > 115* 113* 112* 109 110  CO2 18*  < > 18* 18* 19* 19* 20*  GLUCOSE 140*  < > 140* 160* 140* 98 104*  BUN 62*  < > 60* 59* 57* 54* 56*  CREATININE 1.97*  < > 1.86* 1.84* 1.76* 1.85* 1.88*  CALCIUM 8.6*  < > 8.5* 8.7* 8.7* 8.7* 8.7*  MG 2.3  --   --   --   --   --   --   < > = values in this interval not displayed. GFR: Estimated Creatinine Clearance: 44.5 mL/Richards (A) (by C-G formula based on SCr of 1.88 mg/dL (H)). Liver Function Tests:  Recent Labs Lab 02/18/17 1415  AST 19  ALT 18  ALKPHOS 95  BILITOT 2.3*  PROT 6.0*  ALBUMIN 2.7*   No results for input(s): LIPASE, AMYLASE in the last 168 hours. No results for input(s): AMMONIA in the last 168 hours. Coagulation Profile:  Recent Labs Lab  02/18/17 1415  INR 1.39   Cardiac Enzymes: No results for input(s): CKTOTAL, CKMB, CKMBINDEX, TROPONINI in the last 168 hours. BNP (last 3 results) No results for input(s): PROBNP in the last 8760 hours. HbA1C:  Recent Labs  02/22/17 1152  HGBA1C 5.8*   CBG:  Recent Labs Lab 02/24/17 1558 02/24/17 1920 02/24/17 2309 02/25/17 0311 02/25/17 0754  GLUCAP 115* 100* 115* 97 115*   Lipid Profile: No results for input(s): CHOL, HDL, LDLCALC, TRIG, CHOLHDL, LDLDIRECT in the last 72 hours. Thyroid Function Tests: No results for input(s): TSH, T4TOTAL, FREET4, T3FREE, THYROIDAB in the last 72 hours. Anemia Panel: No results for input(s): VITAMINB12, FOLATE, FERRITIN, TIBC, IRON, RETICCTPCT in the last 72 hours. Urine analysis:    Component Value Date/Time   COLORURINE YELLOW 02/16/2017 1845   APPEARANCEUR CLEAR 02/16/2017 1845   LABSPEC 1.013 02/16/2017 1845   PHURINE 5.0 02/16/2017 1845   GLUCOSEU NEGATIVE 02/16/2017 1845   HGBUR NEGATIVE 02/16/2017 1845   BILIRUBINUR NEGATIVE 02/16/2017 1845   KETONESUR NEGATIVE 02/16/2017 1845   PROTEINUR NEGATIVE 02/16/2017 1845   NITRITE NEGATIVE 02/16/2017 1845   LEUKOCYTESUR NEGATIVE 02/16/2017 1845   Recent Results (from the past 240 hour(s))  Culture, blood (routine x 2)     Status: Abnormal   Collection Time: 02/16/17  2:10 PM  Result Value Ref Range Status   Specimen Description BLOOD RIGHT ANTECUBITAL  Final   Special Requests   Final    BOTTLES DRAWN AEROBIC AND ANAEROBIC Blood Culture adequate volume   Culture  Setup Time   Final    GRAM POSITIVE COCCI IN CLUSTERS IN BOTH AEROBIC AND ANAEROBIC BOTTLES CRITICAL RESULT CALLED TO, READ BACK BY AND VERIFIED WITH: L. [place], [place] 02/17/2017 T. TYSOR    Culture (A)  Final    STAPHYLOCOCCUS SPECIES (COAGULASE NEGATIVE) THE SIGNIFICANCE OF ISOLATING THIS ORGANISM FROM A SINGLE SET OF BLOOD CULTURES WHEN MULTIPLE SETS ARE DRAWN IS UNCERTAIN. PLEASE NOTIFY THE  MICROBIOLOGY DEPARTMENT WITHIN ONE WEEK IF SPECIATION AND SENSITIVITIES ARE REQUIRED. Performed at [facility], [place] [address] [place], [place] [address]    Report Status 02/19/2017 FINAL  Final  Culture, blood (routine x 2)     Status: None   Collection Time: 02/16/17  2:21 PM  Result Value Ref Range Status   Specimen Description BLOOD LEFT ANTECUBITAL  Final   Special Requests   Final    BOTTLES  DRAWN AEROBIC AND ANAEROBIC Blood Culture results may not be optimal due to an inadequate volume of blood received in culture bottles   Culture   Final    NO GROWTH 5 DAYS Performed at [facility], [place] [address], [place], [place] [address]    Report Status 02/21/2017 FINAL  Final  Blood Culture ID Panel (Reflexed)     Status: Abnormal   Collection Time: 02/16/17  2:21 PM  Result Value Ref Range Status   Enterococcus species NOT DETECTED NOT DETECTED Final   Listeria monocytogenes NOT DETECTED NOT DETECTED Final   Staphylococcus species DETECTED (A) NOT DETECTED Final    Comment: Methicillin (oxacillin) resistant coagulase negative staphylococcus. Possible blood culture contaminant (unless isolated from more than one blood culture draw or clinical case suggests pathogenicity). No antibiotic treatment is indicated for blood  culture contaminants. CRITICAL RESULT CALLED TO, READ BACK BY AND VERIFIED WITH: L. POINDEXTER, [place] 02/17/2017 T. TYSOR    Staphylococcus aureus NOT DETECTED NOT DETECTED Final   Methicillin resistance DETECTED (A) NOT DETECTED Final    Comment: CRITICAL RESULT CALLED TO, READ BACK BY AND VERIFIED WITH: L. Wynona Canes, [place] 02/17/2017 T. TYSOR    Streptococcus species NOT DETECTED NOT DETECTED Final   Streptococcus agalactiae NOT DETECTED NOT DETECTED Final   Streptococcus pneumoniae NOT DETECTED NOT DETECTED Final   Streptococcus pyogenes NOT DETECTED NOT DETECTED Final   Acinetobacter baumannii NOT DETECTED NOT DETECTED Final    Enterobacteriaceae species NOT DETECTED NOT DETECTED Final   Enterobacter cloacae complex NOT DETECTED NOT DETECTED Final   Escherichia coli NOT DETECTED NOT DETECTED Final   Klebsiella oxytoca NOT DETECTED NOT DETECTED Final   Klebsiella pneumoniae NOT DETECTED NOT DETECTED Final   Proteus species NOT DETECTED NOT DETECTED Final   Serratia marcescens NOT DETECTED NOT DETECTED Final   Haemophilus influenzae NOT DETECTED NOT DETECTED Final   Neisseria meningitidis NOT DETECTED NOT DETECTED Final   Pseudomonas aeruginosa NOT DETECTED NOT DETECTED Final   Candida albicans NOT DETECTED NOT DETECTED Final   Candida glabrata NOT DETECTED NOT DETECTED Final   Candida krusei NOT DETECTED NOT DETECTED Final   Candida parapsilosis NOT DETECTED NOT DETECTED Final   Candida tropicalis NOT DETECTED NOT DETECTED Final  MRSA PCR Screening     Status: None   Collection Time: 02/16/17  9:33 PM  Result Value Ref Range Status   MRSA by PCR NEGATIVE NEGATIVE Final    Comment:        The GeneXpert MRSA Assay (FDA approved for NASAL specimens only), is one component of a comprehensive MRSA colonization surveillance program. It is not intended to diagnose MRSA infection nor to guide or monitor treatment for MRSA infections.   Acid Fast Smear (AFB)     Status: None   Collection Time: 02/18/17 12:00 PM  Result Value Ref Range Status   AFB Specimen Processing Concentration  Final   Acid Fast Smear Negative  Final    Comment: (NOTE) Performed At: [facility] [address] [place], [place] [id number] Lindon Romp MD WI:[id number]    Source (AFB) PERITONEAL  Final  Culture, body fluid-bottle     Status: None   Collection Time: 02/18/17 12:00 PM  Result Value Ref Range Status   Specimen Description PERITONEAL  Final   Special Requests NONE  Final   Culture   Final    NO GROWTH 5 DAYS Performed at [facility], [address] [place], [place]  [address]    Report Status  02/23/2017 FINAL  Final  Gram stain     Status: None   Collection Time: 02/18/17 12:00 PM  Result Value Ref Range Status   Specimen Description PERITONEAL  Final   Special Requests NONE  Final   Gram Stain   Final    RARE WBC PRESENT, PREDOMINANTLY MONONUCLEAR NO ORGANISMS SEEN Performed at [facility], [place] [address] [place], [place] [address]    Report Status 02/18/2017 FINAL  Final      Radiology Studies: No results found.  Scheduled Meds: . cephALEXin  500 mg Oral Q12H  . Chlorhexidine Gluconate Cloth  6 each Topical Daily  . feeding supplement (ENSURE ENLIVE)  237 mL Oral TID BM  . ferrous sulfate  325 mg Oral BID WC  . fluticasone  2 spray Each Nare Daily  . insulin aspart  0-15 Units Subcutaneous Q4H  . levothyroxine  100 mcg Oral QAC breakfast  . mouth rinse  15 mL Mouth Rinse BID  . metoprolol tartrate  12.5 mg Oral BID  . multivitamin with minerals  1 tablet Oral Daily  . pantoprazole  40 mg Oral BID  . senna  1 tablet Oral Daily  . sodium chloride flush  10-40 mL Intracatheter Q12H  . thiamine  100 mg Oral Daily   Continuous Infusions: . sodium chloride    . amiodarone 30 mg/hr (02/25/17 0600)  . furosemide (LASIX) infusion 10 mg/hr (02/25/17 0600)     LOS: 9 days   Time spent: 25 minutes.  Vance Gather, MD [facility] Pager [phone]  If 7PM-7AM, please contact night-coverage [email or web address] Password [email or web address] 02/25/2017, 9:13 AM

## 2017-02-26 LAB — COOXEMETRY PANEL
CARBOXYHEMOGLOBIN: 1.2 % (ref 0.5–1.5)
CARBOXYHEMOGLOBIN: 2.1 % — AB (ref 0.5–1.5)
METHEMOGLOBIN: 1.2 % (ref 0.0–1.5)
Methemoglobin: 2 % — ABNORMAL HIGH (ref 0.0–1.5)
O2 SAT: 57.2 %
O2 SAT: 70.7 %
Total hemoglobin: 8.1 g/dL — ABNORMAL LOW (ref 12.0–16.0)
Total hemoglobin: 7.9 g/dL — ABNORMAL LOW (ref 12.0–16.0)

## 2017-02-26 LAB — CBC
HCT: 26.1 % — ABNORMAL LOW (ref 36.0–46.0)
HEMOGLOBIN: 8.1 g/dL — AB (ref 12.0–15.0)
MCH: 22.9 pg — AB (ref 26.0–34.0)
MCHC: 31 g/dL (ref 30.0–36.0)
MCV: 73.9 fL — ABNORMAL LOW (ref 78.0–100.0)
Platelets: 299 10*3/uL (ref 150–400)
RBC: 3.53 MIL/uL — AB (ref 3.87–5.11)
RDW: 30.8 % — ABNORMAL HIGH (ref 11.5–15.5)
WBC: 22.9 10*3/uL — ABNORMAL HIGH (ref 4.0–10.5)

## 2017-02-26 LAB — GLUCOSE, CAPILLARY
GLUCOSE-CAPILLARY: 114 mg/dL — AB (ref 65–99)
GLUCOSE-CAPILLARY: 93 mg/dL (ref 65–99)
GLUCOSE-CAPILLARY: 151 mg/dL — AB (ref 65–99)
GLUCOSE-CAPILLARY: 102 mg/dL — AB (ref 65–99)
Glucose-Capillary: 102 mg/dL — ABNORMAL HIGH (ref 65–99)

## 2017-02-26 LAB — BASIC METABOLIC PANEL
ANION GAP: 11 (ref 5–15)
BUN: 56 mg/dL — ABNORMAL HIGH (ref 6–20)
CHLORIDE: 107 mmol/L (ref 101–111)
CO2: 20 mmol/L — ABNORMAL LOW (ref 22–32)
Calcium: 8.8 mg/dL — ABNORMAL LOW (ref 8.9–10.3)
Creatinine, Ser: 2.03 mg/dL — ABNORMAL HIGH (ref 0.44–1.00)
GFR calc non Af Amer: 27 mL/min — ABNORMAL LOW (ref >60)
GFR, EST AFRICAN AMERICAN: 31 mL/min — AB (ref >60)
Glucose, Bld: 120 mg/dL — ABNORMAL HIGH (ref 65–99)
POTASSIUM: 3.6 mmol/L (ref 3.5–5.1)
SODIUM: 138 mmol/L (ref 135–145)

## 2017-02-26 LAB — T4, FREE: FREE T4: 1.08 ng/dL (ref 0.61–1.12)

## 2017-02-26 MED ORDER — SODIUM CHLORIDE 0.9 % IV SOLN
510.0000 mg | Freq: Once | INTRAVENOUS | Status: AC
  Administered 2017-02-26: 510 mg via INTRAVENOUS
  Filled 2017-02-26 (×2): qty 17

## 2017-02-26 MED ORDER — HEPARIN SODIUM (PORCINE) 5000 UNIT/ML IJ SOLN
5000.0000 [IU] | Freq: Three times a day (TID) | INTRAMUSCULAR | Status: DC
  Administered 2017-02-26 – 2017-02-27 (×2): 5000 [IU] via SUBCUTANEOUS
  Filled 2017-02-26 (×2): qty 1

## 2017-02-26 MED ORDER — CAMPHOR-MENTHOL 0.5-0.5 % EX LOTN
TOPICAL_LOTION | CUTANEOUS | Status: DC | PRN
  Administered 2017-03-05: 1 via TOPICAL
  Filled 2017-02-26 (×2): qty 222

## 2017-02-26 MED ORDER — MILRINONE LACTATE IN DEXTROSE 20-5 MG/100ML-% IV SOLN
0.1250 ug/kg/min | INTRAVENOUS | Status: DC
  Administered 2017-02-26 – 2017-02-28 (×6): 0.25 ug/kg/min via INTRAVENOUS
  Administered 2017-03-01: 0.125 ug/kg/min via INTRAVENOUS
  Administered 2017-03-01: 0.25 ug/kg/min via INTRAVENOUS
  Administered 2017-03-02 – 2017-03-04 (×3): 0.125 ug/kg/min via INTRAVENOUS
  Filled 2017-02-26 (×11): qty 100

## 2017-02-26 MED ORDER — DIPHENHYDRAMINE HCL 50 MG/ML IJ SOLN
25.0000 mg | Freq: Once | INTRAMUSCULAR | Status: AC
  Administered 2017-02-26: 25 mg via INTRAVENOUS
  Filled 2017-02-26: qty 1

## 2017-02-26 NOTE — Consult Note (Addendum)
Advanced Heart Failure Team Consult Note   Primary Physician: Nahser   Reason for Consultation: Biventricular HF  HPI:    Samantha Richards is seen today for evaluation of severe biventricular HF at the request of Dr. Acie Fredrickson. .   56 y/o woman with morbid obesity and little previous medical follow-up. Says she moved from [place] several years ago to be with family.   Said she was fine up until about a month ago with no significant PMhx when she developed "a hemorrhoid" with slow bleeding. 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 Says she slipped and fell about 1 month ago and has been very weak since.   Admitted to [facility] on 6/24 after a fall at home found to be in hemorrhagic/hypovolemic and cardiogenic shock, severely anemic (hgb 3.7). Also found to be in AFL with RVR. Transfusions and BiPAP provided in addition to vancomycin/ceftriaxone for cellulitis of the back. EGD 6/28 showed gastritis and sevree esophagitis, and pt declined colonoscopy. Levophed was weaned 6/28 and the patient was transferred to hospitalist service 6/29. Started on amio for AFL. Echo with EF 30% with evidence of severe RV overload/cor pulmonale. Had CT abdomen with anasarca, large volume ascites and severe fibroid uterus. Underwen  paracentesis 6/26 of 2.5L fluid with negative cytology and culture. Lasix infusion was started 7/2 with hope of effective diuresis however diurese has been ineffective (weight 246->261) so transferred to [facility] for evaluation by HF team.   Denies CP, orthopnea or PND currently. Co-ox was 60% on 6/28.    Review of Systems: [y] = yes, [ ]  = no   General: Weight gain Blue.Reese ]; Weight loss [ ] ; Anorexia [ y]; Fatigue [ y]; Fever [ ] ; Chills [ ] ; Weakness [ ]   Cardiac: Chest pain/pressure [ ] ; Resting SOB Blue.Reese ]; Exertional SOB [ y]; Orthopnea [ ] ; Pedal Edema [ y]; Palpitations [ ] ; Syncope [ ] ; Presyncope [ ] ; Paroxysmal nocturnal dyspnea[ ]   Pulmonary: Cough [ ] ; Wheezing[ ] ; Hemoptysis[ ] ;  Sputum [ ] ; Snoring [ ]   GI: Vomiting[ ] ; Dysphagia[ ] ; Melena[ ] ; Hematochezia [ ] ; Heartburn[ ] ; Abdominal pain [ ] ; Constipation [ ] ; Diarrhea [ ] ; BRBPR [ ]   GU: Hematuria[ ] ; Dysuria [ ] ; Nocturia[ ]   Vascular: Pain in legs with walking [ ] ; Pain in feet with lying flat [ ] ; Non-healing sores Blue.Reese ]; Stroke [ ] ; TIA [ ] ; Slurred speech [ ] ;  Neuro: Headaches[ ] ; Vertigo[ ] ; Seizures[ ] ; Paresthesias[ ] ;Blurred vision [ ] ; Diplopia [ ] ; Vision changes [ ]   Ortho/Skin: Arthritis Blue.Reese ]; Joint pain Blue.Reese ]; Muscle pain [ ] ; Joint swelling [ ] ; Back Pain [ ] ; Rash [ ]   Psych: Depression[ ] ; Anxiety[ ]   Heme: Bleeding problems [ ] ; Clotting disorders [ ] ; Anemia Blue.Reese ]  Endocrine: Diabetes [ ] ; Thyroid dysfunction[y ]  Home Medications Prior to Admission medications   Not on File    Past Medical History: History reviewed. No pertinent past medical history.  Past Surgical History: Past Surgical History:  Procedure Laterality Date  . ESOPHAGOGASTRODUODENOSCOPY (EGD) WITH PROPOFOL N/A 02/20/2017   Procedure: ESOPHAGOGASTRODUODENOSCOPY (EGD) WITH PROPOFOL;  Surgeon: Teena Irani, MD;  Location: [facility];  Service: Endoscopy;  Laterality: N/A;    Family History: Family History  Problem Relation Age of Onset  . Diabetes Mother   . Hypertension Mother   . Heart disease Father   . Diabetes Sister     Social History: Social History  Social History  . Marital status: Single    Spouse name: N/A  . Number of children: N/A  . Years of education: N/A   Social History Main Topics  . Smoking status: Never Smoker  . Smokeless tobacco: Never Used  . Alcohol use No  . Drug use: No  . Sexual activity: Not Asked   Other Topics Concern  . None   Social History Narrative  . None    Allergies:  Allergies  Allergen Reactions  . Gentamicin Itching and Rash    Objective:    Vital Signs:   Temp:  [97.4 F (36.3 C)-97.8 F (36.6 C)] 97.6 F (36.4 C) (07/04 1400) Pulse Rate:   [83-98] 84 (07/04 1500) Resp:  [15-29] 15 (07/04 1500) BP: (80-104)/(42-74) 91/74 (07/04 1400) SpO2:  [93 %-100 %] 100 % (07/04 1500) Weight:  [118.6 kg (261 lb 7.5 oz)] 118.6 kg (261 lb 7.5 oz) (07/04 0500) Last BM Date: 02/20/17  Weight change: Filed Weights   02/25/17 0500 02/25/17 1700 02/26/17 0500  Weight: 118.1 kg (260 lb 5.8 oz) 116.7 kg (257 lb 4.4 oz) 118.6 kg (261 lb 7.5 oz)    Intake/Output:   Intake/Output Summary (Last 24 hours) at 02/26/17 1713 Last data filed at 02/26/17 1700  Gross per 24 hour  Intake           1260.8 ml  Output             1925 ml  Net           -664.2 ml      Physical Exam    General:  Obese female lying in bed. Weak. Ill appearing HEENT: normal anicteric Neck: supple. JVP to jaw . Carotids 2+ bilat; no bruits. LIJ TLC No lymphadenopathy or thyromegaly appreciated. Cor: PMI nondisplaced. IRR IRR +RV lift 2/6 TR Lungs: clear Abdomen: obese soft, nontender, + distended. No hepatosplenomegaly. No bruits or masses. Good bowel sounds. Extremities: no cyanosis, clubbing, rash. + spoon nails. Cool. Diffuse 3+ edema. Peeling skin. Dressing on RLE  Neuro: alert & orientedx3, cranial nerves grossly intact. moves all 4 extremities w/o difficulty. Affect pleasant   Telemetry   AFL 80-90s  EKG    Atrial flutter low volts Personally reviewed   Labs   Basic Metabolic Panel:  Recent Labs Lab 02/22/17 0500 02/23/17 0308 02/24/17 0527 02/25/17 0305 02/26/17 0330  NA 140 141 137 139 138  K 4.1 4.0 4.0 4.1 3.6  CL 113* 112* 109 110 107  CO2 18* 19* 19* 20* 20*  GLUCOSE 160* 140* 98 104* 120*  BUN 59* 57* 54* 56* 56*  CREATININE 1.84* 1.76* 1.85* 1.88* 2.03*  CALCIUM 8.7* 8.7* 8.7* 8.7* 8.8*    Liver Function Tests: No results for input(s): AST, ALT, ALKPHOS, BILITOT, PROT, ALBUMIN in the last 168 hours. No results for input(s): LIPASE, AMYLASE in the last 168 hours. No results for input(s): AMMONIA in the last 168  hours.  CBC:  Recent Labs Lab 02/22/17 0500 02/23/17 0308 02/24/17 0527 02/25/17 0305 02/26/17 0330  WBC 25.1* 23.3* 23.2* 21.0* 22.9*  HGB 8.5* 8.4* 8.4* 7.9* 8.1*  HCT 28.0* 27.5* 27.2* 26.9* 26.1*  MCV 75.3* 74.3* 74.9* 74.1* 73.9*  PLT 239 234 278 267 299    Cardiac Enzymes: No results for input(s): CKTOTAL, CKMB, CKMBINDEX, TROPONINI in the last 168 hours.  BNP: BNP (last 3 results)  Recent Labs  02/16/17 1516  BNP 923.6*    ProBNP (last 3 results) No results for  input(s): PROBNP in the last 8760 hours.   CBG:  Recent Labs Lab 02/25/17 1957 02/25/17 2332 02/26/17 0337 02/26/17 0838 02/26/17 1255  GLUCAP 115* 106* 114* 93 151*    Coagulation Studies: No results for input(s): LABPROT, INR in the last 72 hours.   Imaging    No results found.   Medications:     Current Medications: . cephALEXin  500 mg Oral Q12H  . Chlorhexidine Gluconate Cloth  6 each Topical Daily  . feeding supplement (ENSURE ENLIVE)  237 mL Oral TID BM  . ferrous sulfate  325 mg Oral BID WC  . fluticasone  2 spray Each Nare Daily  . insulin aspart  0-15 Units Subcutaneous Q4H  . levothyroxine  100 mcg Oral QAC breakfast  . mouth rinse  15 mL Mouth Rinse BID  . metoprolol tartrate  12.5 mg Oral BID  . multivitamin with minerals  1 tablet Oral Daily  . pantoprazole  40 mg Oral BID  . senna  1 tablet Oral Daily  . sodium chloride flush  10-40 mL Intracatheter Q12H  . thiamine  100 mg Oral Daily     Infusions: . sodium chloride    . amiodarone 30 mg/hr (02/26/17 1700)  . furosemide (LASIX) infusion 10 mg/hr (02/26/17 1700)  . milrinone         Patient Profile   56 y/o woman with morbid obesity and little previous medical follow-up. Admitted 6/24 with severe anemia (hgb 3.7) and cardiogenic shock due biventricular HF R>L. Echo reviewed EF 30-35% with severe RV Failure and PAH. Has anasarca and progressive renal failure with inability to mobilize fluid.  Transferred to [facility] on 7/4 for further evaluation by CHF team.    Assessment/Plan   1. 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 EF 30% with severe PAH and RV failure with D-shaped septum - Etiology of HF unclear DDx includes: tachy induced vs amyloid vs OHS vs hypothyroidism/myxedema. Low volts on ECG concerning for amyloid.  - Low output on exam with marked volume overload. - Will check co-ox and CVP. Start milrinone can add dopamine as needed to protect BP - Stop b-blocker. Continue lasix gtt. Increase as needed.  - HIV negative. Will check serologies. - Check SPEP and UPEP -  Eventually will need VQ and R/L heart cath and sleep study  2. Iron deficiency anemia - Hgb on admit was 3.7. With low MCV. Iron stores low. - EGD with severe esophagitis  - Hgb now 8.1  - Will give feraheme. Continue PPI.  - Will need colonoscopy- ? Inflammatory bowel disease or colon CA  3. Atrial flutter - Currently rate controlled on amio. Will continue - Not on AC due to anemia. Will eventually need AC followed by DC-CV and possible ablation - Will challenge with SQ heparin for DVT prophylaxis  4. AKI  - unclear baseline. Creatinine on admit was 1.9. Up to 2.03 today.  - No blood or protein on UA - hopefully will improved with diuresis and inotropic support - Will need renal u/s eventually. No obstruction seen on CT.  5. Anasarca/ascites in setting of severe protein calorie malnutitions - CT abdomen reviewed personally shows marked hepatic steatosis - Likely due to combination of RHF and fatty liver. Albumin 2.7. Pre-al   6. Hypothyroidism - Likely long-standing with spoon nails on exam. - TSH 11. Already started on synthroid - No T3 or T4 on chart. Will check. Also check cortisol.  - Unclear what role this  is playing in her cardiomyopathy.  7. Leukocytosis - ? Related to LE cellulitis. PCT only 0.4. UA negative - Complete course of antibiotics (ceftriaxone  6/24 - 7/2) transition to keflex to complete 10 days given persistent leukocytosis. - No evidence of malignancy on CT. Will follow. May need heme to see.   8. Severe fibroid uterus  9. Morbid obesity - will need nutrition to see  10. RLE wound - WOC to see  11. Probable OSA/OHS - [place] as needed   Overall she is critically ill with MSOF in setting of severe biventricular HF of uncertain duration. Unifying diagnosis not clear at this point - endocrine issues and possible malabsorption likely involved. Work-up as above.   CRITICAL CARE Performed by: Glori Bickers  Total critical care time: 60 minutes  Critical care time was exclusive of separately billable procedures and treating other patients.  Critical care was necessary to treat or prevent imminent or life-threatening deterioration.  Critical care was time spent personally by me (independent of midlevel providers or residents) on the following activities: development of treatment plan with patient and/or surrogate as well as nursing, discussions with consultants, evaluation of patient's response to treatment, examination of patient, obtaining history from patient or surrogate, ordering and performing treatments and interventions, ordering and review of laboratory studies, ordering and review of radiographic studies, pulse oximetry and re-evaluation of patient's condition.  Glori Bickers, MD  5:50 PM     Length of Stay: 10  Glori Bickers, MD  02/26/2017, 5:13 PM  Advanced Heart Failure Team Pager [phone] (M-F; [place])  Please contact [facility] for night-coverage after hours (4p -7a ) and weekends on [email or web address]

## 2017-02-26 NOTE — Progress Notes (Signed)
Progress Note  Patient Name: Samantha Richards Date of Encounter: 02/26/2017  Primary Cardiologist: Ovida Delagarza  Subjective   56 yo admitted with severe anemia ( Hb 3.7) , rapid atrial fib, hypotension, malnutrition, severe systolic CHF, cor pulmonale.   Has gradually improved on amio drip,  HR is well controlled.  Now on a lasix drip,   I/O + 4.1 liters so far this admission.    Inpatient Medications    Scheduled Meds: . cephALEXin  500 mg Oral Q12H  . Chlorhexidine Gluconate Cloth  6 each Topical Daily  . feeding supplement (ENSURE ENLIVE)  237 mL Oral TID BM  . ferrous sulfate  325 mg Oral BID WC  . fluticasone  2 spray Each Nare Daily  . insulin aspart  0-15 Units Subcutaneous Q4H  . levothyroxine  100 mcg Oral QAC breakfast  . mouth rinse  15 mL Mouth Rinse BID  . metoprolol tartrate  12.5 mg Oral BID  . multivitamin with minerals  1 tablet Oral Daily  . pantoprazole  40 mg Oral BID  . senna  1 tablet Oral Daily  . sodium chloride flush  10-40 mL Intracatheter Q12H  . thiamine  100 mg Oral Daily   Continuous Infusions: . sodium chloride    . amiodarone 30 mg/hr (02/26/17 0600)  . furosemide (LASIX) infusion 10 mg/hr (02/26/17 0600)   PRN Meds: sodium chloride, acetaminophen, alum & mag hydroxide-simeth, camphor-menthol, guaiFENesin-dextromethorphan, iopamidol, lip balm, ondansetron (ZOFRAN) IV, promethazine, sodium chloride flush    Vital Signs    Vitals:   02/26/17 0403 02/26/17 0500 02/26/17 0718 02/26/17 0739  BP: (!) 92/42  (!) 80/58 90/63  Pulse: 92  85 84  Resp: (!) 29  18 (!) 21  Temp:      TempSrc:      SpO2: 98%  96% 95%  Weight:  261 lb 7.5 oz (118.6 kg)    Height:        Intake/Output Summary (Last 24 hours) at 02/26/17 0742 Last data filed at 02/26/17 0600  Gross per 24 hour  Intake          1194.11 ml  Output              920 ml  Net           274.11 ml   Filed Weights   02/25/17 0500 02/25/17 1700 02/26/17 0500  Weight: 260 lb 5.8 oz (118.1 kg)  257 lb 4.4 oz (116.7 kg) 261 lb 7.5 oz (118.6 kg)    Telemetry    Atrial flutter with 3:1 AV block  - Personally Reviewed  ECG     rapid AF  - Personally Reviewed  Physical Exam   GEN: middle age , black female,  Appears chronically ill, profound anasarca  Neck: + JVD Cardiac: RRR,  Soft murmur  Respiratory: Clear to auscultation bilaterally. GI:  tense edema , anasarca  MS:  2-3+ leg edema  Neuro:  Nonfocal  Psych: Normal affect   Labs    Chemistry Recent Labs Lab 02/24/17 0527 02/25/17 0305 02/26/17 0330  NA 137 139 138  K 4.0 4.1 3.6  CL 109 110 107  CO2 19* 20* 20*  GLUCOSE 98 104* 120*  BUN 54* 56* 56*  CREATININE 1.85* 1.88* 2.03*  CALCIUM 8.7* 8.7* 8.8*  GFRNONAA 30* 29* 27*  GFRAA 35* 34* 31*  ANIONGAP 9 9 11      Hematology Recent Labs Lab 02/24/17 0527 02/25/17 0305 02/26/17 0330  WBC 23.2*  21.0* 22.9*  RBC 3.63* 3.63* 3.53*  HGB 8.4* 7.9* 8.1*  HCT 27.2* 26.9* 26.1*  MCV 74.9* 74.1* 73.9*  MCH 23.1* 21.8* 22.9*  MCHC 30.9 29.4* 31.0  RDW 30.9* 30.4* 30.8*  PLT 278 267 299    Cardiac EnzymesNo results for input(s): TROPONINI in the last 168 hours. No results for input(s): TROPIPOC in the last 168 hours.   BNPNo results for input(s): BNP, PROBNP in the last 168 hours.   DDimer No results for input(s): DDIMER in the last 168 hours.   Radiology    No results found.  Cardiac Studies     Patient Profile     56 y.o. female admitted with profound anemia, malnutrition, rapid atrial flutter, acute on chronic systolic CHF, cor pulmonale.   Assessment & Plan    1.  Acute on chronic CHF:   Making slow progress.  HR is much better on amio We are slowly diuresing - I think she would make more progress on Milrinone .   May need Levophed also to maintain BP  Have discussed with Dr. Haroldine Laws.   He has agreed to consult on her .   [facility] .  2.  Atrial flutter:   HR is much better.   Continue amio for now Is not currently a  candidate for anticoagulation.     3.   Anemia:   Has gastritis and esophagitis. Colonoscopy to be done later.    Signed, Mertie Moores, MD  02/26/2017, 7:42 AM

## 2017-02-26 NOTE — Progress Notes (Signed)
Patient ID: Samantha Richards, female   DOB: [birth year], 56 y.o.   MRN: [id number]  PROGRESS NOTE    Samantha Richards  WGN:[id number] DOB: [birth year] DOA: 02/16/2017  PCP: Patient, No Pcp Per   Brief Narrative:  56 y.o. female who presented on 6/24 after a fall at home found to be in hemorrhagic/hypovolemic and cardiogenic shock, severely anemic in the setting of slow GI bleed and Hgb 3.7. Transfusions and BiPAP provided in addition to vancomycin/ceftriaxone for cellulitis of the back. EGD 6/28 showed gastritis and esophagitis, and pt declined colonoscopy. Levophed was weaned 6/28 and the patient was transferred to hospitalist service 6/29. AFib with RVR has been managed by cardiology, and has converted to sinus rhythm. EF 30% with evidence of RV overload/cor pulmonale. She appears severely malnourished with anasarca with ascites, paracentesis 6/26 of 2.5L fluid with negative cytology and culture. Lasix infusion was started 7/2.  Per cardiology assessment, pt may benefit from milrinone drip for which reason she would best be served in [facility], transfer order placed this am.   Assessment & Plan:   Principal Problem: Cardiogenic shock with acute systolic and diastolic CHF and RV overload and cor pulmonale - Off pressor support since 6/28 - Cortisol 25.8 - ECHO showed RV overload, diffusely hypokinetic and LVEF 30-35% - Pt has diffuse anasarca - Started on lasix drip but per cardio will benefit from milrinone drip - Plan to transfer to [facility] for continue HF management   Active Problems: Acute blood loss anemia / Acute upper GI bleed / hematemesis / Iron deficiency anemia - Symptomatic anemia with hgb of 3.7 on admission - Occult negative on admission - EGD 6/28 showed gastritis / esophagitis - GI signed off 6/29, recommended to call back when pt more stable and willing to undergo colonoscopy  - Continue Protonix 40 mg BID - Continue ferrous sulfate supplementation   Ascites - SAAG > 1.1 - Negative SBP -  S/P paracentesis   CKD stage 3 - Cr continues to trend up, likely in the setting of IV lasix  - Per cardio, plan to switch to milrinone drip   Generalized weakness - In the setting of acute decompensated CHF - PT recommended SNF placement  Atrial fibrillation with RVR - CHADS vasc score at least 3 - On amiodarone drip, metoprolol 12.5 mg PO BID - Cardiology following - Not on [facility] due to risk of GI bleed   Cellulitis, right lower extremity / Leukocytosis  - Currently on cephalexin, started 7/2, to complete for total of 10 days - Vanco and rocephin stopped   Hypothyroidism - TSH 11.7 - Started synthroid   Obesity / Severe protein calorie malnutrition  - Counseled on diet - Seen by nutritionist   Stage 2 pressure skin injury to bilateral buttocks and right lower extremity  - Seen by wound care, appreciate their assessment - Bilateral buttocks 18 cm x 30 cm with partial thickness tissue loss - Right lateral and posterior foot with 15 cm x 15 cm area of maceration with serous exudate - Added bariatric mattress, moisture barrier ointment     DVT prophylaxis: SCD's Code Status: full code  Family Communication: no family at the bedside this am Disposition Plan: transfer to [facility]    Consultants:   Cardiology, Dr. Mertie Moores  GI, [place]   Pulmonary / CCM (while pt on pressor support)  Nutrition  PT - SNF recommended   WOC  Procedures:   Paracentesis 6/26 - 2.5 L fluid removed   B/  LE venous doppler 6/25 - no DVT  EGD 6/28 - distal esophagitis and gastritis   Antimicrobials:   Vanco 6/24 --> 6/28  Rocephin 6/24 --> 7/2  Cephalexin 7/2 -->   Subjective: No overnight events.   Objective: Vitals:   02/26/17 0500 02/26/17 0718 02/26/17 0739 02/26/17 0800  BP:  (!) 80/58 90/63   Pulse:  85 84 85  Resp:  18 (!) 21 (!) 25  Temp:      TempSrc:      SpO2:  96% 95% 95%  Weight: 118.6 kg (261 lb 7.5 oz)     Height:        Intake/Output  Summary (Last 24 hours) at 02/26/17 0833 Last data filed at 02/26/17 0600  Gross per 24 hour  Intake          1194.11 ml  Output              920 ml  Net           274.11 ml   Filed Weights   02/25/17 0500 02/25/17 1700 02/26/17 0500  Weight: 118.1 kg (260 lb 5.8 oz) 116.7 kg (257 lb 4.4 oz) 118.6 kg (261 lb 7.5 oz)    Examination:  General exam: Appears calm and comfortable  Respiratory system: Diminished breath sounds, no wheezing  Cardiovascular system: S1 & S2 heard, Rate controlled, (+) JVD Gastrointestinal system: Abdomen is nondistended, soft and nontender. No organomegaly or masses felt. Normal bowel sounds heard. Central nervous system: Alert and oriented. No focal neurological deficits. Extremities: swelling in B/L LE Skin: Right le wound, swelling in both extremities +3 Psychiatry: Judgement and insight appear normal. Mood & affect appropriate.   Data Reviewed: I have personally reviewed following labs and imaging studies  CBC:  Recent Labs Lab 02/22/17 0500 02/23/17 0308 02/24/17 0527 02/25/17 0305 02/26/17 0330  WBC 25.1* 23.3* 23.2* 21.0* 22.9*  HGB 8.5* 8.4* 8.4* 7.9* 8.1*  HCT 28.0* 27.5* 27.2* 26.9* 26.1*  MCV 75.3* 74.3* 74.9* 74.1* 73.9*  PLT 239 234 278 267 983   Basic Metabolic Panel:  Recent Labs Lab 02/22/17 0500 02/23/17 0308 02/24/17 0527 02/25/17 0305 02/26/17 0330  NA 140 141 137 139 138  K 4.1 4.0 4.0 4.1 3.6  CL 113* 112* 109 110 107  CO2 18* 19* 19* 20* 20*  GLUCOSE 160* 140* 98 104* 120*  BUN 59* 57* 54* 56* 56*  CREATININE 1.84* 1.76* 1.85* 1.88* 2.03*  CALCIUM 8.7* 8.7* 8.7* 8.7* 8.8*   GFR: Estimated Creatinine Clearance: 41.3 mL/min (A) (by C-G formula based on SCr of 2.03 mg/dL (H)). Liver Function Tests: No results for input(s): AST, ALT, ALKPHOS, BILITOT, PROT, ALBUMIN in the last 168 hours. No results for input(s): LIPASE, AMYLASE in the last 168 hours. No results for input(s): AMMONIA in the last 168  hours. Coagulation Profile: No results for input(s): INR, PROTIME in the last 168 hours. Cardiac Enzymes: No results for input(s): CKTOTAL, CKMB, CKMBINDEX, TROPONINI in the last 168 hours. BNP (last 3 results) No results for input(s): PROBNP in the last 8760 hours. HbA1C: No results for input(s): HGBA1C in the last 72 hours. CBG:  Recent Labs Lab 02/25/17 1148 02/25/17 1629 02/25/17 1957 02/25/17 2332 02/26/17 0337  GLUCAP 121* 159* 115* 106* 114*   Lipid Profile: No results for input(s): CHOL, HDL, LDLCALC, TRIG, CHOLHDL, LDLDIRECT in the last 72 hours. Thyroid Function Tests: No results for input(s): TSH, T4TOTAL, FREET4, T3FREE, THYROIDAB in the last 72 hours. Anemia Panel:  No results for input(s): VITAMINB12, FOLATE, FERRITIN, TIBC, IRON, RETICCTPCT in the last 72 hours. Urine analysis:    Component Value Date/Time   COLORURINE YELLOW 02/16/2017 1845   APPEARANCEUR CLEAR 02/16/2017 1845   LABSPEC 1.013 02/16/2017 1845   PHURINE 5.0 02/16/2017 1845   GLUCOSEU NEGATIVE 02/16/2017 1845   HGBUR NEGATIVE 02/16/2017 1845   BILIRUBINUR NEGATIVE 02/16/2017 1845   KETONESUR NEGATIVE 02/16/2017 1845   PROTEINUR NEGATIVE 02/16/2017 1845   NITRITE NEGATIVE 02/16/2017 1845   LEUKOCYTESUR NEGATIVE 02/16/2017 1845   Sepsis Labs: @LABRCNTIP (procalcitonin:4,lacticidven:4)   Recent Results (from the past 240 hour(s))  Culture, blood (routine x 2)     Status: Abnormal   Collection Time: 02/16/17  2:10 PM  Result Value Ref Range Status   Specimen Description BLOOD RIGHT ANTECUBITAL  Final   Special Requests   Final    BOTTLES DRAWN AEROBIC AND ANAEROBIC Blood Culture adequate volume   Culture  Setup Time   Final    GRAM POSITIVE COCCI IN CLUSTERS IN BOTH AEROBIC AND ANAEROBIC BOTTLES CRITICAL RESULT CALLED TO, READ BACK BY AND VERIFIED WITH: L. Cutten, [place] 02/17/2017 T. TYSOR    Culture (A)  Final    STAPHYLOCOCCUS SPECIES (COAGULASE NEGATIVE) THE SIGNIFICANCE  OF ISOLATING THIS ORGANISM FROM A SINGLE SET OF BLOOD CULTURES WHEN MULTIPLE SETS ARE DRAWN IS UNCERTAIN. PLEASE NOTIFY THE MICROBIOLOGY DEPARTMENT WITHIN ONE WEEK IF SPECIATION AND SENSITIVITIES ARE REQUIRED. Performed at [facility], [place] [address], [place], [place] [address]    Report Status 02/19/2017 FINAL  Final  Culture, blood (routine x 2)     Status: None   Collection Time: 02/16/17  2:21 PM  Result Value Ref Range Status   Specimen Description BLOOD LEFT ANTECUBITAL  Final   Special Requests   Final    BOTTLES DRAWN AEROBIC AND ANAEROBIC Blood Culture results may not be optimal due to an inadequate volume of blood received in culture bottles   Culture   Final    NO GROWTH 5 DAYS Performed at [facility], [place] [address], [place], [place] [address]    Report Status 02/21/2017 FINAL  Final  Blood Culture ID Panel (Reflexed)     Status: Abnormal   Collection Time: 02/16/17  2:21 PM  Result Value Ref Range Status   Enterococcus species NOT DETECTED NOT DETECTED Final   Listeria monocytogenes NOT DETECTED NOT DETECTED Final   Staphylococcus species DETECTED (A) NOT DETECTED Final    Comment: Methicillin (oxacillin) resistant coagulase negative staphylococcus. Possible blood culture contaminant (unless isolated from more than one blood culture draw or clinical case suggests pathogenicity). No antibiotic treatment is indicated for blood  culture contaminants. CRITICAL RESULT CALLED TO, READ BACK BY AND VERIFIED WITH: L. POINDEXTER, [place] 02/17/2017 T. TYSOR    Staphylococcus aureus NOT DETECTED NOT DETECTED Final   Methicillin resistance DETECTED (A) NOT DETECTED Final    Comment: CRITICAL RESULT CALLED TO, READ BACK BY AND VERIFIED WITH: L. POINDEXTER, [place] 02/17/2017 T. TYSOR    Streptococcus species NOT DETECTED NOT DETECTED Final   Streptococcus agalactiae NOT DETECTED NOT DETECTED Final   Streptococcus pneumoniae NOT DETECTED NOT DETECTED Final    Streptococcus pyogenes NOT DETECTED NOT DETECTED Final   Acinetobacter baumannii NOT DETECTED NOT DETECTED Final   Enterobacteriaceae species NOT DETECTED NOT DETECTED Final   Enterobacter cloacae complex NOT DETECTED NOT DETECTED Final   Escherichia coli NOT DETECTED NOT DETECTED Final   Klebsiella oxytoca NOT DETECTED  NOT DETECTED Final   Klebsiella pneumoniae NOT DETECTED NOT DETECTED Final   Proteus species NOT DETECTED NOT DETECTED Final   Serratia marcescens NOT DETECTED NOT DETECTED Final   Haemophilus influenzae NOT DETECTED NOT DETECTED Final   Neisseria meningitidis NOT DETECTED NOT DETECTED Final   Pseudomonas aeruginosa NOT DETECTED NOT DETECTED Final   Candida albicans NOT DETECTED NOT DETECTED Final   Candida glabrata NOT DETECTED NOT DETECTED Final   Candida krusei NOT DETECTED NOT DETECTED Final   Candida parapsilosis NOT DETECTED NOT DETECTED Final   Candida tropicalis NOT DETECTED NOT DETECTED Final  MRSA PCR Screening     Status: None   Collection Time: 02/16/17  9:33 PM  Result Value Ref Range Status   MRSA by PCR NEGATIVE NEGATIVE Final    Comment:        The GeneXpert MRSA Assay (FDA approved for NASAL specimens only), is one component of a comprehensive MRSA colonization surveillance program. It is not intended to diagnose MRSA infection nor to guide or monitor treatment for MRSA infections.   Acid Fast Smear (AFB)     Status: None   Collection Time: 02/18/17 12:00 PM  Result Value Ref Range Status   AFB Specimen Processing Concentration  Final   Acid Fast Smear Negative  Final    Comment: (NOTE) Performed At: [facility] [address] [place], [place] [id number] Lindon Romp MD CZ:[id number]    Source (AFB) PERITONEAL  Final  Culture, body fluid-bottle     Status: None   Collection Time: 02/18/17 12:00 PM  Result Value Ref Range Status   Specimen Description PERITONEAL  Final   Special Requests NONE  Final   Culture   Final    NO  GROWTH 5 DAYS Performed at [facility], [address] [place], [place] [address]    Report Status 02/23/2017 FINAL  Final  Gram stain     Status: None   Collection Time: 02/18/17 12:00 PM  Result Value Ref Range Status   Specimen Description PERITONEAL  Final   Special Requests NONE  Final   Gram Stain   Final    RARE WBC PRESENT, PREDOMINANTLY MONONUCLEAR NO ORGANISMS SEEN Performed at [facility], [place] [address] [place], [place] [address]    Report Status 02/18/2017 FINAL  Final      Radiology Studies: Dg Chest Port 1 View Result Date: 02/22/2017 Probable bibasilar atelectasis.    Scheduled Meds: . cephALEXin  500 mg Oral Q12H  . Chlorhexidine Gluconate Cloth  6 each Topical Daily  . feeding supplement (ENSURE ENLIVE)  237 mL Oral TID BM  . ferrous sulfate  325 mg Oral BID WC  . fluticasone  2 spray Each Nare Daily  . insulin aspart  0-15 Units Subcutaneous Q4H  . levothyroxine  100 mcg Oral QAC breakfast  . mouth rinse  15 mL Mouth Rinse BID  . metoprolol tartrate  12.5 mg Oral BID  . multivitamin with minerals  1 tablet Oral Daily  . pantoprazole  40 mg Oral BID  . senna  1 tablet Oral Daily  . sodium chloride flush  10-40 mL Intracatheter Q12H  . thiamine  100 mg Oral Daily   Continuous Infusions: . sodium chloride    . amiodarone 30 mg/hr (02/26/17 0600)  . furosemide (LASIX) infusion 10 mg/hr (02/26/17 0600)     LOS: 10 days    Time spent: 25 minutes  Greater than 50% of the time spent  on counseling and coordinating the care.   Leisa Lenz, MD [facility] Pager [phone]  If 7PM-7AM, please contact night-coverage [email or web address] Password [facility] 02/26/2017, 8:33 AM

## 2017-02-26 NOTE — Progress Notes (Signed)
Patient was found picking and peeling dead skin off her legs with her fingers and a butter knife stating it itches really badly. Some parts of her skin on her legs looks broken from constant itching and picking.  MD notified for anti-itch cream.

## 2017-02-26 NOTE — Progress Notes (Signed)
   Case discussed with Dr.Nahser. Chart reviewed.  56 y/o woman with morbid obesity and little previous medical follow-up.  Admitted 6/24 with severe anemia (hgb 3.7) and cardiogenic shock due biventricular HF R>L.  Echo reviewed EF 30-35% with severe PAH.   Has anasarca and progressive renal failure with inability to mobilize fluid.   Agree with transfer to [facility]. The HF team will see on arrival.   Glori Bickers, MD  11:17 AM

## 2017-02-27 ENCOUNTER — Inpatient Hospital Stay (HOSPITAL_COMMUNITY): Payer: Self-pay

## 2017-02-27 DIAGNOSIS — N179 Acute kidney failure, unspecified: Secondary | ICD-10-CM

## 2017-02-27 LAB — COOXEMETRY PANEL
CARBOXYHEMOGLOBIN: 1.7 % — AB (ref 0.5–1.5)
CARBOXYHEMOGLOBIN: 1.5 % (ref 0.5–1.5)
METHEMOGLOBIN: 1.7 % — AB (ref 0.0–1.5)
Methemoglobin: 1.6 % — ABNORMAL HIGH (ref 0.0–1.5)
O2 SAT: 76.8 %
O2 Saturation: 69.5 %
TOTAL HEMOGLOBIN: 7.5 g/dL — AB (ref 12.0–16.0)
Total hemoglobin: 7.9 g/dL — ABNORMAL LOW (ref 12.0–16.0)

## 2017-02-27 LAB — GLUCOSE, CAPILLARY
GLUCOSE-CAPILLARY: 94 mg/dL (ref 65–99)
GLUCOSE-CAPILLARY: 126 mg/dL — AB (ref 65–99)
GLUCOSE-CAPILLARY: 109 mg/dL — AB (ref 65–99)
Glucose-Capillary: 101 mg/dL — ABNORMAL HIGH (ref 65–99)
Glucose-Capillary: 108 mg/dL — ABNORMAL HIGH (ref 65–99)
Glucose-Capillary: 115 mg/dL — ABNORMAL HIGH (ref 65–99)
Glucose-Capillary: 117 mg/dL — ABNORMAL HIGH (ref 65–99)
Glucose-Capillary: 134 mg/dL — ABNORMAL HIGH (ref 65–99)

## 2017-02-27 LAB — BASIC METABOLIC PANEL
Anion gap: 8 (ref 5–15)
BUN: 54 mg/dL — AB (ref 6–20)
CALCIUM: 8.6 mg/dL — AB (ref 8.9–10.3)
CHLORIDE: 108 mmol/L (ref 101–111)
CO2: 21 mmol/L — AB (ref 22–32)
CREATININE: 2.11 mg/dL — AB (ref 0.44–1.00)
GFR calc Af Amer: 30 mL/min — ABNORMAL LOW (ref >60)
GFR calc non Af Amer: 26 mL/min — ABNORMAL LOW (ref >60)
GLUCOSE: 110 mg/dL — AB (ref 65–99)
Potassium: 3.2 mmol/L — ABNORMAL LOW (ref 3.5–5.1)
Sodium: 137 mmol/L (ref 135–145)

## 2017-02-27 LAB — HEPATITIS PANEL, ACUTE
Hep A IgM: NEGATIVE
Hep B C IgM: NEGATIVE
Hepatitis B Surface Ag: NEGATIVE

## 2017-02-27 LAB — RHEUMATOID FACTOR: Rhuematoid fact SerPl-aCnc: <10 IU/mL (ref 0.0–13.9)

## 2017-02-27 LAB — URINALYSIS, ROUTINE W REFLEX MICROSCOPIC
BILIRUBIN URINE: NEGATIVE
Glucose, UA: NEGATIVE mg/dL
KETONES UR: NEGATIVE mg/dL
Nitrite: NEGATIVE
Protein, ur: 30 mg/dL — AB
SPECIFIC GRAVITY, URINE: 1.02 (ref 1.005–1.030)
pH: 5 (ref 5.0–8.0)

## 2017-02-27 LAB — MAGNESIUM: Magnesium: 2.1 mg/dL (ref 1.7–2.4)

## 2017-02-27 LAB — CBC
HCT: 24.5 % — ABNORMAL LOW (ref 36.0–46.0)
HEMOGLOBIN: 7.3 g/dL — AB (ref 12.0–15.0)
MCH: 22.5 pg — AB (ref 26.0–34.0)
MCHC: 29.8 g/dL — ABNORMAL LOW (ref 30.0–36.0)
MCV: 75.6 fL — AB (ref 78.0–100.0)
Platelets: 271 10*3/uL (ref 150–400)
RBC: 3.24 MIL/uL — AB (ref 3.87–5.11)
RDW: 30.6 % — ABNORMAL HIGH (ref 11.5–15.5)
WBC: 22 10*3/uL — AB (ref 4.0–10.5)

## 2017-02-27 LAB — T3, FREE: T3 FREE: 1.3 pg/mL — AB (ref 2.0–4.4)

## 2017-02-27 MED ORDER — FERROUS SULFATE 325 (65 FE) MG PO TABS
325.0000 mg | ORAL_TABLET | Freq: Every day | ORAL | Status: DC
Start: 2017-02-28 — End: 2017-03-05
  Administered 2017-02-28 – 2017-03-04 (×5): 325 mg via ORAL
  Filled 2017-02-27 (×5): qty 1

## 2017-02-27 MED ORDER — PRO-STAT SUGAR FREE PO LIQD
30.0000 mL | Freq: Two times a day (BID) | ORAL | Status: DC
  Administered 2017-02-27 – 2017-03-03 (×6): 30 mL via ORAL
  Filled 2017-02-27 (×8): qty 30

## 2017-02-27 MED ORDER — POTASSIUM CHLORIDE CRYS ER 20 MEQ PO TBCR
60.0000 meq | EXTENDED_RELEASE_TABLET | Freq: Once | ORAL | Status: DC
  Filled 2017-02-27: qty 3

## 2017-02-27 MED ORDER — DOPAMINE-DEXTROSE 3.2-5 MG/ML-% IV SOLN
0.0000 ug/kg/min | INTRAVENOUS | Status: DC
  Administered 2017-02-27: 2.5 ug/kg/min via INTRAVENOUS
  Administered 2017-02-27: 3 ug/kg/min via INTRAVENOUS
  Filled 2017-02-27: qty 250

## 2017-02-27 MED ORDER — SENNOSIDES-DOCUSATE SODIUM 8.6-50 MG PO TABS
1.0000 | ORAL_TABLET | Freq: Two times a day (BID) | ORAL | Status: DC
  Administered 2017-02-27 – 2017-03-05 (×10): 1 via ORAL
  Filled 2017-02-27 (×12): qty 1

## 2017-02-27 MED ORDER — DOPAMINE-DEXTROSE 3.2-5 MG/ML-% IV SOLN
0.0000 ug/kg/min | INTRAVENOUS | Status: DC
  Administered 2017-02-28: 9 ug/kg/min via INTRAVENOUS
  Administered 2017-02-28: 10 ug/kg/min via INTRAVENOUS
  Administered 2017-03-01 – 2017-03-03 (×3): 6 ug/kg/min via INTRAVENOUS
  Administered 2017-03-04: 7 ug/kg/min via INTRAVENOUS
  Filled 2017-02-27 (×6): qty 250

## 2017-02-27 MED ORDER — ENSURE ENLIVE PO LIQD
237.0000 mL | Freq: Two times a day (BID) | ORAL | Status: DC
  Administered 2017-03-03 – 2017-03-08 (×2): 237 mL via ORAL

## 2017-02-27 NOTE — Progress Notes (Signed)
Nutrition Follow-up  DOCUMENTATION CODES:   Obesity unspecified  INTERVENTION:   -Decrease Ensure Enlive po to BID, each supplement provides 350 kcal and 20 grams of protein -30 ml Prostat BID, each supplement provides 100 kcals and 15 grams protein -MVI daily  NUTRITION DIAGNOSIS:   Inadequate oral intake related to acute illness, poor appetite as evidenced by per patient/family report.  Ongoing  GOAL:   Patient will meet greater than or equal to 90% of their needs  Progressing  MONITOR:   PO intake, Supplement acceptance, Weight trends, Labs, Skin, I & O's  REASON FOR ASSESSMENT:   Consult  (Suspected malnutrition)  ASSESSMENT:   56 year old female with PMH of Obesity presents to ED on 6/24 with complaints of weakness and pain for the last 3 days. 2 weeks prior had a mechanical fall which resulted in right knee pain. Patient reports that she has not had anything to eat or drink as she has been laying in bed for 3 days unable to move, reports she lives with a roommate however they refused to help. EMS arrived to house on 6/21 and found patient laying face down on an air-mattress, however refused help. EMS reports that living situation was poor and house was in un-livable conditions.  Pt transferred to [facility] on 02/26/13 due to need for milirone drip. Per Advance Heart Failure team notes, pt with anasarca with progressive renal failure with inability to mobilize fluid. Noted +4.2 L since admission.   Pt with MASD/ partial thickness wounds on buttocks, likely related to moisture and incontinence. CWOCN evaluation pending.   Pt was sleeping soundly and snoring audibly at time of visit. She did not arouse when name was called. Meal intake continues to be poor, noted pt consistently consumes only 25-30% of meals. Pt consumed 100% of strawberry Ensure supplement, however, per MAR, only accepts Ensure about 50% of when it is offered (on average 1-2 times daily).   Labs reviewed:  CBGS: 136-167, K: 3.2.   Diet Order:  Diet Heart Room service appropriate? Yes; Fluid consistency: Thin; Fluid restriction: 2000 mL Fluid  Skin:  Wound (see comment) (Stage 2 bilateral buttocks pressure injuries)  Last BM:  02/25/17  Height:   Ht Readings from Last 1 Encounters:  02/16/17 5\' 5"  (1.651 m)    Weight:   Wt Readings from Last 1 Encounters:  02/26/17 261 lb 7.5 oz (118.6 kg)    Ideal Body Weight:  56.82 kg  BMI:  Body mass index is 43.51 kg/m.  Estimated Nutritional Needs:   Kcal:  1850-2050  Protein:  95-110 grams  Fluid:  per MD  EDUCATION NEEDS:   Education needs addressed  Haris Baack A. Jimmye Norman, RD, LDN, CDE Pager: [phone] After hours Pager: [phone]

## 2017-02-27 NOTE — Progress Notes (Addendum)
Patient ID: Samantha Richards, female   DOB: Mar 18, 1961, 56 y.o.   MRN: [id number]  PROGRESS NOTE    Samantha Richards  XHB:[id number] DOB: [birth year] DOA: 02/16/2017  PCP: Patient, No Pcp Per   Brief Narrative:  56 y.o. female who presented on 6/24 after a fall at home found to be in hemorrhagic/hypovolemic and cardiogenic shock, severely anemic in the setting of slow GI bleed and Hgb 3.7. Transfusions and BiPAP provided in addition to vancomycin/ceftriaxone for cellulitis of the back. EGD 6/28 showed gastritis and esophagitis, and pt declined colonoscopy. Levophed was weaned 6/28 and the patient was transferred to hospitalist service 6/29. AFib with RVR has been managed by cardiology, and has converted to sinus rhythm. EF 30% with evidence of RV overload/cor pulmonale. She appears severely malnourished with anasarca with ascites, paracentesis 6/26 of 2.5L fluid with negative cytology and culture. Lasix infusion was started 7/2.  Per cardiology assessment, pt may benefit from milrinone drip and transferred to [facility] to be seen by Heart Failure Team.   Assessment & Plan:   Cardiogenic shock with acute systolic and diastolic CHF and RV overload and cor pulmonale - Off pressor support since 6/28 - Cortisol 25.8 - ECHO showed RV overload, diffusely hypokinetic and LVEF 30-35% - Pt has ongoing diffuse anasarca - Started on milrinone drip per HF team - Plan to transfer to [facility] for continue HF management   Acute blood loss anemia / Acute upper GI bleed / hematemesis / Iron deficiency anemia - Symptomatic anemia with hgb of 3.7 on admission - Occult negative on admission - EGD 6/28 showed gastritis / esophagitis - GI signed off 6/29, recommended to call back when pt more stable and willing to undergo colonoscopy  - Continue Protonix 40 mg BID - Continue ferrous sulfate supplementation   Ascites - SAAG > 1.1 - Negative SBP - S/P paracentesis   Hypokalemia - replace orally, check magnesium  CKD  stage 3 - Cr continues to trend up, likely in the setting of IV lasix  - Per cardio, plan to switch to milrinone drip   Generalized weakness - In the setting of acute decompensated CHF - PT recommended SNF placement  Atrial fibrillation with RVR - CHADS vasc score at least 3 - On amiodarone drip, metoprolol 12.5 mg PO BID - Cardiology following - Not on [facility] due to risk of GI bleed   Cellulitis, right lower extremity / Leukocytosis  - Currently on cephalexin, started 7/2, to complete for total of 10 days - Vanco and rocephin stopped  - check portable CXR - check urinalysis   Hypothyroidism - TSH 11.7 - Started levothyroxine   Obesity / Severe protein calorie malnutrition  - Counseled on diet - Seen by nutritionist   Stage 2 pressure skin injury to bilateral buttocks and right lower extremity  - Seen by wound care, appreciate their assessment - Bilateral buttocks 18 cm x 30 cm with partial thickness tissue loss - Right lateral and posterior foot with 15 cm x 15 cm area of maceration with serous exudate - Added bariatric mattress, moisture barrier ointment   DVT prophylaxis: heparin Code Status: full code  Family Communication: no family at the bedside this am Disposition Plan: TBD    Consultants:   Cardiology, HF team  GI, [facility]   Pulmonary / CCM   Nutrition  PT - SNF recommended   WOC  Procedures:   Paracentesis 6/26 - 2.5 L fluid removed   B/ LE venous doppler 6/25 - no DVT  EGD 6/28 - distal esophagitis and gastritis   Antimicrobials:   Vanco 6/24 --> 6/28  Rocephin 6/24 --> 7/2  Cephalexin 7/2 -->  Subjective: Pt transferred to [facility], started on IV milrinone, Pt says she doesn't feel the greatest but is breathing better, still with leg pain from dependent edema   Objective: Vitals:   02/26/17 1713 02/26/17 2034 02/27/17 0017 02/27/17 0417  BP: (!) 86/64 (!) 91/58 (!) 88/45 (!) 90/49  Pulse: 86 87 98 96  Resp: 20 14 (!) 23 (!) 24    Temp: (!) 97.5 F (36.4 C) 98.7 F (37.1 C) (!) 97.4 F (36.3 C) 97.9 F (36.6 C)  TempSrc: Oral Oral Oral Oral  SpO2: 100% 99% 95% 94%  Weight:      Height:        Intake/Output Summary (Last 24 hours) at 02/27/17 0729 Last data filed at 02/27/17 0600  Gross per 24 hour  Intake           1456.5 ml  Output             1530 ml  Net            -73.5 ml   Filed Weights   02/25/17 0500 02/25/17 1700 02/26/17 0500  Weight: 118.1 kg (260 lb 5.8 oz) 116.7 kg (257 lb 4.4 oz) 118.6 kg (261 lb 7.5 oz)    Examination:  General exam: Appears calm and comfortable, NAD.  Respiratory system: Diminished breath sounds bilateral with bibasilar crackles, no wheezing  Cardiovascular system: irregular, S1 & S2 heard, Rate controlled, (+) JVD Gastrointestinal system: Abdomen is distended, soft and nontender. No organomegaly or masses felt. Normal bowel sounds heard. Central nervous system: Alert and oriented. No focal neurological deficits. Extremities: 2+ pitting edema B/L LE Skin: Right LE wound scaling open lesions cracked skin BLEs.  Psychiatry: normal affect.  Data Reviewed: I have personally reviewed following labs and imaging studies  CBC:  Recent Labs Lab 02/23/17 0308 02/24/17 0527 02/25/17 0305 02/26/17 0330 02/27/17 0309  WBC 23.3* 23.2* 21.0* 22.9* 22.0*  HGB 8.4* 8.4* 7.9* 8.1* 7.3*  HCT 27.5* 27.2* 26.9* 26.1* 24.5*  MCV 74.3* 74.9* 74.1* 73.9* 75.6*  PLT 234 278 267 299 626   Basic Metabolic Panel:  Recent Labs Lab 02/23/17 0308 02/24/17 0527 02/25/17 0305 02/26/17 0330 02/27/17 0313  NA 141 137 139 138 137  K 4.0 4.0 4.1 3.6 3.2*  CL 112* 109 110 107 108  CO2 19* 19* 20* 20* 21*  GLUCOSE 140* 98 104* 120* 110*  BUN 57* 54* 56* 56* 54*  CREATININE 1.76* 1.85* 1.88* 2.03* 2.11*  CALCIUM 8.7* 8.7* 8.7* 8.8* 8.6*   GFR: Estimated Creatinine Clearance: 39.7 mL/min (A) (by C-G formula based on SCr of 2.11 mg/dL (H)). Liver Function Tests: No results for  input(s): AST, ALT, ALKPHOS, BILITOT, PROT, ALBUMIN in the last 168 hours. No results for input(s): LIPASE, AMYLASE in the last 168 hours. No results for input(s): AMMONIA in the last 168 hours. Coagulation Profile: No results for input(s): INR, PROTIME in the last 168 hours. Cardiac Enzymes: No results for input(s): CKTOTAL, CKMB, CKMBINDEX, TROPONINI in the last 168 hours. BNP (last 3 results) No results for input(s): PROBNP in the last 8760 hours. HbA1C: No results for input(s): HGBA1C in the last 72 hours. CBG:  Recent Labs Lab 02/26/17 1255 02/26/17 1721 02/26/17 2032 02/27/17 0013 02/27/17 0413  GLUCAP 151* 102* 102* 134* 101*   Lipid Profile: No results for input(s):  CHOL, HDL, LDLCALC, TRIG, CHOLHDL, LDLDIRECT in the last 72 hours. Thyroid Function Tests:  Recent Labs  02/26/17 1831  FREET4 1.08  T3FREE 1.3*   Anemia Panel: No results for input(s): VITAMINB12, FOLATE, FERRITIN, TIBC, IRON, RETICCTPCT in the last 72 hours. Urine analysis:    Component Value Date/Time   COLORURINE YELLOW 02/16/2017 1845   APPEARANCEUR CLEAR 02/16/2017 1845   LABSPEC 1.013 02/16/2017 1845   PHURINE 5.0 02/16/2017 1845   GLUCOSEU NEGATIVE 02/16/2017 1845   HGBUR NEGATIVE 02/16/2017 1845   BILIRUBINUR NEGATIVE 02/16/2017 1845   KETONESUR NEGATIVE 02/16/2017 1845   PROTEINUR NEGATIVE 02/16/2017 1845   NITRITE NEGATIVE 02/16/2017 1845   LEUKOCYTESUR NEGATIVE 02/16/2017 1845    Recent Results (from the past 240 hour(s))  Acid Fast Smear (AFB)     Status: None   Collection Time: 02/18/17 12:00 PM  Result Value Ref Range Status   AFB Specimen Processing Concentration  Final   Acid Fast Smear Negative  Final    Comment: (NOTE) Performed At: [facility] [address] [place], [place] [id number] Lindon Romp MD BL:[id number]    Source (AFB) PERITONEAL  Final  Culture, body fluid-bottle     Status: None   Collection Time: 02/18/17 12:00 PM  Result Value Ref  Range Status   Specimen Description PERITONEAL  Final   Special Requests NONE  Final   Culture   Final    NO GROWTH 5 DAYS Performed at [facility], [place] [address] [place], [place] [address]    Report Status 02/23/2017 FINAL  Final  Gram stain     Status: None   Collection Time: 02/18/17 12:00 PM  Result Value Ref Range Status   Specimen Description PERITONEAL  Final   Special Requests NONE  Final   Gram Stain   Final    RARE WBC PRESENT, PREDOMINANTLY MONONUCLEAR NO ORGANISMS SEEN Performed at [facility], [place] [address] [place], [place] [address]    Report Status 02/18/2017 FINAL  Final    Radiology Studies: Dg Chest Port 1 View Result Date: 02/22/2017 Probable bibasilar atelectasis.   Scheduled Meds: . Chlorhexidine Gluconate Cloth  6 each Topical Daily  . feeding supplement (ENSURE ENLIVE)  237 mL Oral TID BM  . ferrous sulfate  325 mg Oral BID WC  . fluticasone  2 spray Each Nare Daily  . heparin  5,000 Units Subcutaneous Q8H  . insulin aspart  0-15 Units Subcutaneous Q4H  . levothyroxine  100 mcg Oral QAC breakfast  . mouth rinse  15 mL Mouth Rinse BID  . multivitamin with minerals  1 tablet Oral Daily  . pantoprazole  40 mg Oral BID  . senna  1 tablet Oral Daily  . sodium chloride flush  10-40 mL Intracatheter Q12H  . thiamine  100 mg Oral Daily   Continuous Infusions: . sodium chloride    . amiodarone 30 mg/hr (02/26/17 2042)  . furosemide (LASIX) infusion 10 mg/hr (02/26/17 1900)  . milrinone 0.25 mcg/kg/min (02/27/17 0156)     LOS: 11 days    Critical Care Time spent:38 minutes  Greater than 50% of the time spent on counseling and coordinating the care.   Irwin Brakeman, MD [facility] Pager [phone]  If 7PM-7AM, please contact night-coverage [email or web address] Password [email or web address] 02/27/2017, 7:29 AM

## 2017-02-27 NOTE — Progress Notes (Signed)
PT Cancellation Note  Patient Details Name: Tarisha Fader MRN: [id number] DOB: 09-09-1960   Cancelled Treatment:    Reason Eval/Treat Not Completed: Medical issues which prohibited therapy; patient too nauseated to participate and not feeling well despite having Zofran earlier.  Will attempt another day.   Reginia Naas 02/27/2017, 1:22 PM  [place], [place] 02/27/2017

## 2017-02-27 NOTE — H&P (Signed)
PULMONARY / CRITICAL CARE MEDICINE   Name: Samantha Richards MRN: [id number] DOB: 10/04/1960    ADMISSION DATE:  02/16/2017 CONSULTATION DATE:  02/27/17  REFERRING MD:  Dr. Wynetta Emery  CHIEF COMPLAINT:  Hypotension  HISTORY OF PRESENT ILLNESS:   Samantha Richards is a 56 y.o. female who presented to [facility] on 6/24 after a fall at home found to be in hemorrhagic/hypovolemic and cardiogenic shock. She was severely anemic to Hgb 3.7 in the setting of GI bleed. Requiring transfusions, pressors and BiPAP in ICU. She was also treated for cellulitis of the back. Levophed was weaned 6/28 and the patient was transferred to hospitalist service 6/29. Cardiology has been managing AFib with RVR with amiodarone gtt and biventricular heart failure R>L with lasix gtt. She had cardiogenic shock prompting transfer to [facility] on 7/4 for further evaluation by CHF team and initiation of milrinone and dopamine gtt. She also has anasarca and progressive renal failure with oliguria and hypotension.   PAST MEDICAL HISTORY :  Obesity, no other known PMH d/t limited medical follow up  PAST SURGICAL HISTORY: She  has a past surgical history that includes Esophagogastroduodenoscopy (egd) with propofol (N/A, 02/20/2017).  Allergies  Allergen Reactions  . Gentamicin Itching and Rash    No current facility-administered medications on file prior to encounter.    No current outpatient prescriptions on file prior to encounter.    FAMILY HISTORY:  Her indicated that her mother is deceased. She indicated that her father is deceased. She indicated that her sister is alive.    SOCIAL HISTORY: She  reports that she has never smoked. She has never used smokeless tobacco. She reports that she does not drink alcohol or use drugs.  REVIEW OF SYSTEMS:   No fever/chills. + SOB, orthopnea, nausea, vomiting. No chest pain or palpitations.   SUBJECTIVE:  Complaining of buttock pain and chest heaviness/dyspnea.  VITAL SIGNS: BP (!)  87/52   Pulse (!) 109   Temp 98 F (36.7 C) (Oral)   Resp 15   Ht 5\' 5"  (1.651 m)   Wt 118.6 kg (261 lb 7.5 oz)   LMP 01/16/2017 Comment: neg preg 02-16-2017  SpO2 94%   BMI 43.51 kg/m   HEMODYNAMICS: CVP:  [13 mmHg-27 mmHg] 27 mmHg  VENTILATOR SETTINGS:    INTAKE / OUTPUT: I/O last 3 completed shifts: In: 2168.3 [P.O.:890; I.V.:1278.3] Out: 1605 [ZSWFU:9323]  PHYSICAL EXAMINATION: General:  Chronic ill appearing woman, laying in bed. Appears uncomfortable. Neuro:  Alert, awake intermittently but having difficulty keeping eyes open. Able to converse and oriented. HEENT:  [place], AT.  Cardiovascular:  Tachycardic, normal s1 and s2. No murmurs. + JVD Lungs:  Poor air movement in bases, no rhonchi with anterior auscultation Abdomen:  Obese, soft, nontender, pitting edema over abdomen, + bowel sounds Musculoskeletal:  3+ pitting edema  Skin:  Chronic skin changes in b/l LE  LABS:  BMET  Recent Labs Lab 02/25/17 0305 02/26/17 0330 02/27/17 0313  NA 139 138 137  K 4.1 3.6 3.2*  CL 110 107 108  CO2 20* 20* 21*  BUN 56* 56* 54*  CREATININE 1.88* 2.03* 2.11*  GLUCOSE 104* 120* 110*    Electrolytes  Recent Labs Lab 02/25/17 0305 02/26/17 0330 02/27/17 0313  CALCIUM 8.7* 8.8* 8.6*  MG  --   --  2.1    CBC  Recent Labs Lab 02/25/17 0305 02/26/17 0330 02/27/17 0309  WBC 21.0* 22.9* 22.0*  HGB 7.9* 8.1* 7.3*  HCT 26.9* 26.1* 24.5*  PLT 267 299 271    Coag's No results for input(s): APTT, INR in the last 168 hours.  Sepsis Markers  Recent Labs Lab 02/22/17 1200 02/23/17 0308 02/24/17 0527  PROCALCITON 0.44 0.35 0.35    ABG No results for input(s): PHART, PCO2ART, PO2ART in the last 168 hours.  Liver Enzymes No results for input(s): AST, ALT, ALKPHOS, BILITOT, ALBUMIN in the last 168 hours.  Cardiac Enzymes No results for input(s): TROPONINI, PROBNP in the last 168 hours.  Glucose  Recent Labs Lab 02/27/17 0413 02/27/17 0931  02/27/17 1229 02/27/17 1341 02/27/17 1657 02/27/17 2232  GLUCAP 101* 94 108* 115* 126* 117*    Imaging Dg Chest Port 1 View  Result Date: 02/27/2017 CLINICAL DATA:  Leukocytosis EXAM: PORTABLE CHEST 1 VIEW COMPARISON:  02/22/2017 FINDINGS: Left central line coils in the mediastinum, possibly within the azygos vein. Cardiomegaly with vascular congestion. Low lung volumes. Left lower lobe atelectasis or infiltrate. Right midlung atelectasis. No visible effusions. IMPRESSION: Cardiomegaly with vascular congestion. Right midlung atelectasis with left lower lobe airspace opacity which could reflect atelectasis or infiltrate. Left central line possibly within the azygos vein. Electronically Signed   By: Rolm Baptise M.D.   On: 02/27/2017 08:57     STUDIES:  XR right Knee 6/24 > No Acute CT abd/pelvis 6/25 > anasarca with b/l pleural effusions, ascities. Fibroid uterus. Cardiomegaly with severe R atrial dilation. TTE 6/25 > EF30-35%, diffuse hypokinesis, LA dilated, mild MR, RA mod dilated with signs of significant cor pulmonale. LE venous doppler 6/25 > no DVT or SVT b/l US Paracentesis 6/26 > 2.5 L peritoneal fluid yielded, cytology negative EGD 6/28 > intense distal esophagitis with focal hemorrhagic hemorrhage, small hiatal hernia CXR 7/5 > Cardiomegaly with vascular congestion, R midlung atelectasis with LLL opacity.   CULTURES: MRSA PCR neg 6/24 Blood cultures 6/24 > 1 of 2 coag neg staph, likely contaminate Peritoneal fluid culture 6/26 > No growth 5 days, neg for AFB HIV 6/29 > negative  ANTIBIOTICS: Vancomycin 6/24 > 6/28 Rocephin 6/25 > 7/2 Keflex 7/2 > 7/4  SIGNIFICANT EVENTS: 6/24  Presents to [facility], admitted to ICU  6/29  Off vasopressors, transferred out to SDU 7/02  Lasix gtt initiated 7/04  Transferred to [facility]. Milrinone started 7/05  Dopamine started, transfer to ICU  LINES/TUBES: L IJ CVL 6/26 >> Foley 6/24 >>  DISCUSSION: 56 y.o. female who presented after a  fall at home found to be in hemorrhagic/hypovolemic with severe anemia and cardiogenic shock due to biventricular heart failure. Now with worsening anasarca and progressive renal failure with oliguria and hypotension.  ASSESSMENT / PLAN:  PULMONARY A: Chest congestion Probable sleep apnea, intolerant of CPAP in hospital P:   ABG now for evaluate for acidosis O2 to support sats > 95% Pulmonary hygiene- mobilize, IS  CARDIOVASCULAR A:  Acute systolic heart failure with RV overload/cor pulmonale  Afib with RVR, improved Cardiogenic shock  P:  Cardiology and CHF team following Tele monitoring  CVP Q Shift  Keep MAP > 65 Continue amiodarone, lasix, milrinone, dopamine gtt Fluid / Na restriction Will likely need R +/- L heart cath this admission  RENAL A:   Acute kidney injury, worsening Oliguria P:   Trend BMET and UOP Replete electrolytes as indicated Avoid nephrotoxic agents Renal consult in am  GASTROINTESTINAL A:   Upper GI bleed on EGD Severe protein-calorie malnutrition with kwashiokor Hepatic steatosis Ascites > cytology negative Peritoneal mets vs uterine fibroids P:   PPI BID  Nutrition following, continue supplements  HEMATOLOGIC A:   Anemia, likely multifactorial from GI bleed and chronic malnutrition P:  Trend CBC  Transfuse for Hgb <7 Continue ferrous sulfate Holding SQ heparin Peripheral smear pending  INFECTIOUS A:   Skin breakdown/wounds Cellulitis P:   Wound care Monitor skin break down on legs from severe anasarca  ENDOCRINE A:   Mild hyperglycemia Hypothyroid P:   Synthroid  Follow up TSH in 6 weeks > around 8/1 SSI   NEUROLOGIC A:   Deconditioning P:   PT consulted   FAMILY  - Updates: No family at bedside    Bufford Lope, DO PGY-2, [facility] 02/28/2017 12:52 AM

## 2017-02-27 NOTE — Progress Notes (Signed)
Progress Note  Patient Name: Samantha Richards Date of Encounter: 02/27/2017  Primary Cardiologist: None  Subjective   Transferred by Dr Acie Fredrickson yesterday for Milrinone Appreciate Dr Jeffie Pollock and CHF team seeing   Inpatient Medications    Scheduled Meds: . Chlorhexidine Gluconate Cloth  6 each Topical Daily  . feeding supplement (ENSURE ENLIVE)  237 mL Oral TID BM  . [START ON 02/28/2017] ferrous sulfate  325 mg Oral Q breakfast  . fluticasone  2 spray Each Nare Daily  . heparin  5,000 Units Subcutaneous Q8H  . insulin aspart  0-15 Units Subcutaneous Q4H  . levothyroxine  100 mcg Oral QAC breakfast  . mouth rinse  15 mL Mouth Rinse BID  . multivitamin with minerals  1 tablet Oral Daily  . pantoprazole  40 mg Oral BID  . potassium chloride  60 mEq Oral Once  . senna-docusate  1 tablet Oral BID  . sodium chloride flush  10-40 mL Intracatheter Q12H  . thiamine  100 mg Oral Daily   Continuous Infusions: . sodium chloride    . amiodarone 30 mg/hr (02/27/17 0807)  . furosemide (LASIX) infusion 10 mg/hr (02/26/17 1900)  . milrinone 0.25 mcg/kg/min (02/27/17 0156)   PRN Meds: sodium chloride, acetaminophen, alum & mag hydroxide-simeth, camphor-menthol, guaiFENesin-dextromethorphan, iopamidol, lip balm, ondansetron (ZOFRAN) IV, promethazine, sodium chloride flush   Vital Signs    Vitals:   02/26/17 1713 02/26/17 2034 02/27/17 0017 02/27/17 0417  BP: (!) 86/64 (!) 91/58 (!) 88/45 (!) 90/49  Pulse: 86 87 98 96  Resp: 20 14 (!) 23 (!) 24  Temp: (!) 97.5 F (36.4 C) 98.7 F (37.1 C) (!) 97.4 F (36.3 C) 97.9 F (36.6 C)  TempSrc: Oral Oral Oral Oral  SpO2: 100% 99% 95% 94%  Weight:      Height:        Intake/Output Summary (Last 24 hours) at 02/27/17 0827 Last data filed at 02/27/17 0600  Gross per 24 hour  Intake           1403.1 ml  Output             1180 ml  Net            223.1 ml   Filed Weights   02/25/17 0500 02/25/17 1700 02/26/17 0500  Weight: 260 lb 5.8 oz  (118.1 kg) 257 lb 4.4 oz (116.7 kg) 261 lb 7.5 oz (118.6 kg)    Telemetry    Atrial flutter rates in the 80's - Personally Reviewed  EKG 02/24/17: atrial flutter with 2:1 conduction at 100 bpm with low voltage QRS  Physical Exam   Affect appropriate Chronically ill obese black female  HEENT: normal Neck supple with no adenopathy JVP elevated,  no bruits no thyromegaly Lungs clear with no wheezing and good diaphragmatic motion Heart:  S1/S2 no murmur, no rub, gallop or click PMI normal Abdomen: edematous skin and ascites, tense  no bruit.  No HSM or HJR Distal pulses intact with no bruits 3+ edema to thighs  Neuro non-focal Skin warm and dry Both feet in boots/wrapped    Labs    Chemistry  Recent Labs Lab 02/25/17 0305 02/26/17 0330 02/27/17 0313  NA 139 138 137  K 4.1 3.6 3.2*  CL 110 107 108  CO2 20* 20* 21*  GLUCOSE 104* 120* 110*  BUN 56* 56* 54*  CREATININE 1.88* 2.03* 2.11*  CALCIUM 8.7* 8.8* 8.6*  GFRNONAA 29* 27* 26*  GFRAA 34* 31* 30*  ANIONGAP  9 11 8      Hematology  Recent Labs Lab 02/25/17 0305 02/26/17 0330 02/27/17 0309  WBC 21.0* 22.9* 22.0*  RBC 3.63* 3.53* 3.24*  HGB 7.9* 8.1* 7.3*  HCT 26.9* 26.1* 24.5*  MCV 74.1* 73.9* 75.6*  MCH 21.8* 22.9* 22.5*  MCHC 29.4* 31.0 29.8*  RDW 30.4* 30.8* 30.6*  PLT 267 299 271    Cardiac Enzymes No results for input(s): TROPONINI in the last 168 hours. No results for input(s): TROPIPOC in the last 168 hours.   BNP No results for input(s): BNP, PROBNP in the last 168 hours.   DDimer No results for input(s): DDIMER in the last 168 hours.   Radiology    No results found.  Cardiac Studies   Transthoracic Echo (02/17/2017) - Left ventricle: Septal flattening consistant with elevated RV pressures. Systolic function was moderately to severely reduced. The estimated ejection fraction was in the range of 30% to 35%. Diffuse hypokinesis. - Mitral valve: There was mild regurgitation. -  Left atrium: The atrium was mildly dilated. - Right ventricle: The cavity size was moderately dilated. - Right atrium: The atrium was moderately dilated. - Atrial septum: No defect or patent foramen ovale was identified. - Tricuspid valve: There was moderate-severe regurgitation. - Pericardium, extracardiac: A trivial pericardial effusion was identified. - Impressions: Suspect PA pressure underestimated by TR velocity due to RV dysfunction. There is moderate RV enlargement with severe hypokinesis and signs of significant cor pulmonale.  Impressions:  - Suspect PA pressure underestimated by TR velocity due to RV dysfunction. There is moderate RV enlargement with severe hypokinesis and signs of significant cor pulmonale  Patient Profile     Samantha Richards not sought medical care in years and does not have a known past medical history aside from obesity. She denies ETOH, drug use and is a never smoker.  She is being seen for the evaluation of atrial fibrillation with RVR and RV overload/cor pulmonaleat the request of Dr. Lake Bells, Critical Care.   Assessment & Plan    1.   CHF : Co-ox 69 today on milrinone and lasix drip appreciate CHF team seeing patient  2.OSA:  Will need sleep study at discharge, reports waking up and gasping for air.  3 Cor pulmonale: pt intolerant of CPAP, but willing to try again.  Will need outpatient sleep study. Continue to diurese.  5. Anemia: s/p transfusion: EGD 6/28 shows gastritis and esophagitis. Started on protonix. Hgb 7.3. Ferriheme stared   6. Atrial flutter w/ RVR: Is in rate controlled atrial flutter with 2:1 conduction. Continue amiodarone and beta blocker Only on subcutaneous heparin at this time Due to anemia  Jenkins Rouge, MD

## 2017-02-27 NOTE — Progress Notes (Signed)
Advanced Heart Failure Rounding Note  PCP:  Primary Cardiologist: Dr Acie Fredrickson   Subjective:    Yesterday started on milrinone and placed on lasix drip. Sluggish urine output. SBP soft. CVP 20. SOB moving in bed.   Complaining of dsyuria and buttock pain.     Objective:   Weight Range: 261 lb 7.5 oz (118.6 kg) Body mass index is 43.51 kg/m.   Vital Signs:   Temp:  [97.4 F (36.3 C)-98.7 F (37.1 C)] 97.7 F (36.5 C) (07/05 0943) Pulse Rate:  [83-100] 100 (07/05 0943) Resp:  [14-26] 24 (07/05 0943) BP: (79-91)/(45-74) 79/47 (07/05 0943) SpO2:  [92 %-100 %] 92 % (07/05 0943) Last BM Date: 02/25/17  Weight change: Filed Weights   02/25/17 0500 02/25/17 1700 02/26/17 0500  Weight: 260 lb 5.8 oz (118.1 kg) 257 lb 4.4 oz (116.7 kg) 261 lb 7.5 oz (118.6 kg)    Intake/Output:   Intake/Output Summary (Last 24 hours) at 02/27/17 0953 Last data filed at 02/27/17 0900  Gross per 24 hour  Intake           1192.1 ml  Output              980 ml  Net            212.1 ml      Physical Exam  CVP 20   General:  Chronically ill  appearing. No resp difficulty HEENT: Normal Neck: Supple. JVP to jaw . Carotids 2+ bilat; no bruits. No lymphadenopathy or thyromegaly appreciated. Cor: PMI nondisplaced. Tachy Regular rate & rhythm. No rubs, gallops or murmurs. Lungs: Clear Abdomen: obese, soft, nontender, nondistended. No hepatosplenomegaly. No bruits or masses. Good bowel sounds.  Extremities: No cyanosis, clubbing, rash, R and LLE 3+ edema Neuro: Alert & orientedx3, cranial nerves grossly intact. moves all 4 extremities w/o difficulty. Affect pleasant GU: Foley yellow urine. Leaking.  Skin: RLE partial thickness wound 3x1.5 cm 100% pink. Buttock multiple partial thickness wounds.    Telemetry   A flutter rate 100s. Personally reviewed.  EKG   A Flutter 100 bpm EKG 02/24/2017 Labs    CBC  Recent Labs  02/26/17 0330 02/27/17 0309  WBC 22.9* 22.0*  HGB 8.1* 7.3*  HCT  26.1* 24.5*  MCV 73.9* 75.6*  PLT 299 818   Basic Metabolic Panel  Recent Labs  02/26/17 0330 02/27/17 0313  NA 138 137  K 3.6 3.2*  CL 107 108  CO2 20* 21*  GLUCOSE 120* 110*  BUN 56* 54*  CREATININE 2.03* 2.11*  CALCIUM 8.8* 8.6*  MG  --  2.1   Liver Function Tests No results for input(s): AST, ALT, ALKPHOS, BILITOT, PROT, ALBUMIN in the last 72 hours. No results for input(s): LIPASE, AMYLASE in the last 72 hours. Cardiac Enzymes No results for input(s): CKTOTAL, CKMB, CKMBINDEX, TROPONINI in the last 72 hours.  BNP: BNP (last 3 results)  Recent Labs  02/16/17 1516  BNP 923.6*    ProBNP (last 3 results) No results for input(s): PROBNP in the last 8760 hours.   D-Dimer No results for input(s): DDIMER in the last 72 hours. Hemoglobin A1C No results for input(s): HGBA1C in the last 72 hours. Fasting Lipid Panel No results for input(s): CHOL, HDL, LDLCALC, TRIG, CHOLHDL, LDLDIRECT in the last 72 hours. Thyroid Function Tests  Recent Labs  02/26/17 1831  T3FREE 1.3*    Other results:   Imaging    Dg Chest Port 1 View  Result Date: 02/27/2017  CLINICAL DATA:  Leukocytosis EXAM: PORTABLE CHEST 1 VIEW COMPARISON:  02/22/2017 FINDINGS: Left central line coils in the mediastinum, possibly within the azygos vein. Cardiomegaly with vascular congestion. Low lung volumes. Left lower lobe atelectasis or infiltrate. Right midlung atelectasis. No visible effusions. IMPRESSION: Cardiomegaly with vascular congestion. Right midlung atelectasis with left lower lobe airspace opacity which could reflect atelectasis or infiltrate. Left central line possibly within the azygos vein. Electronically Signed   By: Rolm Baptise M.D.   On: 02/27/2017 08:57      Medications:     Scheduled Medications: . Chlorhexidine Gluconate Cloth  6 each Topical Daily  . feeding supplement (ENSURE ENLIVE)  237 mL Oral TID BM  . [START ON 02/28/2017] ferrous sulfate  325 mg Oral Q breakfast  .  fluticasone  2 spray Each Nare Daily  . heparin  5,000 Units Subcutaneous Q8H  . insulin aspart  0-15 Units Subcutaneous Q4H  . levothyroxine  100 mcg Oral QAC breakfast  . mouth rinse  15 mL Mouth Rinse BID  . multivitamin with minerals  1 tablet Oral Daily  . pantoprazole  40 mg Oral BID  . potassium chloride  60 mEq Oral Once  . senna-docusate  1 tablet Oral BID  . sodium chloride flush  10-40 mL Intracatheter Q12H  . thiamine  100 mg Oral Daily     Infusions: . sodium chloride    . amiodarone 30 mg/hr (02/27/17 0807)  . furosemide (LASIX) infusion 10 mg/hr (02/27/17 0700)  . milrinone 0.25 mcg/kg/min (02/27/17 0700)     PRN Medications:  sodium chloride, acetaminophen, alum & mag hydroxide-simeth, camphor-menthol, guaiFENesin-dextromethorphan, iopamidol, lip balm, ondansetron (ZOFRAN) IV, promethazine, sodium chloride flush    Patient Profile  56 y/o woman with morbid obesity and little previous medical follow-up. Admitted 6/24 with severe anemia (hgb 3.7) and cardiogenic shock due biventricular HF R>L. Echo reviewed EF 30-35% with severe RV Failure and PAH. Has anasarca and progressive renal failure with inability to mobilize fluid. Transferred to [facility] on 7/4 for further evaluation by CHF team.    Assessment/Plan  1. 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 EF 30% with severe PAH and RV failure with D-shaped septum - Etiology of HF unclear DDx includes: tachy induced vs amyloid vs OHS vs hypothyroidism/myxedema. Low volts on ECG concerning for amyloid. Creatinine to high for CMRI.  Todays CO-OX is 71%.  - Continue  Milrinone. Add 2.5 mcg dopamine to try and improve diuresis.  - Stop b-blocker. Continue lasix gtt. Renal function trending up.  - HIV negative.  Rhuematoid Factor negative. Hepatitis Panel negative.  Check SPEP and UPEP pending.  - Hepatitis panel negative.  -  Eventually will need VQ and R/L heart cath and sleep study  2.  Iron deficiency anemia - Hgb on admit was 3.7. With low MCV. Iron stores low. - EGD with severe esophagitis  - Hgb trending down 8.1>7.3. Check FOBT.  - Received feraheme 02/26/2017.  Continue PPI.  - Will need colonoscopy- ? Inflammatory bowel disease or colon CA  3. Atrial flutter - Currently rate controlled on amio. Will continue - Not on AC due to anemia.  -Will eventually need AC followed by DC-CV and possible ablation - Started SQ heparin on 02/26/2017. Stop. Hgb down to 7.3. SCDs R and LLE for DVT prophylaxis.   4. AKI  - unclear baseline. Creatinine on admit was 1.9. Creatinine up to 2.11  - No blood or protein on UA -  Will  need renal u/s eventually. No obstruction seen on CT.  5. Anasarca/ascites in setting of severe protein calorie malnutitions - CT abdomen reviewed personally shows marked hepatic steatosis - Likely due to combination of RHF and fatty liver. Albumin 2.7. - prealbumin was < 5 on 02/18/17.   6. Hypothyroidism - Likely long-standing with spoon nails on exam. - TSH 11. Already started on synthroid - T3 1.3 T4 1.08  Also check cortisol.  - Unclear what role this is playing in her cardiomyopathy.  7. Leukocytosis -RLE wound does not appear infected. Buttock wounds appear partial thickness with evidence of induration.  PCT only 0.4. Repeat UA - Complete course of antibiotics (ceftriaxone 6/24 - 7/2) transition to keflex to complete 10 days given persistent leukocytosis. WBC 22. Afebrile - No evidence of malignancy on CT.   8. Severe fibroid uterus  9. Morbid obesity - will need nutrition to see. Body mass index is 43.51 kg/m. Prealbumin < 5.   10. RLE wound Continue foam dressing on RLE wound.   11. Probable OSA/OHS - Bipap as needed.   12-MASD (Moisture Associated Skin Damage) Buttock Wounds- Partial thickness- dose appear infected. Appears to be related to moisture and fecal incontinence. WOC consult pending. Will likely need barrier cream as  urine leaks under dressing. Prealbumin <5.   Length of Stay: [place], NP  02/27/2017, 9:53 AM  Advanced Heart Failure Team Pager [phone] (M-F; 7a - 4p)  Please contact [facility] for night-coverage after hours (4p -7a ) and weekends on [email or web address]  Patient seen and examined with Darrick Grinder, NP. We discussed all aspects of the encounter. I agree with the assessment and plan as stated above.   She remains extremely tenuous. Volume status remains elevated and has poor urine output despite milrinone support. Renal function worsening. Suspect recent hemodynamic insult has led to ATN. Will continue milrinone and add low-dose dopamine. That said, will likely have to wait for renal recovery before we see much benefit from inotropic support. Will likely need R +/- L heart cath this admission. Hgb continues to drift down. Will stop SQ heparin.   WBC remains elevated. ? Related to wounds. May need hematology to get involved in not improving. I will send peripheral smear.  Glori Bickers, MD  9:53 PM

## 2017-02-27 NOTE — Care Management Note (Signed)
Case Management Note  Patient Details  Name: Riannah Stagner MRN: [id number] Date of Birth: 1961/02/01  Subjective/Objective:   Pt admitted with HF   - transferred from [facility] with HF team  - on lasix and milrinone              Action/Plan:  PTA from home, recommendation is for SNF - CSW consulted   Expected Discharge Date:  02/26/17               Expected Discharge Plan:  [place] (CSW consulted )  In-House Referral:  Clinical Social Work  Discharge planning Services  CM Consult  Post Acute Care Choice:    Choice offered to:     DME Arranged:    DME Agency:     HH Arranged:    [place] Agency:     Status of Service:     If discussed at [employer], dates discussed:    Additional Comments:  Maryclare Labrador, RN 02/27/2017, 3:17 PM

## 2017-02-27 NOTE — Progress Notes (Signed)
Cardiology brief progress note  Called by nursing staff to inform me that patient has been requiring increasing doses of dopamine, is less responsive, and has decreased UOP.  Remains anasarcic.  Feel that she would benefit from transfer to the ICU, and given her co-morbid conditions have asked CCM this evening and assist with management.  Will also check PCT, co-ox, and lactate.  Greatly appreciate their assistance and will continue to monitor.    Regino Bellow, DO 11:57 PM

## 2017-02-28 ENCOUNTER — Inpatient Hospital Stay (HOSPITAL_COMMUNITY): Payer: Self-pay

## 2017-02-28 DIAGNOSIS — R570 Cardiogenic shock: Secondary | ICD-10-CM

## 2017-02-28 LAB — DIFFERENTIAL
BAND NEUTROPHILS: 0 %
BLASTS: 0 %
Basophils Absolute: 0 10*3/uL (ref 0.0–0.1)
Basophils Relative: 0 %
EOS PCT: 0 %
Eosinophils Absolute: 0 10*3/uL (ref 0.0–0.7)
Lymphocytes Relative: 6 %
Lymphs Abs: 1.7 10*3/uL (ref 0.7–4.0)
METAMYELOCYTES PCT: 0 %
MONOS PCT: 3 %
Monocytes Absolute: 0.9 10*3/uL (ref 0.1–1.0)
Myelocytes: 0 %
NEUTROS ABS: 25.9 10*3/uL — AB (ref 1.7–7.7)
NEUTROS PCT: 91 %
NRBC: 0 /100{WBCs}
Promyelocytes Absolute: 0 %

## 2017-02-28 LAB — CBC
HCT: 27.1 % — ABNORMAL LOW (ref 36.0–46.0)
Hemoglobin: 8.1 g/dL — ABNORMAL LOW (ref 12.0–15.0)
MCH: 22.8 pg — ABNORMAL LOW (ref 26.0–34.0)
MCHC: 29.9 g/dL — ABNORMAL LOW (ref 30.0–36.0)
MCV: 76.3 fL — ABNORMAL LOW (ref 78.0–100.0)
PLATELETS: 337 10*3/uL (ref 150–400)
RBC: 3.55 MIL/uL — AB (ref 3.87–5.11)
RDW: 30.8 % — ABNORMAL HIGH (ref 11.5–15.5)
WBC: 28.5 10*3/uL — AB (ref 4.0–10.5)

## 2017-02-28 LAB — BASIC METABOLIC PANEL
Anion gap: 10 (ref 5–15)
BUN: 58 mg/dL — ABNORMAL HIGH (ref 6–20)
CO2: 21 mmol/L — ABNORMAL LOW (ref 22–32)
Calcium: 9 mg/dL (ref 8.9–10.3)
Chloride: 105 mmol/L (ref 101–111)
Creatinine, Ser: 2.46 mg/dL — ABNORMAL HIGH (ref 0.44–1.00)
GFR, EST AFRICAN AMERICAN: 25 mL/min — AB (ref >60)
GFR, EST NON AFRICAN AMERICAN: 21 mL/min — AB (ref >60)
GLUCOSE: 120 mg/dL — AB (ref 65–99)
POTASSIUM: 3.5 mmol/L (ref 3.5–5.1)
Sodium: 136 mmol/L (ref 135–145)

## 2017-02-28 LAB — GLUCOSE, CAPILLARY
GLUCOSE-CAPILLARY: 112 mg/dL — AB (ref 65–99)
GLUCOSE-CAPILLARY: 109 mg/dL — AB (ref 65–99)
Glucose-Capillary: 120 mg/dL — ABNORMAL HIGH (ref 65–99)
Glucose-Capillary: 119 mg/dL — ABNORMAL HIGH (ref 65–99)
Glucose-Capillary: 118 mg/dL — ABNORMAL HIGH (ref 65–99)

## 2017-02-28 LAB — GLIADIN ANTIBODIES, SERUM
GLIADIN IGG: 2 U (ref 0–19)
Gliadin IgA: 4 units (ref 0–19)

## 2017-02-28 LAB — BLOOD GAS, ARTERIAL
ACID-BASE DEFICIT: 4.3 mmol/L — AB (ref 0.0–2.0)
Bicarbonate: 20.2 mmol/L (ref 20.0–28.0)
DRAWN BY: 437071
FIO2: 0.21
O2 Saturation: 88.7 %
PATIENT TEMPERATURE: 97.7
PH ART: 7.363 (ref 7.350–7.450)
pCO2 arterial: 36.2 mmHg (ref 32.0–48.0)
pO2, Arterial: 53.9 mmHg — ABNORMAL LOW (ref 83.0–108.0)

## 2017-02-28 LAB — ANTINUCLEAR ANTIBODIES, IFA: ANTINUCLEAR ANTIBODIES, IFA: POSITIVE — AB

## 2017-02-28 LAB — FANA STAINING PATTERNS

## 2017-02-28 LAB — RENAL FUNCTION PANEL
ALBUMIN: 2.4 g/dL — AB (ref 3.5–5.0)
ANION GAP: 11 (ref 5–15)
BUN: 45 mg/dL — AB (ref 6–20)
CHLORIDE: 102 mmol/L (ref 101–111)
CO2: 23 mmol/L (ref 22–32)
Calcium: 8.7 mg/dL — ABNORMAL LOW (ref 8.9–10.3)
Creatinine, Ser: 1.89 mg/dL — ABNORMAL HIGH (ref 0.44–1.00)
GFR calc Af Amer: 34 mL/min — ABNORMAL LOW (ref >60)
GFR calc non Af Amer: 29 mL/min — ABNORMAL LOW (ref >60)
GLUCOSE: 117 mg/dL — AB (ref 65–99)
PHOSPHORUS: 3.2 mg/dL (ref 2.5–4.6)
POTASSIUM: 3.5 mmol/L (ref 3.5–5.1)
Sodium: 136 mmol/L (ref 135–145)

## 2017-02-28 LAB — COOXEMETRY PANEL
CARBOXYHEMOGLOBIN: 1.4 % (ref 0.5–1.5)
METHEMOGLOBIN: 1.5 % (ref 0.0–1.5)
O2 SAT: 81.7 %
Total hemoglobin: 8.2 g/dL — ABNORMAL LOW (ref 12.0–16.0)

## 2017-02-28 LAB — MAGNESIUM: MAGNESIUM: 2.3 mg/dL (ref 1.7–2.4)

## 2017-02-28 LAB — PROTEIN ELECTROPHORESIS, SERUM
A/G Ratio: 0.9 (ref 0.7–1.7)
ALPHA-2-GLOBULIN: 0.7 g/dL (ref 0.4–1.0)
Albumin ELP: 2.4 g/dL — ABNORMAL LOW (ref 2.9–4.4)
Alpha-1-Globulin: 0.4 g/dL (ref 0.0–0.4)
Beta Globulin: 0.9 g/dL (ref 0.7–1.3)
GLOBULIN, TOTAL: 2.8 g/dL (ref 2.2–3.9)
Gamma Globulin: 0.8 g/dL (ref 0.4–1.8)
TOTAL PROTEIN ELP: 5.2 g/dL — AB (ref 6.0–8.5)

## 2017-02-28 LAB — SAVE SMEAR

## 2017-02-28 LAB — LACTIC ACID, PLASMA
Lactic Acid, Venous: 0.8 mmol/L (ref 0.5–1.9)
Lactic Acid, Venous: 0.8 mmol/L (ref 0.5–1.9)

## 2017-02-28 LAB — PROCALCITONIN: PROCALCITONIN: 1.44 ng/mL

## 2017-02-28 MED ORDER — PRISMASOL BGK 4/2.5 32-4-2.5 MEQ/L IV SOLN
INTRAVENOUS | Status: DC
  Administered 2017-02-28 – 2017-03-12 (×17): via INTRAVENOUS_CENTRAL
  Filled 2017-02-28 (×23): qty 5000

## 2017-02-28 MED ORDER — POTASSIUM CHLORIDE CRYS ER 20 MEQ PO TBCR
40.0000 meq | EXTENDED_RELEASE_TABLET | Freq: Once | ORAL | Status: AC
  Administered 2017-02-28: 40 meq via ORAL
  Filled 2017-02-28: qty 2

## 2017-02-28 MED ORDER — PRISMASOL BGK 4/2.5 32-4-2.5 MEQ/L IV SOLN
INTRAVENOUS | Status: DC
  Administered 2017-02-28 – 2017-03-13 (×51): via INTRAVENOUS_CENTRAL
  Filled 2017-02-28 (×66): qty 5000

## 2017-02-28 MED ORDER — PRISMASOL BGK 4/2.5 32-4-2.5 MEQ/L IV SOLN
INTRAVENOUS | Status: DC
Start: 2017-02-28 — End: 2017-03-13
  Administered 2017-02-28 – 2017-03-13 (×27): via INTRAVENOUS_CENTRAL
  Filled 2017-02-28 (×32): qty 5000

## 2017-02-28 MED ORDER — METOLAZONE 5 MG PO TABS
5.0000 mg | ORAL_TABLET | Freq: Every day | ORAL | Status: AC
  Administered 2017-02-28: 5 mg via ORAL
  Filled 2017-02-28: qty 1

## 2017-02-28 MED ORDER — HEPARIN SODIUM (PORCINE) 1000 UNIT/ML DIALYSIS
1000.0000 [IU] | INTRAMUSCULAR | Status: DC | PRN
  Administered 2017-03-08: 2400 [IU] via INTRAVENOUS_CENTRAL
  Filled 2017-02-28 (×2): qty 6

## 2017-02-28 MED ORDER — DEXTROSE 5 % IV SOLN
1.0000 g | INTRAVENOUS | Status: AC
  Administered 2017-02-28 – 2017-03-05 (×6): 1 g via INTRAVENOUS
  Filled 2017-02-28 (×6): qty 10

## 2017-02-28 MED ORDER — HEPARIN (PORCINE) 2000 UNITS/L FOR CRRT
INTRAVENOUS_CENTRAL | Status: DC | PRN
  Filled 2017-02-28 (×2): qty 1000

## 2017-02-28 NOTE — Progress Notes (Signed)
CSW continuing to follow to assist with placement needs when pt is closer to [place], [place] Social Worker [phone]

## 2017-02-28 NOTE — Progress Notes (Signed)
PT Cancellation Note  Patient Details Name: Samantha Richards MRN: [id number] DOB: 11/09/1960   Cancelled Treatment:    Reason Eval/Treat Not Completed: Patient at procedure or test/unavailable (HD to start per nurse. )   Denice Paradise 02/28/2017, 3:39 PM Bell Carbo,PT Acute Rehabilitation [phone] (pager)

## 2017-02-28 NOTE — Progress Notes (Signed)
2200 BP 75/39 (50)-rechecked 2205 BP 83/35 (49)- Patient drowsy, increased Dopamine drip to 70mcg/kg/min (8.47ml/hr). Notified Dr.Roby. 2215 BP 99/52 (66): Dopamine increase effective. Will continue to monitor. Notified Dr.Roby that patients urinary output since 1900 has only been <60ml's. Concerned about patient. Dr.Roby states he will talk with Dr. Haroldine Laws and will consult critical care to be primary on 2H. Dr.Roby asked me to notify [facility] provider on call (Dr.Kakrakandy), done. 2300 BP 95/48 (61): 2319 increased Dopamine to 5 mcg/kg/min (11.1 ml/hr). Notified Dr.Roby that this is last time I can increase drip on this unit. Critical care already at bedside. 0000 BP 96/47 (61): Critical Care at bedside aware of BP, awaiting transfer to [place]. 0020 BP 99/49 (65). Patient had a slight improvement in urine output with about 93ml's in the past hour. Awaiting bed. Patient sleeping. 0100 BP 90/48 (61): Report given to [facility] on [facility], will transfer patient to room 2H18.   Patient transferred to [facility] successfully, Patient A/O X 4. All personal belongings sent with patient. Patient aware of change in level of care and states, "I just want to feel better." Patient states she doesn't have any family close and doesn't want me to call anyone to notify about her transfer.

## 2017-02-28 NOTE — Consult Note (Signed)
Reason for Consult: Renal failure Referring Physician:  Dr. Haroldine Laws  Chief Complaint: Hypotension  Assessment/Plan: 1. Acute Kidney Injury - May be in ATN associated with the heart failure + poor perfusion leading to cardiorenal syndrome, but regardless with the lack of improvement despite appropriate therapy and decreased UOP on Lasix gtt + over 4kg up I agree that we should initiate CVVHD. She does endorse being able to ambulate less than a month ago and I agree we should give her a chance with RRT to see if she recovers. She's certainly not a candidate for iHD if her cardiac function does not improve. - Plan on initiating CVVHD with goal UF 61m/hr (net) and will titrate up as tolerated. - Appreciate Dr. YNelda Marseillepromptly seeing the pt and taking care of the catheter placement. - 4K standard bath should be fine. 2. 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 Iron deficiency anemia s/p Feraheme 02/26/2017 + transfusions. 4. Hypothyroidism started on Synthroid 5. Obesity.     HPI: Samantha Lindahlis an 56y.o. [facility] and was found to be in cardiogenic shock + anemia w/ a Hb of 3.7 req transfusions, pressors. She also was found to have afib RVR and started on an amiodarone gtt + biventricular heart failure tx with Lasix. [facility] further manage the cardiogenic shock -> Milrinone and dopamine. Her urine output was initially good with the Lasix gtt but has been trending down and despite appropriate therapy she's up over 4kg from admission with steadily worsening renal function. She cont to be on Amio, Dopamine, Lasix gtt, Milrinone. She uses NSAID's occasionally but not on a regular basis; she denies, rashes, frequent UTI's, hematuria, nephrolithiasis or DM. Interestingly, her mother was on dialysis but that was from DM. She currently denies cp but has worsening dyspnea and overall does not feel well. She was on Vancomycin  6/24-6/28. She denies ever being told in the past that she has CKD.  ROS Pertinent items are noted in HPI.  Chemistry and CBC: Creatinine, Ser  Date/Time Value Ref Range Status  02/28/2017 03:52 AM 2.46 (H) 0.44 - 1.00 mg/dL Final  02/27/2017 03:13 AM 2.11 (H) 0.44 - 1.00 mg/dL Final  02/26/2017 03:30 AM 2.03 (H) 0.44 - 1.00 mg/dL Final  02/25/2017 03:05 AM 1.88 (H) 0.44 - 1.00 mg/dL Final  02/24/2017 05:27 AM 1.85 (H) 0.44 - 1.00 mg/dL Final  02/23/2017 03:08 AM 1.76 (H) 0.44 - 1.00 mg/dL Final  02/22/2017 05:00 AM 1.84 (H) 0.44 - 1.00 mg/dL Final  02/21/2017 03:10 AM 1.86 (H) 0.44 - 1.00 mg/dL Final  02/20/2017 05:17 AM 1.89 (H) 0.44 - 1.00 mg/dL Final  02/19/2017 04:24 AM 1.97 (H) 0.44 - 1.00 mg/dL Final  02/18/2017 04:32 AM 1.99 (H) 0.44 - 1.00 mg/dL Final  02/17/2017 03:29 PM 2.05 (H) 0.44 - 1.00 mg/dL Final  02/17/2017 03:27 AM 1.96 (H) 0.44 - 1.00 mg/dL Final  02/16/2017 02:21 PM 1.96 (H) 0.44 - 1.00 mg/dL Final    Recent Labs Lab 02/22/17 0500 02/23/17 0308 02/24/17 0527 02/25/17 0305 02/26/17 0330 02/27/17 0313 02/28/17 0352  NA 140 141 137 139 138 137 136  K 4.1 4.0 4.0 4.1 3.6 3.2* 3.5  CL 113* 112* 109 110 107 108 105  CO2 18* 19* 19* 20* 20* 21* 21*  GLUCOSE 160* 140* 98 104* 120* 110* 120*  BUN 59* 57* 54* 56* 56* 54* 58*  CREATININE 1.84*  1.76* 1.85* 1.88* 2.03* 2.11* 2.46*  CALCIUM 8.7* 8.7* 8.7* 8.7* 8.8* 8.6* 9.0    Recent Labs Lab 02/25/17 0305 02/26/17 0330 02/27/17 0309 02/28/17 0352  WBC 21.0* 22.9* 22.0* 28.5*  NEUTROABS  --   --   --  25.9*  HGB 7.9* 8.1* 7.3* 8.1*  HCT 26.9* 26.1* 24.5* 27.1*  MCV 74.1* 73.9* 75.6* 76.3*  PLT 267 299 271 337   Liver Function Tests: No results for input(s): AST, ALT, ALKPHOS, BILITOT, PROT, ALBUMIN in the last 168 hours. No results for input(s): LIPASE, AMYLASE in the last 168 hours. No results for input(s): AMMONIA in the last 168 hours. Cardiac Enzymes: No results for input(s): CKTOTAL, CKMB,  CKMBINDEX, TROPONINI in the last 168 hours. Iron Studies: No results for input(s): IRON, TIBC, TRANSFERRIN, FERRITIN in the last 72 hours. PT/INR: @LABRCNTIP (inr:5)  Xrays/Other Studies: ) Results for orders placed or performed during the hospital encounter of 02/16/17 (from the past 48 hour(s))  Glucose, capillary     Status: Abnormal   Collection Time: 02/26/17 12:55 PM  Result Value Ref Range   Glucose-Capillary 151 (H) 65 - 99 mg/dL  Glucose, capillary     Status: Abnormal   Collection Time: 02/26/17  5:21 PM  Result Value Ref Range   Glucose-Capillary 102 (H) 65 - 99 mg/dL  Cooxemetry Panel (carboxy, met, total hgb, O2 sat)     Status: Abnormal   Collection Time: 02/26/17  5:31 PM  Result Value Ref Range   Total hemoglobin 8.1 (L) 12.0 - 16.0 g/dL   O2 Saturation 57.2 %   Carboxyhemoglobin 1.2 0.5 - 1.5 %   Methemoglobin 2.0 (H) 0.0 - 1.5 %  T4, free     Status: None   Collection Time: 02/26/17  6:31 PM  Result Value Ref Range   Free T4 1.08 0.61 - 1.12 ng/dL    Comment: (NOTE) Biotin ingestion may interfere with free T4 tests. If the results are inconsistent with the TSH level, previous test results, or the clinical presentation, then consider biotin interference. If needed, order repeat testing after stopping biotin.   T3, free     Status: Abnormal   Collection Time: 02/26/17  6:31 PM  Result Value Ref Range   T3, Free 1.3 (L) 2.0 - 4.4 pg/mL    Comment: (NOTE) Performed At: [facility] [place], [place] [id number] Lindon Romp MD [id number]   Rheumatoid factor     Status: None   Collection Time: 02/26/17  6:31 PM  Result Value Ref Range   Rhuematoid fact SerPl-aCnc <10.0 0.0 - 13.9 IU/mL    Comment: (NOTE) Performed At: [facility] [address] [place], [place] [id number] Lindon Romp MD [id number]   Hepatitis panel, acute     Status: None   Collection Time: 02/26/17  6:31 PM  Result Value Ref Range    Hepatitis B Surface Ag Negative Negative   HCV Ab <0.1 0.0 - 0.9 s/co ratio    Comment: (NOTE)                                  Negative:     < 0.8                             Indeterminate: 0.8 - 0.9  Positive:     > 0.9 The CDC recommends that a positive HCV antibody result be followed up with a HCV Nucleic Acid Amplification test ([id number]). Performed At: [facility] [place], [place] [id number] Lindon Romp MD [id number]    Hep A IgM Negative Negative   Hep B C IgM Negative Negative  Glucose, capillary     Status: Abnormal   Collection Time: 02/26/17  8:32 PM  Result Value Ref Range   Glucose-Capillary 102 (H) 65 - 99 mg/dL  .Cooxemetry Panel (carboxy, met, total hgb, O2 sat)     Status: Abnormal   Collection Time: 02/26/17  9:30 PM  Result Value Ref Range   Total hemoglobin 7.9 (L) 12.0 - 16.0 g/dL   O2 Saturation 70.7 %   Carboxyhemoglobin 2.1 (H) 0.5 - 1.5 %   Methemoglobin 1.2 0.0 - 1.5 %  Glucose, capillary     Status: Abnormal   Collection Time: 02/27/17 12:13 AM  Result Value Ref Range   Glucose-Capillary 134 (H) 65 - 99 mg/dL  CBC     Status: Abnormal   Collection Time: 02/27/17  3:09 AM  Result Value Ref Range   WBC 22.0 (H) 4.0 - 10.5 K/uL   RBC 3.24 (L) 3.87 - 5.11 MIL/uL   Hemoglobin 7.3 (L) 12.0 - 15.0 g/dL   HCT 24.5 (L) 36.0 - 46.0 %   MCV 75.6 (L) 78.0 - 100.0 fL   MCH 22.5 (L) 26.0 - 34.0 pg   MCHC 29.8 (L) 30.0 - 36.0 g/dL   RDW 30.6 (H) 11.5 - 15.5 %   Platelets 271 150 - 400 K/uL    Comment: REPEATED TO VERIFY PLATELET COUNT CONFIRMED BY SMEAR   Basic metabolic panel     Status: Abnormal   Collection Time: 02/27/17  3:13 AM  Result Value Ref Range   Sodium 137 135 - 145 mmol/L   Potassium 3.2 (L) 3.5 - 5.1 mmol/L   Chloride 108 101 - 111 mmol/L   CO2 21 (L) 22 - 32 mmol/L   Glucose, Bld 110 (H) 65 - 99 mg/dL   BUN 54 (H) 6 - 20 mg/dL   Creatinine, Ser 2.11 (H) 0.44 - 1.00 mg/dL    Calcium 8.6 (L) 8.9 - 10.3 mg/dL   GFR calc non Af Amer 26 (L) >60 mL/min   GFR calc Af Amer 30 (L) >60 mL/min    Comment: (NOTE) The eGFR has been calculated using the CKD EPI equation. This calculation has not been validated in all clinical situations. eGFR's persistently <60 mL/min signify possible Chronic Kidney Disease.    Anion gap 8 5 - 15  Magnesium     Status: None   Collection Time: 02/27/17  3:13 AM  Result Value Ref Range   Magnesium 2.1 1.7 - 2.4 mg/dL  Cooxemetry Panel (carboxy, met, total hgb, O2 sat)     Status: Abnormal   Collection Time: 02/27/17  3:22 AM  Result Value Ref Range   Total hemoglobin 7.5 (L) 12.0 - 16.0 g/dL   O2 Saturation 69.5 %   Carboxyhemoglobin 1.7 (H) 0.5 - 1.5 %   Methemoglobin 1.7 (H) 0.0 - 1.5 %  Glucose, capillary     Status: Abnormal   Collection Time: 02/27/17  4:13 AM  Result Value Ref Range   Glucose-Capillary 101 (H) 65 - 99 mg/dL  Glucose, capillary     Status: None   Collection Time: 02/27/17  9:31 AM  Result Value Ref Range  Glucose-Capillary 94 65 - 99 mg/dL  Glucose, capillary     Status: Abnormal   Collection Time: 02/27/17 12:29 PM  Result Value Ref Range   Glucose-Capillary 108 (H) 65 - 99 mg/dL  Glucose, capillary     Status: Abnormal   Collection Time: 02/27/17  1:41 PM  Result Value Ref Range   Glucose-Capillary 115 (H) 65 - 99 mg/dL  Glucose, capillary     Status: Abnormal   Collection Time: 02/27/17  4:57 PM  Result Value Ref Range   Glucose-Capillary 126 (H) 65 - 99 mg/dL  Urinalysis, Routine w reflex microscopic     Status: Abnormal   Collection Time: 02/27/17  7:00 PM  Result Value Ref Range   Color, Urine AMBER (A) YELLOW    Comment: BIOCHEMICALS MAY BE AFFECTED BY COLOR   APPearance CLOUDY (A) CLEAR   Specific Gravity, Urine 1.020 1.005 - 1.030   pH 5.0 5.0 - 8.0   Glucose, UA NEGATIVE NEGATIVE mg/dL   Hgb urine dipstick SMALL (A) NEGATIVE   Bilirubin Urine NEGATIVE NEGATIVE   Ketones, ur NEGATIVE  NEGATIVE mg/dL   Protein, ur 30 (A) NEGATIVE mg/dL   Nitrite NEGATIVE NEGATIVE   Leukocytes, UA MODERATE (A) NEGATIVE   RBC / HPF 6-30 0 - 5 RBC/hpf   WBC, UA 6-30 0 - 5 WBC/hpf   Bacteria, UA FEW (A) NONE SEEN   Squamous Epithelial / LPF 0-5 (A) NONE SEEN   Mucous PRESENT    Hyaline Casts, UA PRESENT   Glucose, capillary     Status: Abnormal   Collection Time: 02/27/17 10:32 PM  Result Value Ref Range   Glucose-Capillary 117 (H) 65 - 99 mg/dL  Glucose, capillary     Status: Abnormal   Collection Time: 02/27/17 11:26 PM  Result Value Ref Range   Glucose-Capillary 109 (H) 65 - 99 mg/dL  .Cooxemetry Panel (carboxy, met, total hgb, O2 sat)     Status: Abnormal   Collection Time: 02/27/17 11:30 PM  Result Value Ref Range   Total hemoglobin 7.9 (L) 12.0 - 16.0 g/dL   O2 Saturation 76.8 %   Carboxyhemoglobin 1.5 0.5 - 1.5 %   Methemoglobin 1.6 (H) 0.0 - 1.5 %  Procalcitonin - Baseline     Status: None   Collection Time: 02/27/17 11:38 PM  Result Value Ref Range   Procalcitonin 1.44 ng/mL    Comment:        Interpretation: PCT > 0.5 ng/mL and <= 2 ng/mL: Systemic infection (sepsis) is possible, but other conditions are known to elevate PCT as well. (NOTE)         ICU PCT Algorithm               Non ICU PCT Algorithm    ----------------------------     ------------------------------         PCT < 0.25 ng/mL                 PCT < 0.1 ng/mL     Stopping of antibiotics            Stopping of antibiotics       strongly encouraged.               strongly encouraged.    ----------------------------     ------------------------------       PCT level decrease by               PCT < 0.25 ng/mL       >=  80% from peak PCT       OR PCT 0.25 - 0.5 ng/mL          Stopping of antibiotics                                             encouraged.     Stopping of antibiotics           encouraged.    ----------------------------     ------------------------------       PCT level decrease by               PCT >= 0.25 ng/mL       < 80% from peak PCT        AND PCT >= 0.5 ng/mL             Continuing antibiotics                                              encouraged.       Continuing antibiotics            encouraged.    ----------------------------     ------------------------------     PCT level increase compared          PCT > 0.5 ng/mL         with peak PCT AND          PCT >= 0.5 ng/mL             Escalation of antibiotics                                          strongly encouraged.      Escalation of antibiotics        strongly encouraged.   Lactic acid, plasma     Status: None   Collection Time: 02/27/17 11:47 PM  Result Value Ref Range   Lactic Acid, Venous 0.8 0.5 - 1.9 mmol/L  Blood gas, arterial     Status: Abnormal   Collection Time: 02/28/17  1:00 AM  Result Value Ref Range   FIO2 0.21    pH, Arterial 7.363 7.350 - 7.450   pCO2 arterial 36.2 32.0 - 48.0 mmHg   pO2, Arterial 53.9 (L) 83.0 - 108.0 mmHg   Bicarbonate 20.2 20.0 - 28.0 mmol/L   Acid-base deficit 4.3 (H) 0.0 - 2.0 mmol/L   O2 Saturation 88.7 %   Patient temperature 97.7    Collection site LEFT RADIAL    Drawn by [id number]    Sample type ARTERIAL DRAW    Allens test (pass/fail) PASS PASS  Basic metabolic panel     Status: Abnormal   Collection Time: 02/28/17  3:52 AM  Result Value Ref Range   Sodium 136 135 - 145 mmol/L   Potassium 3.5 3.5 - 5.1 mmol/L   Chloride 105 101 - 111 mmol/L   CO2 21 (L) 22 - 32 mmol/L   Glucose, Bld 120 (H) 65 - 99 mg/dL   BUN 58 (H) 6 - 20 mg/dL   Creatinine, Ser 2.46 (H) 0.44 - 1.00 mg/dL   Calcium 9.0 8.9 - 10.3 mg/dL  GFR calc non Af Amer 21 (L) >60 mL/min   GFR calc Af Amer 25 (L) >60 mL/min    Comment: (NOTE) The eGFR has been calculated using the CKD EPI equation. This calculation has not been validated in all clinical situations. eGFR's persistently <60 mL/min signify possible Chronic Kidney Disease.    Anion gap 10 5 - 15  Magnesium     Status: None    Collection Time: 02/28/17  3:52 AM  Result Value Ref Range   Magnesium 2.3 1.7 - 2.4 mg/dL  CBC     Status: Abnormal   Collection Time: 02/28/17  3:52 AM  Result Value Ref Range   WBC 28.5 (H) 4.0 - 10.5 K/uL   RBC 3.55 (L) 3.87 - 5.11 MIL/uL   Hemoglobin 8.1 (L) 12.0 - 15.0 g/dL   HCT 27.1 (L) 36.0 - 46.0 %   MCV 76.3 (L) 78.0 - 100.0 fL   MCH 22.8 (L) 26.0 - 34.0 pg   MCHC 29.9 (L) 30.0 - 36.0 g/dL   RDW 30.8 (H) 11.5 - 15.5 %   Platelets 337 150 - 400 K/uL    Comment: REPEATED TO VERIFY PLATELET COUNT CONFIRMED BY SMEAR   Differential     Status: Abnormal   Collection Time: 02/28/17  3:52 AM  Result Value Ref Range   Neutrophils Relative % 91 %   Lymphocytes Relative 6 %   Monocytes Relative 3 %   Eosinophils Relative 0 %   Basophils Relative 0 %   Band Neutrophils 0 %   Metamyelocytes Relative 0 %   Myelocytes 0 %   Promyelocytes Absolute 0 %   Blasts 0 %   nRBC 0 0 /100 WBC   Neutro Abs 25.9 (H) 1.7 - 7.7 K/uL   Lymphs Abs 1.7 0.7 - 4.0 K/uL   Monocytes Absolute 0.9 0.1 - 1.0 K/uL   Eosinophils Absolute 0.0 0.0 - 0.7 K/uL   Basophils Absolute 0.0 0.0 - 0.1 K/uL   RBC Morphology POLYCHROMASIA PRESENT     Comment: ELLIPTOCYTES RARE NRBCs   Save smear     Status: None   Collection Time: 02/28/17  3:52 AM  Result Value Ref Range   Smear Review SMEAR STAINED AND AVAILABLE FOR REVIEW   Lactic acid, plasma     Status: None   Collection Time: 02/28/17  3:53 AM  Result Value Ref Range   Lactic Acid, Venous 0.8 0.5 - 1.9 mmol/L  Glucose, capillary     Status: Abnormal   Collection Time: 02/28/17  4:04 AM  Result Value Ref Range   Glucose-Capillary 120 (H) 65 - 99 mg/dL   Comment 1 Venous Specimen   Cooxemetry Panel (carboxy, met, total hgb, O2 sat)     Status: Abnormal   Collection Time: 02/28/17  4:05 AM  Result Value Ref Range   Total hemoglobin 8.2 (L) 12.0 - 16.0 g/dL   O2 Saturation 81.7 %   Carboxyhemoglobin 1.4 0.5 - 1.5 %   Methemoglobin 1.5 0.0 - 1.5  %  Glucose, capillary     Status: Abnormal   Collection Time: 02/28/17  8:03 AM  Result Value Ref Range   Glucose-Capillary 112 (H) 65 - 99 mg/dL  Glucose, capillary     Status: Abnormal   Collection Time: 02/28/17 11:45 AM  Result Value Ref Range   Glucose-Capillary 109 (H) 65 - 99 mg/dL   Comment 1 Notify RN    Dg Chest Port 1 View  Result Date: 02/27/2017 CLINICAL DATA:  Leukocytosis EXAM: PORTABLE CHEST 1 VIEW COMPARISON:  02/22/2017 FINDINGS: Left central line coils in the mediastinum, possibly within the azygos vein. Cardiomegaly with vascular congestion. Low lung volumes. Left lower lobe atelectasis or infiltrate. Right midlung atelectasis. No visible effusions. IMPRESSION: Cardiomegaly with vascular congestion. Right midlung atelectasis with left lower lobe airspace opacity which could reflect atelectasis or infiltrate. Left central line possibly within the azygos vein. Electronically Signed   By: Rolm Baptise M.D.   On: 02/27/2017 08:57    PMH:  History reviewed. No pertinent past medical history.  PSH:   Past Surgical History:  Procedure Laterality Date  . ESOPHAGOGASTRODUODENOSCOPY (EGD) WITH PROPOFOL N/A 02/20/2017   Procedure: ESOPHAGOGASTRODUODENOSCOPY (EGD) WITH PROPOFOL;  Surgeon: Teena Irani, MD;  Location: [facility] ENDOSCOPY;  Service: Endoscopy;  Laterality: N/A;    Allergies:  Allergies  Allergen Reactions  . Gentamicin Itching and Rash    Medications:   Prior to Admission medications   Not on File    Discontinued Meds:   Medications Discontinued During This Encounter  Medication Reason  . sodium chloride 0.9 % bolus 500 mL   . pantoprazole (PROTONIX) injection 40 mg   . phenylephrine (NEOSYNEPHRINE) 10-0.9 MG/250ML-% infusion   . Chlorhexidine Gluconate Cloth 2 % PADS 6 each   . vancomycin (VANCOCIN) 1,250 mg in sodium chloride 0.9 % 250 mL IVPB Change in therapy  . phenylephrine (NEOSYNEPHRINE) 10-0.9 MG/250ML-% infusion   . 0.9 %  sodium chloride infusion  Patient Transfer  . fentaNYL (SUBLIMAZE) injection 25-50 mcg Patient Transfer  . meperidine (DEMEROL) injection 6.25-12.5 mg Patient Transfer  . ondansetron (ZOFRAN) injection 4 mg Patient Transfer  . lactated ringers infusion Patient Transfer  . DOBUTamine (DOBUTREX) infusion 4000 mcg/mL Completed Course  . norepinephrine (LEVOPHED) 4 mg in dextrose 5 % 250 mL (0.016 mg/mL) infusion Completed Course  . furosemide (LASIX) injection 40 mg   . 0.9 %  sodium chloride infusion   . sodium chloride 0.9 % bolus 1,000 mL   . 0.9 %  sodium chloride infusion   . thiamine (B-1) injection 100 mg Change in therapy  . docusate sodium (COLACE) capsule 100 mg   . cefTRIAXone (ROCEPHIN) 2 g in dextrose 5 % 50 mL IVPB   . promethazine (PHENERGAN) suppository 25 mg   . metoprolol tartrate (LOPRESSOR) tablet 12.5 mg   . ferrous sulfate tablet 325 mg   . senna (SENOKOT) tablet 8.6 mg   . heparin injection 5,000 Units   . feeding supplement (ENSURE ENLIVE) (ENSURE ENLIVE) liquid 237 mL   . DOPamine (INTROPIN) 800 mg in dextrose 5 % 250 mL (3.2 mg/mL) infusion   . potassium chloride SA (K-DUR,KLOR-CON) CR tablet 60 mEq     Social History:  reports that she has never smoked. She has never used smokeless tobacco. She reports that she does not drink alcohol or use drugs.  Family History:   Family History  Problem Relation Age of Onset  . Diabetes Mother   . Hypertension Mother   . Heart disease Father   . Diabetes Sister     Blood pressure (!) 111/51, pulse (!) 110, temperature 98.1 F (36.7 C), temperature source Oral, resp. rate (!) 28, height 5' 5"  (1.651 m), weight 118 kg (260 lb 2.3 oz), last menstrual period 01/16/2017, SpO2 96 %. General appearance: alert, cooperative and appears stated age Head: Normocephalic, without obvious abnormality, atraumatic Eyes: negative Neck: no adenopathy, no carotid bruit, supple, symmetrical, trachea midline, thyroid not enlarged, symmetric, no  tenderness/mass/nodules and +  JVD Back: decub stage 2 Resp: rales bibasilar and bilaterally Chest wall: no tenderness Cardio: tachy GI: soft, non-tender; bowel sounds normal; no masses,  no organomegaly Extremities: edema diffuse 3+ Pulses: 2+ and symmetric Skin: Skin color, texture, turgor normal. No rashes or lesions Lymph nodes: Cervical, supraclavicular, and axillary nodes normal. Neurologic: Grossly normal       Samantha Richards, Hunt Oris, MD 02/28/2017, 12:38 PM

## 2017-02-28 NOTE — Progress Notes (Signed)
PULMONARY / CRITICAL CARE MEDICINE   Name: Samantha Richards MRN: [id number] DOB: 05/25/1961    ADMISSION DATE:  02/16/2017 CONSULTATION DATE:  02/27/17  REFERRING MD:  Dr. Wynetta Emery  CHIEF COMPLAINT:  Hypotension  HISTORY OF PRESENT ILLNESS:   Samantha Richards is a 56 y.o. female who presented to [facility] on 6/24 after a fall at home found to be in hemorrhagic/hypovolemic and cardiogenic shock. She was severely anemic to Hgb 3.7 in the setting of GI bleed. Requiring transfusions, pressors and BiPAP in ICU. She was also treated for cellulitis of the back. Levophed was weaned 6/28 and the patient was transferred to hospitalist service 6/29. Cardiology has been managing AFib with RVR with amiodarone gtt and biventricular heart failure R>L with lasix gtt. She had cardiogenic shock prompting transfer to [facility] on 7/4 for further evaluation by CHF team and initiation of milrinone and dopamine gtt. She also has anasarca and progressive renal failure with oliguria and hypotension.   SUBJECTIVE:  Diffuse pain, requires pressor support with dopamine and a lasix drip  VITAL SIGNS: BP (!) 97/57   Pulse (!) 113   Temp 98.1 F (36.7 C) (Oral)   Resp (!) 21   Ht 5\' 5"  (1.651 m)   Wt 118 kg (260 lb 2.3 oz)   LMP 01/16/2017 Comment: neg preg 02-16-2017  SpO2 98%   BMI 43.29 kg/m   HEMODYNAMICS: CVP:  [19 mmHg-27 mmHg] 21 mmHg  VENTILATOR SETTINGS:    INTAKE / OUTPUT: I/O last 3 completed shifts: In: 1859.9 [P.O.:390; I.V.:1469.9] Out: 450 [Urine:450]  PHYSICAL EXAMINATION: General:  Chronic ill appearing woman, laying in bed. Appears uncomfortable. Neuro:  Alert, awake intermittently but having difficulty keeping eyes open. Able to converse and oriented. HEENT:  [place]/AT, PERRL, EOM-I and MMM  Cardiovascular:  Tachycardic, normal s1 and s2. No murmurs. + JVD Lungs:  Poor air movement in bases, no rhonchi with anterior auscultation Abdomen:  Obese, soft, nontender, pitting edema over abdomen, +  bowel sounds Musculoskeletal:  3+ pitting edema  Skin:  Chronic skin changes in b/l LE  LABS:  BMET  Recent Labs Lab 02/26/17 0330 02/27/17 0313 02/28/17 0352  NA 138 137 136  K 3.6 3.2* 3.5  CL 107 108 105  CO2 20* 21* 21*  BUN 56* 54* 58*  CREATININE 2.03* 2.11* 2.46*  GLUCOSE 120* 110* 120*    Electrolytes  Recent Labs Lab 02/26/17 0330 02/27/17 0313 02/28/17 0352  CALCIUM 8.8* 8.6* 9.0  MG  --  2.1 2.3    CBC  Recent Labs Lab 02/26/17 0330 02/27/17 0309 02/28/17 0352  WBC 22.9* 22.0* 28.5*  HGB 8.1* 7.3* 8.1*  HCT 26.1* 24.5* 27.1*  PLT 299 271 337    Coag's No results for input(s): APTT, INR in the last 168 hours.  Sepsis Markers  Recent Labs Lab 02/23/17 0308 02/24/17 0527 02/27/17 2338 02/27/17 2347 02/28/17 0353  LATICACIDVEN  --   --   --  0.8 0.8  PROCALCITON 0.35 0.35 1.44  --   --     ABG  Recent Labs Lab 02/28/17 0100  PHART 7.363  PCO2ART 36.2  PO2ART 53.9*    Liver Enzymes No results for input(s): AST, ALT, ALKPHOS, BILITOT, ALBUMIN in the last 168 hours.  Cardiac Enzymes No results for input(s): TROPONINI, PROBNP in the last 168 hours.  Glucose  Recent Labs Lab 02/27/17 1341 02/27/17 1657 02/27/17 2232 02/27/17 2326 02/28/17 0404 02/28/17 0803  GLUCAP 115* 126* 117* 109* 120* 112*  Imaging No results found.   STUDIES:  XR right Knee 6/24 > No Acute CT abd/pelvis 6/25 > anasarca with b/l pleural effusions, ascities. Fibroid uterus. Cardiomegaly with severe R atrial dilation. TTE 6/25 > EF30-35%, diffuse hypokinesis, LA dilated, mild MR, RA mod dilated with signs of significant cor pulmonale. LE venous doppler 6/25 > no DVT or SVT b/l US Paracentesis 6/26 > 2.5 L peritoneal fluid yielded, cytology negative EGD 6/28 > intense distal esophagitis with focal hemorrhagic hemorrhage, small hiatal hernia CXR 7/5 > Cardiomegaly with vascular congestion, R midlung atelectasis with LLL  opacity.   CULTURES: MRSA PCR neg 6/24 Blood cultures 6/24 > 1 of 2 coag neg staph, likely contaminate Peritoneal fluid culture 6/26 > No growth 5 days, neg for AFB HIV 6/29 > negative  ANTIBIOTICS: Vancomycin 6/24 > 6/28 Rocephin 6/25 > 7/2 Keflex 7/2 > 7/4  SIGNIFICANT EVENTS: 6/24  Presents to [facility], admitted to ICU  6/29  Off vasopressors, transferred out to SDU 7/02  Lasix gtt initiated 7/04  Transferred to [facility]. Milrinone started 7/05  Dopamine started, transfer to ICU  LINES/TUBES: L IJ CVL 6/26 >> Foley 6/24 >>  DISCUSSION: 56 y.o. female who presented after a fall at home found to be in hemorrhagic/hypovolemic with severe anemia and cardiogenic shock due to biventricular heart failure. Now with worsening anasarca and progressive renal failure with oliguria and hypotension.  ASSESSMENT / PLAN:  PULMONARY A: Chest congestion Probable sleep apnea, intolerant of CPAP in hospital P:   Monitor in ICU for signs of respiratory failure Titrate O2 for sat of 88-92% Pulmonary hygiene- mobilize, IS  CARDIOVASCULAR A:  Acute systolic heart failure with RV overload/cor pulmonale  Afib with RVR, improved Cardiogenic shock  P:  Cardiology and CHF team following Tele monitoring  CVP Q Shift 21 Keep MAP > 65 Continue amiodarone, lasix, milrinone, dopamine gtt Fluid / Na restriction Will likely need R +/- L heart cath this admission but will defer to cards  RENAL A:   Acute kidney injury, worsening Oliguria P:   Trend BMET and UOP Replete electrolytes as indicated Avoid nephrotoxic agents Will ask renal service to evaluate patient since UOP is minimal with lasix drip Will give a dose of zaroxolyn  GASTROINTESTINAL A:   Upper GI bleed on EGD Severe protein-calorie malnutrition with kwashiokor Hepatic steatosis Ascites > cytology negative Peritoneal mets vs uterine fibroids P:   PPI BID  Nutrition following, continue supplements  HEMATOLOGIC A:   Anemia,  likely multifactorial from GI bleed and chronic malnutrition P:  Trend CBC  Transfuse for Hgb <7 Continue ferrous sulfate Holding SQ heparin Peripheral smear pending  INFECTIOUS A:   Skin breakdown/wounds Cellulitis P:   Wound care Monitor skin break down on legs from severe anasarca  ENDOCRINE A:   Mild hyperglycemia Hypothyroid P:   Synthroid  Follow up TSH in 6 weeks > around 8/1 SSI   NEUROLOGIC A:   Deconditioning P:   PT consulted  FAMILY  - Updates: No family at bedside  The patient is critically ill with multiple organ systems failure and requires high complexity decision making for assessment and support, frequent evaluation and titration of therapies, application of advanced monitoring technologies and extensive interpretation of multiple databases.   Critical Care Time devoted to patient care services described in this note is  68  Minutes. This time reflects time of care of this signee Dr Jennet Maduro. This critical care time does not reflect procedure time, or teaching time or  supervisory time of PA/NP/Med student/Med Resident etc but could involve care discussion time.  Rush Farmer, M.D. [facility] Pulmonary/Critical Care Medicine. Pager: [phone]. After hours pager: [phone].  02/28/2017 10:28 AM

## 2017-02-28 NOTE — Consult Note (Signed)
STAFF NOTE: I, Dr Ann Lions have personally reviewed patient's available data, including medical history, events of note, physical examination and test results as part of my evaluation. I have discussed with resident/NP and other care providers such as pharmacist, RN and RRT.  In addition,  I personally evaluated patient and elicited key findings of   S: 56 year old with no known pmhx and poor social situation. Originally admitted to ICU 02/16/2017 in aftermath of several weeks of GIB (EGD gastritis per resident) and severe anemia - hgb 3-4gm%. After intiiatl resus in ICU went to floor but then has had ongoing issues with cardiogenic shock (ef 30%) and over past 24-48h needing increasing circulatory support in sDU that needig ICU transfer. Also oliguric. Cards wanting ccm to be primary  O: Talking Looks stable On multiple iontropes  Scvo2 76% -  bic 21 K 3.2  A: Cardiorenal syndrome with cardiogenic shock and ATN/AKI  - no immediate indication for CRRT  P: move to ICU ionotrope support  Check if cards can be primary > 7am  02/28/2017    .  Rest per NP/medical resident whose note is outlined above and that I agree with  The patient is critically ill with multiple organ systems failure and requires high complexity decision making for assessment and support, frequent evaluation and titration of therapies, application of advanced monitoring technologies and extensive interpretation of multiple databases.   Critical Care Time devoted to patient care services described in this note is  30  Minutes. This time reflects time of care of this signee Dr Brand Males. This critical care time does not reflect procedure time, or teaching time or supervisory time of PA/NP/Med student/Med Resident etc but could involve care discussion time    Dr. Brand Males, M.D., [facility].C.P Pulmonary and Critical Care Medicine Staff Physician [place] [facility] Pager: [phone], If no answer or between  15:00h - 7:00h: call [phone]  02/28/2017 12:40 AM

## 2017-02-28 NOTE — Progress Notes (Signed)
Patient ID: Samantha Richards, female   DOB: 09-12-1960, 56 y.o.   MRN: [id number]  PROGRESS NOTE    Alisha Bacus  QIW:[id number] DOB: 06-18-1961 DOA: 02/16/2017  PCP: Patient, No Pcp Per   Brief Narrative:  56 y.o. female who presented on 6/24 after a fall at home found to be in hemorrhagic/hypovolemic and cardiogenic shock, severely anemic in the setting of slow GI bleed and Hgb 3.7. Transfusions and BiPAP provided in addition to vancomycin/ceftriaxone for cellulitis of the back. EGD 6/28 showed gastritis and esophagitis, and pt declined colonoscopy. Levophed was weaned 6/28 and the patient was transferred to hospitalist service 6/29. AFib with RVR has been managed by cardiology, and has converted to sinus rhythm. EF 30% with evidence of RV overload/cor pulmonale. She appears severely malnourished with anasarca with ascites, paracentesis 6/26 of 2.5L fluid with negative cytology and culture. Lasix infusion was started 7/2.  Per cardiology assessment, pt may benefit from milrinone drip and transferred to [facility] to be seen by Heart Failure Team.   Assessment & Plan:   Cardiogenic shock with acute systolic and diastolic CHF and RV overload and cor pulmonale - Off pressor support since 6/28 - Cortisol 25.8 - ECHO showed RV overload, diffusely hypokinetic and LVEF 30-35% - Pt has ongoing diffuse anasarca that is not improving much - Started on milrinone drip per HF team - Now on dobutamine drip and amiodarone drip  Acute blood loss anemia / Acute upper GI bleed / hematemesis / Iron deficiency anemia - Symptomatic anemia with hgb of 3.7 on admission - Occult negative on admission - EGD 6/28 showed gastritis / esophagitis - GI signed off 6/29, recommended to call back when pt more stable and willing to undergo colonoscopy  - Continue Protonix 40 mg BID - Continue ferrous sulfate supplementation   Ascites - SAAG > 1.1 - Negative SBP - S/P paracentesis  - Given increasing leukocytosis, will  start ceftriaxone to empirically cover for SBP  Hypokalemia - replaced orally.    CKD stage 3 - Cr continues to trend up, likely in the setting of IV lasix  - Per cardio, plan to switch to milrinone drip   Generalized weakness - In the setting of acute decompensated CHF - PT recommended SNF placement  Atrial fibrillation with RVR - CHADS vasc score at least 3 - On amiodarone drip, metoprolol 12.5 mg PO BID - Cardiology following - Not on [facility] due to risk of GI bleed   Cellulitis, right lower extremity / Leukocytosis  - given worsening leukocytosis, DC cephalexin, restart ceftriaxone 7/6 - Vanco and rocephin stopped but planning to restart ceftriaxone 7/6.  - check portable CXR - urinalysis suggestive of UTI  Leukocytosis - WBC continues to trend high, UA done yesterday suspicious for UTI, also concerned about SBP given abdominal ascites, will go ahead and start back on IV ceftriaxone to cover for SBP and UTI.  Follow CBC with diff, follow cultures.  For any temp greater than 100.4, recommend repeating blood cultures.   Hypothyroidism - TSH 11.7 - Started levothyroxine, recommending to recheck TSH in 6-8 weeks  Obesity / Severe protein calorie malnutrition  - Counseled on diet - Seen by nutritionist   Stage 2 pressure skin injury to bilateral buttocks and right lower extremity  - Seen by wound care, appreciate their assessment - Bilateral buttocks 18 cm x 30 cm with partial thickness tissue loss - Right lateral and posterior foot with 15 cm x 15 cm area of maceration with serous exudate -  Added bariatric mattress, moisture barrier ointment   DVT prophylaxis: heparin Code Status: full code  Family Communication: no family at the bedside during rounds Disposition Plan: TBD    Consultants:   Cardiology, HF team  GI, [facility]   Pulmonary / CCM   Nutrition  PT - SNF recommended   WOC  Procedures:   Paracentesis 6/26 - 2.5 L fluid removed   B/ LE venous doppler  6/25 - no DVT  EGD 6/28 - distal esophagitis and gastritis   Antimicrobials:   Vanco 6/24 --> 6/28  Rocephin 6/24 --> 7/2  Cephalexin 7/2 -->  Subjective: Pt transferred to ICU due to need for pressors, Pt complaining of headache this morning  Objective: Vitals:   02/28/17 0630 02/28/17 0645 02/28/17 0700 02/28/17 0715  BP: (!) 99/55 (!) 100/58 (!) 96/57 (!) 106/54  Pulse: (!) 108 (!) 107 (!) 109 (!) 110  Resp: 17 16 18 18   Temp:      TempSrc:      SpO2: 96% 97% 90% 91%  Weight:      Height:        Intake/Output Summary (Last 24 hours) at 02/28/17 0802 Last data filed at 02/28/17 0700  Gross per 24 hour  Intake          1236.01 ml  Output              150 ml  Net          1086.01 ml   Filed Weights   02/25/17 1700 02/26/17 0500 02/28/17 0145  Weight: 116.7 kg (257 lb 4.4 oz) 118.6 kg (261 lb 7.5 oz) 118 kg (260 lb 2.3 oz)    Examination:  General exam: Appears calm and comfortable, NAD.  Respiratory system: Diminished breath sounds bilateral with bibasilar crackles, no wheezing  Cardiovascular system: irregular, S1 & S2 heard, Rate controlled, (+) JVD Gastrointestinal system: Abdomen is distended, soft and nontender. No organomegaly or masses felt. Normal bowel sounds heard. Central nervous system: Alert and oriented. No focal neurological deficits. Extremities: 2+ pitting edema B/L LE Skin: Right LE wound scaling open lesions cracked skin BLEs.  Psychiatry: normal affect.  Data Reviewed: I have personally reviewed following labs and imaging studies  CBC:  Recent Labs Lab 02/24/17 0527 02/25/17 0305 02/26/17 0330 02/27/17 0309 02/28/17 0352  WBC 23.2* 21.0* 22.9* 22.0* 28.5*  NEUTROABS  --   --   --   --  25.9*  HGB 8.4* 7.9* 8.1* 7.3* 8.1*  HCT 27.2* 26.9* 26.1* 24.5* 27.1*  MCV 74.9* 74.1* 73.9* 75.6* 76.3*  PLT 278 267 299 271 496   Basic Metabolic Panel:  Recent Labs Lab 02/24/17 0527 02/25/17 0305 02/26/17 0330 02/27/17 0313  02/28/17 0352  NA 137 139 138 137 136  K 4.0 4.1 3.6 3.2* 3.5  CL 109 110 107 108 105  CO2 19* 20* 20* 21* 21*  GLUCOSE 98 104* 120* 110* 120*  BUN 54* 56* 56* 54* 58*  CREATININE 1.85* 1.88* 2.03* 2.11* 2.46*  CALCIUM 8.7* 8.7* 8.8* 8.6* 9.0  MG  --   --   --  2.1 2.3   GFR: Estimated Creatinine Clearance: 34 mL/min (A) (by C-G formula based on SCr of 2.46 mg/dL (H)). Liver Function Tests: No results for input(s): AST, ALT, ALKPHOS, BILITOT, PROT, ALBUMIN in the last 168 hours. No results for input(s): LIPASE, AMYLASE in the last 168 hours. No results for input(s): AMMONIA in the last 168 hours. Coagulation Profile: No results for  input(s): INR, PROTIME in the last 168 hours. Cardiac Enzymes: No results for input(s): CKTOTAL, CKMB, CKMBINDEX, TROPONINI in the last 168 hours. BNP (last 3 results) No results for input(s): PROBNP in the last 8760 hours. HbA1C: No results for input(s): HGBA1C in the last 72 hours. CBG:  Recent Labs Lab 02/27/17 1229 02/27/17 1341 02/27/17 1657 02/27/17 2232 02/27/17 2326  GLUCAP 108* 115* 126* 117* 109*   Lipid Profile: No results for input(s): CHOL, HDL, LDLCALC, TRIG, CHOLHDL, LDLDIRECT in the last 72 hours. Thyroid Function Tests:  Recent Labs  02/26/17 1831  FREET4 1.08  T3FREE 1.3*   Anemia Panel: No results for input(s): VITAMINB12, FOLATE, FERRITIN, TIBC, IRON, RETICCTPCT in the last 72 hours. Urine analysis:    Component Value Date/Time   COLORURINE AMBER (A) 02/27/2017 1900   APPEARANCEUR CLOUDY (A) 02/27/2017 1900   LABSPEC 1.020 02/27/2017 1900   PHURINE 5.0 02/27/2017 1900   GLUCOSEU NEGATIVE 02/27/2017 1900   HGBUR SMALL (A) 02/27/2017 1900   BILIRUBINUR NEGATIVE 02/27/2017 1900   KETONESUR NEGATIVE 02/27/2017 1900   PROTEINUR 30 (A) 02/27/2017 1900   NITRITE NEGATIVE 02/27/2017 1900   LEUKOCYTESUR MODERATE (A) 02/27/2017 1900    Recent Results (from the past 240 hour(s))  Acid Fast Smear (AFB)     Status:  None   Collection Time: 02/18/17 12:00 PM  Result Value Ref Range Status   AFB Specimen Processing Concentration  Final   Acid Fast Smear Negative  Final    Comment: (NOTE) Performed At: [facility] [address] [place], [place] [id number] Lindon Romp MD BZ:[id number]    Source (AFB) PERITONEAL  Final  Culture, body fluid-bottle     Status: None   Collection Time: 02/18/17 12:00 PM  Result Value Ref Range Status   Specimen Description PERITONEAL  Final   Special Requests NONE  Final   Culture   Final    NO GROWTH 5 DAYS Performed at [facility], [place] [address], [place], [place] [address]    Report Status 02/23/2017 FINAL  Final  Gram stain     Status: None   Collection Time: 02/18/17 12:00 PM  Result Value Ref Range Status   Specimen Description PERITONEAL  Final   Special Requests NONE  Final   Gram Stain   Final    RARE WBC PRESENT, PREDOMINANTLY MONONUCLEAR NO ORGANISMS SEEN Performed at [facility], [place] [address] [place], [place] [address]    Report Status 02/18/2017 FINAL  Final    Radiology Studies: Dg Chest Port 1 View Result Date: 02/22/2017 Probable bibasilar atelectasis.   Scheduled Meds: . Chlorhexidine Gluconate Cloth  6 each Topical Daily  . feeding supplement (ENSURE ENLIVE)  237 mL Oral BID BM  . feeding supplement (PRO-STAT SUGAR FREE 64)  30 mL Oral BID  . ferrous sulfate  325 mg Oral Q breakfast  . fluticasone  2 spray Each Nare Daily  . insulin aspart  0-15 Units Subcutaneous Q4H  . levothyroxine  100 mcg Oral QAC breakfast  . mouth rinse  15 mL Mouth Rinse BID  . multivitamin with minerals  1 tablet Oral Daily  . pantoprazole  40 mg Oral BID  . potassium chloride  60 mEq Oral Once  . senna-docusate  1 tablet Oral BID  . sodium chloride flush  10-40 mL Intracatheter Q12H  . thiamine  100 mg Oral Daily   Continuous Infusions: . sodium chloride    . amiodarone 30 mg/hr (02/28/17 0700)  .  DOPamine 10 mcg/kg/min  (02/28/17 0700)  . furosemide (LASIX) infusion 10 mg/hr (02/28/17 0700)  . milrinone 0.25 mcg/kg/min (02/28/17 0700)     LOS: 12 days   Critical Care Time spent:33 minutes  Greater than 50% of the time spent on counseling and coordinating the care.  Irwin Brakeman, MD [facility] Pager [phone]  If 7PM-7AM, please contact night-coverage [email or web address] Password [email or web address] 02/28/2017, 8:02 AM

## 2017-02-28 NOTE — Procedures (Signed)
Hemodialysis Insertion Procedure Note Samantha Richards [id number] [birth year]  Procedure: Insertion of Hemodialysis Catheter Type: 3 port  Indications: Hemodialysis   Procedure Details Consent: Risks of procedure as well as the alternatives and risks of each were explained to the (patient/caregiver).  Consent for procedure obtained. Time Out: Verified patient identification, verified procedure, site/side was marked, verified correct patient position, special equipment/implants available, medications/allergies/relevent history reviewed, required imaging and test results available.  Performed  Maximum sterile technique was used including antiseptics, cap, gloves, gown, hand hygiene, mask and sheet. Skin prep: Chlorhexidine; local anesthetic administered A antimicrobial bonded/coated triple lumen catheter was placed in the right internal jugular vein using the Seldinger technique. Ultrasound guidance used.Yes.   Catheter placed to 15 cm. Blood aspirated via all 3 ports and then flushed x 3. Line sutured x 2 and dressing applied.  Evaluation Blood flow good Complications: No apparent complications Patient did tolerate procedure well. Chest X-ray ordered to verify placement.  CXR: pending.  Georgann Housekeeper, AGACNP-BC [place] [facility]monology/Critical Care Pager [phone] or [phone]  02/28/2017 2:39 PM   Rush Farmer, M.D. [facility] Pulmonary/Critical Care Medicine. Pager: [phone]. After hours pager: [phone]

## 2017-02-28 NOTE — Progress Notes (Signed)
Paged cardiology regarding pt's low BP; received orders to increase dopamine upper limit to 10 mcg/kg/min. Will implement orders and continue to monitor.  Sherlie Ban, RN

## 2017-02-28 NOTE — Progress Notes (Signed)
PT Cancellation Note  Patient Details Name: Edyth Glomb MRN: [id number] DOB: April 08, 1961   Cancelled Treatment:    Reason Eval/Treat Not Completed: Other (comment) (nauseated.  Declined.  Will reattempt as able. )   Godfrey Pick Jalyn Rosero 02/28/2017, 12:57 PM [facility] [phone] (pager)

## 2017-02-28 NOTE — Progress Notes (Addendum)
Advanced Heart Failure Rounding Note  PCP:  Primary Cardiologist: Dr Acie Fredrickson   Subjective:    02/26/17 started on milrinone and placed on lasix drip.   Evening of 02/27/17 pts pressures fell requiring more dopamine, became less responsive and UOP worsening. Pt transferred to [place].   Creatinine trending up. 2.0 -> 2.1 -> 2.46. K 3.5. CVP 19-20  Feeling down this am. Says she has spells where she feels OK then feels worse again. Coughing. No production. Afebrile.  Coox 81.7% this am on dopamine 10 mcg/kg/min and milrinone 0.25 mcg/kg/min. I/O + 1.1 L yesterday.  Weight shows down 1 lb.  Up 14 lbs from lowest weight this admission.   Per RN had 150 cc of UO in foley this am, and additional 60 cc in bag currently.   Objective:   Weight Range: 260 lb 2.3 oz (118 kg) Body mass index is 43.29 kg/m.   Vital Signs:   Temp:  [97.2 F (36.2 C)-98.4 F (36.9 C)] 98.1 F (36.7 C) (07/06 0715) Pulse Rate:  [100-116] 113 (07/06 0800) Resp:  [13-30] 21 (07/06 0800) BP: (75-106)/(35-61) 97/57 (07/06 0800) SpO2:  [88 %-100 %] 98 % (07/06 0800) Weight:  [260 lb 2.3 oz (118 kg)] 260 lb 2.3 oz (118 kg) (07/06 0145) Last BM Date: 02/25/17  Weight change: Filed Weights   02/25/17 1700 02/26/17 0500 02/28/17 0145  Weight: 257 lb 4.4 oz (116.7 kg) 261 lb 7.5 oz (118.6 kg) 260 lb 2.3 oz (118 kg)    Intake/Output:   Intake/Output Summary (Last 24 hours) at 02/28/17 0848 Last data filed at 02/28/17 0800  Gross per 24 hour  Intake          1291.86 ml  Output              210 ml  Net          1081.86 ml     Physical Exam   CVP 19-20 General: Chronically ill appearing. NAD at rest.  HEENT: Normal Neck: Supple. JVP to jaw. Carotids 2+ bilat; no bruits. No thyromegaly or nodule noted. Cor: PMI nondisplaced. Tachy. Regular, No M/G/R noted Lungs: Diminished basilar sounds.  Abdomen: Soft, non-tender, non-distended, no HSM. No bruits or masses. +BS  Extremities: No cyanosis or clubbing. 3+  BLE edema.  Neuro: Alert & orientedx3, cranial nerves grossly intact. moves all 4 extremities w/o difficulty. Affect pleasant  GU: Foley dark yellow urine. Skin: RLE partial thickness wound 3x1.5 cm 100% pink. Buttock with multiple partial thickness wounds.    Telemetry   Personally reviewed, A-flutter in 100s   EKG   Previously viewed from 02/24/2017 A Flutter 100 bpm   Labs    CBC  Recent Labs  02/27/17 0309 02/28/17 0352  WBC 22.0* 28.5*  NEUTROABS  --  25.9*  HGB 7.3* 8.1*  HCT 24.5* 27.1*  MCV 75.6* 76.3*  PLT 271 588   Basic Metabolic Panel  Recent Labs  02/27/17 0313 02/28/17 0352  NA 137 136  K 3.2* 3.5  CL 108 105  CO2 21* 21*  GLUCOSE 110* 120*  BUN 54* 58*  CREATININE 2.11* 2.46*  CALCIUM 8.6* 9.0  MG 2.1 2.3   Liver Function Tests No results for input(s): AST, ALT, ALKPHOS, BILITOT, PROT, ALBUMIN in the last 72 hours. No results for input(s): LIPASE, AMYLASE in the last 72 hours. Cardiac Enzymes No results for input(s): CKTOTAL, CKMB, CKMBINDEX, TROPONINI in the last 72 hours.  BNP: BNP (last 3 results)  Recent Labs  02/16/17 1516  BNP 923.6*    ProBNP (last 3 results) No results for input(s): PROBNP in the last 8760 hours.   D-Dimer No results for input(s): DDIMER in the last 72 hours. Hemoglobin A1C No results for input(s): HGBA1C in the last 72 hours. Fasting Lipid Panel No results for input(s): CHOL, HDL, LDLCALC, TRIG, CHOLHDL, LDLDIRECT in the last 72 hours. Thyroid Function Tests  Recent Labs  02/26/17 1831  T3FREE 1.3*    Other results:   Imaging    Dg Chest Port 1 View  Result Date: 02/27/2017 CLINICAL DATA:  Leukocytosis EXAM: PORTABLE CHEST 1 VIEW COMPARISON:  02/22/2017 FINDINGS: Left central line coils in the mediastinum, possibly within the azygos vein. Cardiomegaly with vascular congestion. Low lung volumes. Left lower lobe atelectasis or infiltrate. Right midlung atelectasis. No visible effusions.  IMPRESSION: Cardiomegaly with vascular congestion. Right midlung atelectasis with left lower lobe airspace opacity which could reflect atelectasis or infiltrate. Left central line possibly within the azygos vein. Electronically Signed   By: Rolm Baptise M.D.   On: 02/27/2017 08:57     Medications:     Scheduled Medications: . Chlorhexidine Gluconate Cloth  6 each Topical Daily  . feeding supplement (ENSURE ENLIVE)  237 mL Oral BID BM  . feeding supplement (PRO-STAT SUGAR FREE 64)  30 mL Oral BID  . ferrous sulfate  325 mg Oral Q breakfast  . fluticasone  2 spray Each Nare Daily  . insulin aspart  0-15 Units Subcutaneous Q4H  . levothyroxine  100 mcg Oral QAC breakfast  . mouth rinse  15 mL Mouth Rinse BID  . multivitamin with minerals  1 tablet Oral Daily  . pantoprazole  40 mg Oral BID  . senna-docusate  1 tablet Oral BID  . sodium chloride flush  10-40 mL Intracatheter Q12H  . thiamine  100 mg Oral Daily    Infusions: . sodium chloride    . amiodarone 30 mg/hr (02/28/17 0816)  . cefTRIAXone (ROCEPHIN)  IV    . DOPamine 10 mcg/kg/min (02/28/17 0816)  . furosemide (LASIX) infusion 10 mg/hr (02/28/17 0800)  . milrinone 0.25 mcg/kg/min (02/28/17 0800)    PRN Medications: sodium chloride, acetaminophen, alum & mag hydroxide-simeth, camphor-menthol, guaiFENesin-dextromethorphan, iopamidol, lip balm, ondansetron (ZOFRAN) IV, promethazine, sodium chloride flush    Patient Profile  56 y/o woman with morbid obesity and little previous medical follow-up. Admitted 6/24 with severe anemia (hgb 3.7) and cardiogenic shock due biventricular HF R>L. Echo reviewed EF 30-35% with severe RV Failure and PAH. Has anasarca and progressive renal failure with inability to mobilize fluid. Transferred to [facility] on 7/4 for further evaluation by CHF team.    Assessment/Plan  1. 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 Low volts on ECG concerning for amyloid. Creatinine too high for CMRI.  Coox 81.7% this am on milrinone 0.25 mcg/kg/min and dopamine 10. No improvement in diuresis. Remains markedly volume overloaded. - Stop b-blocker. Continue lasix gtt. Renal function trending up. May need to add metolazone.  - HIV negative.  - Rhuematoid Factor negative. Hepatitis Panel negative.  SPEP and UPEP pending.  - Hepatitis panel negative.  -  Eventually will need VQ and R/L heart cath and sleep study  2. Iron deficiency anemia - Hgb on admit was 3.7. With low MCV. Iron stores low. -  EGD 02/20/17 with severe esophagitis  - Hgb 8.1 this am. No overt bleeding.  - Received feraheme 02/26/2017.  Continue PPI.  - Will need colonoscopy- ? Inflammatory bowel disease or colon CA  3. Atrial flutter - Currently rate controlled on amio. Will continue - Not on AC due to anemia.  -Will eventually need AC followed by DC-CV and possible ablation - Started SQ heparin on 02/26/2017. Stopped 02/27/17 with Hgb down to 7.3. SCDs R and LLE for DVT prophylaxis.   4. AKI  - Unclear baseline. Creatinine on admit was 1.9.  - Creatinine trending up to 2.46. Continue to follow closely. Suspect ATN - No blood or protein on UA - Will eventually need Renal US. No obstruction seen on CT.  5. Anasarca/ascites in setting of severe protein calorie malnutitions - CT abdomen reviewed personally shows marked hepatic steatosis - Likely due to combination of RHF and fatty liver. Albumin 2.7 02/18/17 - prealbumin was < 5 on 02/18/17. - Poor diuresis so far.    6. Hypothyroidism - Likely long-standing with spoon nails on exam. - TSH 11. Already started on synthroid - T3 1.3 T4 1.08  - Plasma cortisol 25.8 02/17/17   - Unclear what role this is playing in her cardiomyopathy. No change.   7. Leukocytosis -RLE wound does not appear infected.  Buttock wounds appear partial thickness with evidence of induration.  PCT only 1.44 02/27/17.  - UA 02/27/17 Cloudy with few bacteria and moderate leukocytes. Started back on Rocephin. - Complete course of antibiotics (ceftriaxone 6/24 - 7/2) transition to keflex to complete 10 days given persistent leukocytosis. Trending up. WBC up to 28.5 % this am. Tmax 98.4 - No evidence of malignancy on CT. No change.  - Peripheral smear sent. Results pending.  8. Severe fibroid uterus  9. Morbid obesity - Nutrition following. Body mass index is 43.29 kg/m. Prealbumin < 5.   10. RLE wound Continue foam dressing on RLE wound. No change.   11. Probable OSA/OHS - Bipap as needed. No change.   12-MASD (Moisture Associated Skin Damage) Buttock Wounds- Partial thickness- dose appear infected. Appears to be related to moisture and fecal incontinence.  Will likely need barrier cream as urine leaks under dressing. Prealbumin <5.  - WOC consult pending.  Remains tenuous. Urine output poor on pressor support. Creatinine gradually trending up. ? ATN from hypotension. Continue to follow closely. Will discuss diuretic regimen with MD. If remains poor, may need to involve renal. Could try high dose diuretics first.     Length of Stay: [place], Hershal Coria  02/28/2017, 8:48 AM  Advanced Heart Failure Team Pager [phone] (M-F; 7a - 4p)  Please contact [facility] for night-coverage after hours (4p -7a ) and weekends on [email or web address]  Agree with above.  Moved to ICU last night for progressive hypotension and fluid overload.  This morning, more SOB. + cough. Almost anuric depite IV lasix and milrinone + dopamine.   WBC climbing. Placed on ceftriaxone for UTI.   On exam mildly dyspneic JVP to ear Cor RRR distant Ab distended. NT EXT 3-4+ edema  She continues to deteriorate. Co-ox currently normal but has progressive renal failure so suspect she has ATN related to previous hypotension. Will need  CVVHD. Continue inotrope support. Discussed with CCM and Renal. Will get Trialysis catheter today an begin CVVHD. Prognosis guarded.  CRITICAL CARE Performed by: Glori Bickers  Total critical care time: 35 minutes  Critical care time was exclusive of separately billable  procedures and treating other patients.  Critical care was necessary to treat or prevent imminent or life-threatening deterioration.  Critical care was time spent personally by me (independent of midlevel providers or residents) on the following activities: development of treatment plan with patient and/or surrogate as well as nursing, discussions with consultants, evaluation of patient's response to treatment, examination of patient, obtaining history from patient or surrogate, ordering and performing treatments and interventions, ordering and review of laboratory studies, ordering and review of radiographic studies, pulse oximetry and re-evaluation of patient's condition.    Glori Bickers, MD  5:06 PM

## 2017-03-01 DIAGNOSIS — N179 Acute kidney failure, unspecified: Secondary | ICD-10-CM

## 2017-03-01 LAB — DIFFERENTIAL
BASOS PCT: 0 %
Basophils Absolute: 0 10*3/uL (ref 0.0–0.1)
Eosinophils Absolute: 0 10*3/uL (ref 0.0–0.7)
Eosinophils Relative: 0 %
LYMPHS ABS: 1.1 10*3/uL (ref 0.7–4.0)
LYMPHS PCT: 4 %
MONOS PCT: 5 %
Monocytes Absolute: 1.4 10*3/uL — ABNORMAL HIGH (ref 0.1–1.0)
NEUTROS ABS: 25.3 10*3/uL — AB (ref 1.7–7.7)
Neutrophils Relative %: 91 %

## 2017-03-01 LAB — RENAL FUNCTION PANEL
ALBUMIN: 2.2 g/dL — AB (ref 3.5–5.0)
ANION GAP: 9 (ref 5–15)
BUN: 32 mg/dL — ABNORMAL HIGH (ref 6–20)
CO2: 24 mmol/L (ref 22–32)
Calcium: 8.6 mg/dL — ABNORMAL LOW (ref 8.9–10.3)
Chloride: 104 mmol/L (ref 101–111)
Creatinine, Ser: 1.6 mg/dL — ABNORMAL HIGH (ref 0.44–1.00)
GFR, EST AFRICAN AMERICAN: 41 mL/min — AB (ref >60)
GFR, EST NON AFRICAN AMERICAN: 36 mL/min — AB (ref >60)
Glucose, Bld: 116 mg/dL — ABNORMAL HIGH (ref 65–99)
PHOSPHORUS: 3.1 mg/dL (ref 2.5–4.6)
POTASSIUM: 3.3 mmol/L — AB (ref 3.5–5.1)
Sodium: 137 mmol/L (ref 135–145)

## 2017-03-01 LAB — CBC
HEMATOCRIT: 27.8 % — AB (ref 36.0–46.0)
Hemoglobin: 8.3 g/dL — ABNORMAL LOW (ref 12.0–15.0)
MCH: 22.7 pg — AB (ref 26.0–34.0)
MCHC: 29.9 g/dL — AB (ref 30.0–36.0)
MCV: 76 fL — ABNORMAL LOW (ref 78.0–100.0)
Platelets: 336 10*3/uL (ref 150–400)
RBC: 3.66 MIL/uL — ABNORMAL LOW (ref 3.87–5.11)
RDW: 30.8 % — AB (ref 11.5–15.5)
WBC: 27.8 10*3/uL — ABNORMAL HIGH (ref 4.0–10.5)

## 2017-03-01 LAB — BASIC METABOLIC PANEL
Anion gap: 8 (ref 5–15)
BUN: 46 mg/dL — AB (ref 6–20)
CHLORIDE: 105 mmol/L (ref 101–111)
CO2: 23 mmol/L (ref 22–32)
Calcium: 8.9 mg/dL (ref 8.9–10.3)
Creatinine, Ser: 1.94 mg/dL — ABNORMAL HIGH (ref 0.44–1.00)
GFR, EST AFRICAN AMERICAN: 33 mL/min — AB (ref >60)
GFR, EST NON AFRICAN AMERICAN: 28 mL/min — AB (ref >60)
Glucose, Bld: 114 mg/dL — ABNORMAL HIGH (ref 65–99)
POTASSIUM: 3.3 mmol/L — AB (ref 3.5–5.1)
SODIUM: 136 mmol/L (ref 135–145)

## 2017-03-01 LAB — COOXEMETRY PANEL
Carboxyhemoglobin: 1.4 % (ref 0.5–1.5)
METHEMOGLOBIN: 1.4 % (ref 0.0–1.5)
O2 SAT: 87.5 %
TOTAL HEMOGLOBIN: 8.4 g/dL — AB (ref 12.0–16.0)

## 2017-03-01 LAB — GLUCOSE, CAPILLARY
GLUCOSE-CAPILLARY: 107 mg/dL — AB (ref 65–99)
GLUCOSE-CAPILLARY: 87 mg/dL (ref 65–99)
GLUCOSE-CAPILLARY: 110 mg/dL — AB (ref 65–99)
Glucose-Capillary: 111 mg/dL — ABNORMAL HIGH (ref 65–99)
Glucose-Capillary: 115 mg/dL — ABNORMAL HIGH (ref 65–99)
Glucose-Capillary: 122 mg/dL — ABNORMAL HIGH (ref 65–99)

## 2017-03-01 LAB — MAGNESIUM: Magnesium: 2.2 mg/dL (ref 1.7–2.4)

## 2017-03-01 LAB — PROCALCITONIN: Procalcitonin: 1.3 ng/mL

## 2017-03-01 LAB — PHOSPHORUS: Phosphorus: 3.5 mg/dL (ref 2.5–4.6)

## 2017-03-01 MED ORDER — HEPARIN SODIUM (PORCINE) 5000 UNIT/ML IJ SOLN
5000.0000 [IU] | Freq: Three times a day (TID) | INTRAMUSCULAR | Status: DC
  Administered 2017-03-01 – 2017-03-04 (×9): 5000 [IU] via SUBCUTANEOUS
  Filled 2017-03-01 (×9): qty 1

## 2017-03-01 NOTE — Progress Notes (Signed)
Advanced Heart Failure Rounding Note  PCP:  Primary Cardiologist: Dr Acie Fredrickson   Subjective:    02/26/17 started on milrinone and placed on lasix drip.   Evening of 02/27/17 pts pressures fell requiring more dopamine, became less responsive and UOP worsening. Pt transferred to [place].   CVVHD started 7/6 net -125/hr. Had 1.2L urine output overnight and 2.2L from [place]. Weigh down 7 pounds.  CVP 17-18. Co-ox 87% on  Milrinone 0.25 and dopamine 9.  Weak but feeling a bit better. Nausea resolved. No dyspnea    Objective:   Weight Range: 115.2 kg (253 lb 15.5 oz) Body mass index is 42.26 kg/m.   Vital Signs:   Temp:  [97.5 F (36.4 C)] 97.5 F (36.4 C) (07/07 0400) Pulse Rate:  [99-113] 103 (07/07 0700) Resp:  [9-28] 16 (07/07 0700) BP: (88-133)/(51-78) 109/67 (07/07 0700) SpO2:  [95 %-100 %] 97 % (07/07 0700) Weight:  [115.2 kg (253 lb 15.5 oz)] 115.2 kg (253 lb 15.5 oz) (07/07 0400) Last BM Date: 02/25/17  Weight change: Filed Weights   02/26/17 0500 02/28/17 0145 03/01/17 0400  Weight: 118.6 kg (261 lb 7.5 oz) 118 kg (260 lb 2.3 oz) 115.2 kg (253 lb 15.5 oz)    Intake/Output:   Intake/Output Summary (Last 24 hours) at 03/01/17 0759 Last data filed at 03/01/17 0700  Gross per 24 hour  Intake          1420.47 ml  Output             3729 ml  Net         -2308.53 ml     Physical Exam   CVP 18 General: Lying in bed NAD. Fatigued  HEENT: Normal Neck: Supple. JVP to jaw. RIJ trialysis cath  No thyromegaly or nodule noted. Cor: PMI nonpalpable. Tachy irregular, No M/G/R noted Lungs: Decreased BS anteriorly Abdomen: obese Soft, non-tender, nD. Good BS Extremities: No cyanosis or clubbing. Spoon nail 3-4+ edema  Neuro: alert & oriented x 3, cranial nerves grossly intact. moves all 4 extremities w/o difficulty. Affect pleasant GU: Foley + light yellow  urine Skin: RLE partial thickness wound 3x1.5 cm 100% pink. Dressing in place  Buttock with multiple partial thickness  wounds.    Telemetry   AFL 100-115 Personally reviewed   EKG   Previously viewed from 02/24/2017 A Flutter 100 bpm   Labs    CBC  Recent Labs  02/28/17 0352 03/01/17 0331  WBC 28.5* 27.8*  NEUTROABS 25.9* 25.3*  HGB 8.1* 8.3*  HCT 27.1* 27.8*  MCV 76.3* 76.0*  PLT 337 782   Basic Metabolic Panel  Recent Labs  02/28/17 0352 02/28/17 1656 03/01/17 0331  NA 136 136 136  K 3.5 3.5 3.3*  CL 105 102 105  CO2 21* 23 23  GLUCOSE 120* 117* 114*  BUN 58* 45* 46*  CREATININE 2.46* 1.89* 1.94*  CALCIUM 9.0 8.7* 8.9  MG 2.3  --  2.2  PHOS  --  3.2 3.5   Liver Function Tests  Recent Labs  02/28/17 1656  ALBUMIN 2.4*   No results for input(s): LIPASE, AMYLASE in the last 72 hours. Cardiac Enzymes No results for input(s): CKTOTAL, CKMB, CKMBINDEX, TROPONINI in the last 72 hours.  BNP: BNP (last 3 results)  Recent Labs  02/16/17 1516  BNP 923.6*    ProBNP (last 3 results) No results for input(s): PROBNP in the last 8760 hours.   D-Dimer No results for input(s): DDIMER in the last  72 hours. Hemoglobin A1C No results for input(s): HGBA1C in the last 72 hours. Fasting Lipid Panel No results for input(s): CHOL, HDL, LDLCALC, TRIG, CHOLHDL, LDLDIRECT in the last 72 hours. Thyroid Function Tests  Recent Labs  02/26/17 1831  T3FREE 1.3*    Other results:   Imaging    Dg Chest Port 1 View  Result Date: 02/28/2017 CLINICAL DATA:  Central line placement. EXAM: PORTABLE CHEST 1 VIEW COMPARISON:  02/27/2017 FINDINGS: Right internal jugular approach central venous catheter overlies the expected location of proximal superior vena cava. Unchanged appearance of the left internal jugular approach venous catheter which coils in the mediastinum, unchanged. The cardiac silhouette is enlarged. There is no evidence of pneumothorax. Bilateral patchy airspace consolidation in the lower lobes. Low lung volumes. Possible left pleural effusion. Osseous structures are  without acute abnormality. Soft tissues are grossly normal. IMPRESSION: Right internal jugular approach central venous catheter overlies expected location of proximal superior vena cava. Enlarged cardiac silhouette. Low lung volumes with bilateral lower lobe airspace opacity is versus atelectasis. Electronically Signed   By: Fidela Salisbury M.D.   On: 02/28/2017 15:17     Medications:     Scheduled Medications: . Chlorhexidine Gluconate Cloth  6 each Topical Daily  . feeding supplement (ENSURE ENLIVE)  237 mL Oral BID BM  . feeding supplement (PRO-STAT SUGAR FREE 64)  30 mL Oral BID  . ferrous sulfate  325 mg Oral Q breakfast  . fluticasone  2 spray Each Nare Daily  . insulin aspart  0-15 Units Subcutaneous Q4H  . levothyroxine  100 mcg Oral QAC breakfast  . mouth rinse  15 mL Mouth Rinse BID  . multivitamin with minerals  1 tablet Oral Daily  . pantoprazole  40 mg Oral BID  . senna-docusate  1 tablet Oral BID  . sodium chloride flush  10-40 mL Intracatheter Q12H  . thiamine  100 mg Oral Daily    Infusions: . sodium chloride    . amiodarone 30 mg/hr (03/01/17 0700)  . cefTRIAXone (ROCEPHIN)  IV Stopped (02/28/17 0939)  . DOPamine 9 mcg/kg/min (03/01/17 0700)  . heparin    . milrinone 0.25 mcg/kg/min (03/01/17 0700)  . dialysis replacement fluid (prismasate) 500 mL/hr at 03/01/17 0237  . dialysis replacement fluid (prismasate) 300 mL/hr at 02/28/17 1616  . dialysate (PRISMASATE) 1,000 mL/hr at 03/01/17 0738    PRN Medications: sodium chloride, acetaminophen, alum & mag hydroxide-simeth, camphor-menthol, guaiFENesin-dextromethorphan, heparin, heparin, iopamidol, lip balm, ondansetron (ZOFRAN) IV, promethazine, sodium chloride flush    Patient Profile  56 y/o woman with morbid obesity and little previous medical follow-up. Admitted 6/24 with severe anemia (hgb 3.7) and cardiogenic shock due biventricular HF R>L. Echo reviewed EF 30-35% with severe RV Failure and PAH. Has  anasarca and progressive renal failure with inability to mobilize fluid. Transferred to [facility] on 7/4 for further evaluation by CHF team.    Assessment/Plan  1. 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 Low volts on ECG concerning for amyloid. Creatinine too high for CMRI.  - Coox 81.7% this am on milrinone 0.25 mcg/kg/min and dopamine 9. MAPs 70s. Will wean milrinone to 0.125 - Improving with volume removal with CVVHD. Urine output also picking up. Continue CVVHD for massive volume overload - Off b-blocker/ACE/ARB due to acute decompensation -  Eventually will need VQ and  R heart cath and sleep study  2. AKI  - Unclear baseline. Creatinine on admit was 1.9.  - CVVHD started 7/6. Responding very well. Continue to remove fluid as she has massive voluem overload.  - D/w Dr. Augustin Coupe in Nephrology at bedside - No blood or protein on UA - Will eventually need Renal US. No obstruction seen on CT.  3. Iron deficiency anemia - Hgb on admit was 3.7. With low MCV. Iron stores low. - EGD 02/20/17 with severe esophagitis  - Hgb 8.3 this am. No overt bleeding.  - Received feraheme 02/26/2017.  Continue PPI.  - Will need colonoscopy eventually (as tolerated)  4. Atrial flutter - Rate elevated in setting of dopamine. Continue amio - Not on AC due to anemia.  -Will eventually need AC followed by DC-CV and possible ablation - Started SQ heparin on 02/26/2017. Stopped 02/27/17 with Hgb down to 7.3. SCDs R and LLE for DVT prophylaxis.    5. Anasarca/ascites in setting of severe protein calorie malnutitions - CT abdomen reviewed personally shows marked hepatic steatosis - Likely due to combination of RHF and fatty liver. Albumin 2.7 02/18/17 - prealbumin was < 5 on 02/18/17. - Responding well to CVVHD   6. Hypothyroidism - Likely  long-standing with spoon nails on exam. - TSH 11. Already started on synthroid - T3 1.3 T4 1.08  - Plasma cortisol 25.8 02/17/17   - Unclear what role this is playing in her cardiomyopathy. No change.   7. Leukocytosis - Slighty improved today with initiation of rocephin  -RLE wound does not appear infected. Buttock wounds appear partial thickness with evidence of induration.  PCT only 1.44 02/27/17.  - UA 02/27/17 Cloudy with few bacteria and moderate leukocytes. Started back on Rocephin. No Ucx avaialble - Complete course of antibiotics (ceftriaxone 6/24 - 7/2) transition to keflex to complete 10 days given persistent leukocytosis.  - No evidence of malignancy on CT. No change.  - Peripheral smear sent. Results pending. If WBC stays up will consult Heme  8. Severe fibroid uterus  9. Morbid obesity - Nutrition following. Body mass index is 42.26 kg/m. Prealbumin < 5.   10. RLE wound Continue foam dressing on RLE wound. No change.   11. Probable OSA/OHS - Bipap as needed. No change.   12-MASD (Moisture Associated Skin Damage) Buttock Wounds- Partial thickness- dose appear infected. Appears to be related to moisture and fecal incontinence.  Will likely need barrier cream as urine leaks under dressing. Prealbumin <5.  - WOC consult pending.  CRITICAL CARE Performed by: Glori Bickers  Total critical care time: 35 minutes  Critical care time was exclusive of separately billable procedures and treating other patients.  Critical care was necessary to treat or prevent imminent or life-threatening deterioration.  Critical care was time spent personally by me (independent of midlevel providers or residents) on the following activities: development of treatment plan with patient and/or surrogate as well as nursing, discussions with consultants, evaluation of patient's response to treatment, examination of patient, obtaining history from patient or surrogate, ordering and performing  treatments and interventions, ordering and review of laboratory studies, ordering and review of radiographic studies, pulse oximetry and re-evaluation of patient's condition.    Length of Stay: 13  Glori Bickers, MD  03/01/2017, 7:59 AM  Advanced Heart Failure Team Pager [phone] (M-F; [place])  Please contact [facility] for night-coverage after hours (4p -7a ) and weekends on [email or web address]

## 2017-03-01 NOTE — Progress Notes (Signed)
Patient ID: Samantha Richards, female   DOB: Jul 10, 1961, 56 y.o.   MRN: [id number]  PROGRESS NOTE    Samantha Richards  OFB:[id number] DOB: 02-Apr-1961 DOA: 02/16/2017  PCP: Patient, No Pcp Per   Brief Narrative:  56 y.o. female who presented on 6/24 after a fall at home found to be in hemorrhagic/hypovolemic and cardiogenic shock, severely anemic in the setting of slow GI bleed and Hgb 3.7. Transfusions and BiPAP provided in addition to vancomycin/ceftriaxone for cellulitis of the back. EGD 6/28 showed gastritis and esophagitis, and pt declined colonoscopy. Levophed was weaned 6/28 and the patient was transferred to hospitalist service 6/29. AFib with RVR has been managed by cardiology, and has converted to sinus rhythm. EF 30% with evidence of RV overload/cor pulmonale. She appears severely malnourished with anasarca with ascites, paracentesis 6/26 of 2.5L fluid with negative cytology and culture. Lasix infusion was started 7/2.  Per cardiology assessment, pt may benefit from milrinone drip and transferred to [facility] to be seen by Heart Failure Team.   Assessment & Plan:   Cardiogenic shock with acute systolic and diastolic CHF and RV overload and cor pulmonale - Off pressor support since 6/28 - Cortisol 25.8 - ECHO showed RV overload, diffusely hypokinetic and LVEF 30-35% - Pt has ongoing diffuse anasarca that is not improving much - Started on milrinone drip per HF team - Now on milrinone, dopamine and amiodarone drip - Pt has been started on hemodialysis - Pt remains full code, consider palliative medicine for goals of care discussions   Acute blood loss anemia / Acute upper GI bleed / hematemesis / Iron deficiency anemia - Symptomatic anemia with hgb of 3.7 on admission - Occult negative on admission - EGD 6/28 showed gastritis / esophagitis - GI signed off 6/29, recommended to call back when pt more stable and willing to undergo colonoscopy  - Continue Protonix 40 mg BID - Continue ferrous  sulfate supplementation   Ascites - SAAG > 1.1 - Negative SBP - S/P paracentesis  - Given increasing leukocytosis, start ceftriaxone to empirically cover for SBP  Hypokalemia - replaced orally.    CKD stage 3 - Cr continues to trend up, likely in the setting of IV lasix  - Per cardio, plan to switch to milrinone drip   Generalized weakness - In the setting of acute decompensated CHF - PT recommended SNF placement  Atrial fibrillation with RVR - CHADS vasc score at least 3 - On amiodarone drip, metoprolol 12.5 mg PO BID - Cardiology team following - Not on [facility] due to risk of GI bleed   Cellulitis, right lower extremity / Leukocytosis  - given worsening leukocytosis, DC cephalexin, restart ceftriaxone 7/6 - Vanco and rocephin stopped but planning to restart ceftriaxone 7/6.  - check portable CXR - urinalysis suggestive of UTI and was started on ceftriaxone 7/6  Leukocytosis - WBC slightly down today, UA done yesterday suspicious for UTI, also concerned about SBP given abdominal ascites, will go ahead and start back on IV ceftriaxone to cover for SBP and UTI.  Follow CBC with diff, follow cultures.  For any temp greater than 100.4, recommend repeating blood cultures.   Hypothyroidism - TSH 11.7 - Started levothyroxine, recommending to recheck TSH in 6-8 weeks  Obesity / Severe protein calorie malnutrition  - Counseled on diet - Seen by nutritionist   Stage 2 pressure skin injury to bilateral buttocks and right lower extremity  - Seen by wound care, appreciate their assessment - Bilateral buttocks 18 cm  x 30 cm with partial thickness tissue loss - Right lateral and posterior foot with 15 cm x 15 cm area of maceration with serous exudate - Added bariatric mattress, moisture barrier ointment   DVT prophylaxis: heparin Code Status: full code  Family Communication: no family at the bedside during rounds Disposition Plan: TBD    Consultants:   Cardiology, HF team  GI,  [facility]   Pulmonary / CCM   Nutrition  PT - SNF recommended   WOC  Procedures:   Paracentesis 6/26 - 2.5 L fluid removed   B/ LE venous doppler 6/25 - no DVT  EGD 6/28 - distal esophagitis and gastritis   Antimicrobials:   Vanco 6/24 --> 6/28  Rocephin 6/24 --> 7/2  Cephalexin 7/2 -->  Subjective: Pt transferred to ICU due to need for pressors, Pt complaining of headache this morning  Objective: Vitals:   03/01/17 0530 03/01/17 0600 03/01/17 0630 03/01/17 0700  BP: 98/63 103/67 105/61 109/67  Pulse: (!) 102 (!) 103 (!) 107 (!) 103  Resp: 14 17 15 16   Temp:      TempSrc:      SpO2: 99% 100% 100% 97%  Weight:      Height:        Intake/Output Summary (Last 24 hours) at 03/01/17 0806 Last data filed at 03/01/17 0700  Gross per 24 hour  Intake          1362.67 ml  Output             3669 ml  Net         -2306.33 ml   Filed Weights   02/26/17 0500 02/28/17 0145 03/01/17 0400  Weight: 118.6 kg (261 lb 7.5 oz) 118 kg (260 lb 2.3 oz) 115.2 kg (253 lb 15.5 oz)    Examination:  General exam: Appears calm and comfortable, NAD.  Respiratory system: Diminished breath sounds bilateral with bibasilar crackles, no wheezing  Cardiovascular system: irregular, S1 & S2 heard, Rate controlled, (+) JVD Gastrointestinal system: Abdomen is distended, soft and nontender. No organomegaly or masses felt. Normal bowel sounds heard. Central nervous system: Alert and oriented. No focal neurological deficits. Extremities: 2+ pitting edema B/L LE Skin: Right LE wound scaling open lesions cracked skin BLEs.  Psychiatry: normal affect.  Data Reviewed: I have personally reviewed following labs and imaging studies  CBC:  Recent Labs Lab 02/25/17 0305 02/26/17 0330 02/27/17 0309 02/28/17 0352 03/01/17 0331  WBC 21.0* 22.9* 22.0* 28.5* 27.8*  NEUTROABS  --   --   --  25.9* 25.3*  HGB 7.9* 8.1* 7.3* 8.1* 8.3*  HCT 26.9* 26.1* 24.5* 27.1* 27.8*  MCV 74.1* 73.9* 75.6* 76.3* 76.0*   PLT 267 299 271 337 751   Basic Metabolic Panel:  Recent Labs Lab 02/26/17 0330 02/27/17 0313 02/28/17 0352 02/28/17 1656 03/01/17 0331  NA 138 137 136 136 136  K 3.6 3.2* 3.5 3.5 3.3*  CL 107 108 105 102 105  CO2 20* 21* 21* 23 23  GLUCOSE 120* 110* 120* 117* 114*  BUN 56* 54* 58* 45* 46*  CREATININE 2.03* 2.11* 2.46* 1.89* 1.94*  CALCIUM 8.8* 8.6* 9.0 8.7* 8.9  MG  --  2.1 2.3  --  2.2  PHOS  --   --   --  3.2 3.5   GFR: Estimated Creatinine Clearance: 42.5 mL/min (A) (by C-G formula based on SCr of 1.94 mg/dL (H)). Liver Function Tests:  Recent Labs Lab 02/28/17 1656  ALBUMIN 2.4*  No results for input(s): LIPASE, AMYLASE in the last 168 hours. No results for input(s): AMMONIA in the last 168 hours. Coagulation Profile: No results for input(s): INR, PROTIME in the last 168 hours. Cardiac Enzymes: No results for input(s): CKTOTAL, CKMB, CKMBINDEX, TROPONINI in the last 168 hours. BNP (last 3 results) No results for input(s): PROBNP in the last 8760 hours. HbA1C: No results for input(s): HGBA1C in the last 72 hours. CBG:  Recent Labs Lab 02/28/17 0803 02/28/17 1145 02/28/17 1532 02/28/17 1942 03/01/17 0333  GLUCAP 112* 109* 119* 118* 107*   Lipid Profile: No results for input(s): CHOL, HDL, LDLCALC, TRIG, CHOLHDL, LDLDIRECT in the last 72 hours. Thyroid Function Tests:  Recent Labs  02/26/17 1831  FREET4 1.08  T3FREE 1.3*   Anemia Panel: No results for input(s): VITAMINB12, FOLATE, FERRITIN, TIBC, IRON, RETICCTPCT in the last 72 hours. Urine analysis:    Component Value Date/Time   COLORURINE AMBER (A) 02/27/2017 1900   APPEARANCEUR CLOUDY (A) 02/27/2017 1900   LABSPEC 1.020 02/27/2017 1900   PHURINE 5.0 02/27/2017 1900   GLUCOSEU NEGATIVE 02/27/2017 1900   HGBUR SMALL (A) 02/27/2017 1900   BILIRUBINUR NEGATIVE 02/27/2017 1900   KETONESUR NEGATIVE 02/27/2017 1900   PROTEINUR 30 (A) 02/27/2017 1900   NITRITE NEGATIVE 02/27/2017 1900    LEUKOCYTESUR MODERATE (A) 02/27/2017 1900    No results found for this or any previous visit (from the past 240 hour(s)).  Radiology Studies: Dg Chest Port 1 View Result Date: 02/22/2017 Probable bibasilar atelectasis.   Scheduled Meds: . Chlorhexidine Gluconate Cloth  6 each Topical Daily  . feeding supplement (ENSURE ENLIVE)  237 mL Oral BID BM  . feeding supplement (PRO-STAT SUGAR FREE 64)  30 mL Oral BID  . ferrous sulfate  325 mg Oral Q breakfast  . fluticasone  2 spray Each Nare Daily  . insulin aspart  0-15 Units Subcutaneous Q4H  . levothyroxine  100 mcg Oral QAC breakfast  . mouth rinse  15 mL Mouth Rinse BID  . multivitamin with minerals  1 tablet Oral Daily  . pantoprazole  40 mg Oral BID  . senna-docusate  1 tablet Oral BID  . sodium chloride flush  10-40 mL Intracatheter Q12H  . thiamine  100 mg Oral Daily   Continuous Infusions: . sodium chloride    . amiodarone 30 mg/hr (03/01/17 0700)  . cefTRIAXone (ROCEPHIN)  IV Stopped (02/28/17 0939)  . DOPamine 9 mcg/kg/min (03/01/17 0700)  . heparin    . milrinone 0.25 mcg/kg/min (03/01/17 0700)  . dialysis replacement fluid (prismasate) 500 mL/hr at 03/01/17 0237  . dialysis replacement fluid (prismasate) 300 mL/hr at 02/28/17 1616  . dialysate (PRISMASATE) 1,000 mL/hr at 03/01/17 0738     LOS: 13 days   Critical Care Time spent: 31 minutes  Greater than 50% of the time spent on counseling and coordinating the care.  Irwin Brakeman, MD [facility] Pager [phone]  If 7PM-7AM, please contact night-coverage [email or web address] Password [email or web address] 03/01/2017, 8:06 AM

## 2017-03-01 NOTE — Progress Notes (Signed)
PULMONARY / CRITICAL CARE MEDICINE   Name: Samantha Richards MRN: [id number] DOB: 20-Nov-1960    ADMISSION DATE:  02/16/2017 CONSULTATION DATE:  02/27/17  REFERRING MD:  Dr. Wynetta Emery  CHIEF COMPLAINT:  Hypotension  BRIEF SUMMARY:   Samantha Richards is a 56 y.o. female who presented to [facility] on 6/24 after a fall at home and was found to be in hemorrhagic/hypovolemic and cardiogenic shock. She was severely anemic to Hgb 3.7 in the setting of GI bleed. Requiring transfusions, pressors and BiPAP in ICU. She was also treated for cellulitis of the back. Levophed was weaned 6/28 and the patient was transferred to hospitalist service 6/29. Cardiology has been managing AFib with RVR with amiodarone gtt and biventricular heart failure R>L with lasix gtt. She had cardiogenic shock prompting transfer to [facility] on 7/4 for further evaluation by CHF team and initiation of milrinone and dopamine gtt. She also has anasarca and progressive renal failure with oliguria and hypotension.  Nephrology consulted, CVVHD initiated 7/6.    SUBJECTIVE:  Pt reports feeling much better than yesterday.  On CVVHD.  Net neg 16ml/hr.  2.3L removed in last 24 hours but remains in positive balance.  Remains on amiodarone 30 mg, dopamine 9 mcg, milrinone 0.12 mg/kg,.  SVO2 87.5.  Weight down 7lbs.  Nausea resolved.    VITAL SIGNS: BP 109/67   Pulse (!) 103   Temp (!) 97.5 F (36.4 C) (Oral)   Resp 16   Ht 5\' 5"  (1.651 m)   Wt 253 lb 15.5 oz (115.2 kg) Comment: Simultaneous filing. User may not have seen previous data.  LMP 01/16/2017 Comment: neg preg 02-16-2017  SpO2 97%   BMI 42.26 kg/m   HEMODYNAMICS: CVP:  [16 mmHg-26 mmHg] 17 mmHg  VENTILATOR SETTINGS:    INTAKE / OUTPUT: I/O last 3 completed shifts: In: 1997 [P.O.:240; I.V.:1757] Out: 2706 [CBJSE:8315; Emesis/NG output:250; Other:2212]  PHYSICAL EXAMINATION: General: chronically ill appearing female in NAD in bed HEENT: MM pink/moist, JVD+ to jaw, R IJ HD, LIJ  TLC PSY: pleasant / calm, appropriate  Neuro: AAOx4, speech clear, MAE / generalized weakness  CV: s1s2 rrr, no m/r/g PULM: even/non-labored, lungs bilaterally diminished bases VV:OHYW, non-tender, bsx4 active  Extremities: warm/dry, anasarca  Skin: no rashes or lesions   LABS:  BMET  Recent Labs Lab 02/28/17 0352 02/28/17 1656 03/01/17 0331  NA 136 136 136  K 3.5 3.5 3.3*  CL 105 102 105  CO2 21* 23 23  BUN 58* 45* 46*  CREATININE 2.46* 1.89* 1.94*  GLUCOSE 120* 117* 114*    Electrolytes  Recent Labs Lab 02/27/17 0313 02/28/17 0352 02/28/17 1656 03/01/17 0331  CALCIUM 8.6* 9.0 8.7* 8.9  MG 2.1 2.3  --  2.2  PHOS  --   --  3.2 3.5    CBC  Recent Labs Lab 02/27/17 0309 02/28/17 0352 03/01/17 0331  WBC 22.0* 28.5* 27.8*  HGB 7.3* 8.1* 8.3*  HCT 24.5* 27.1* 27.8*  PLT 271 337 336    Coag's No results for input(s): APTT, INR in the last 168 hours.  Sepsis Markers  Recent Labs Lab 02/24/17 0527 02/27/17 2338 02/27/17 2347 02/28/17 0353 03/01/17 0331  LATICACIDVEN  --   --  0.8 0.8  --   PROCALCITON 0.35 1.44  --   --  1.30    ABG  Recent Labs Lab 02/28/17 0100  PHART 7.363  PCO2ART 36.2  PO2ART 53.9*    Liver Enzymes  Recent Labs Lab 02/28/17 1656  ALBUMIN 2.4*    Cardiac Enzymes No results for input(s): TROPONINI, PROBNP in the last 168 hours.  Glucose  Recent Labs Lab 02/28/17 0404 02/28/17 0803 02/28/17 1145 02/28/17 1532 02/28/17 1942 03/01/17 0333  GLUCAP 120* 112* 109* 119* 118* 107*    Imaging Dg Chest Port 1 View  Result Date: 02/28/2017 CLINICAL DATA:  Central line placement. EXAM: PORTABLE CHEST 1 VIEW COMPARISON:  02/27/2017 FINDINGS: Right internal jugular approach central venous catheter overlies the expected location of proximal superior vena cava. Unchanged appearance of the left internal jugular approach venous catheter which coils in the mediastinum, unchanged. The cardiac silhouette is enlarged.  There is no evidence of pneumothorax. Bilateral patchy airspace consolidation in the lower lobes. Low lung volumes. Possible left pleural effusion. Osseous structures are without acute abnormality. Soft tissues are grossly normal. IMPRESSION: Right internal jugular approach central venous catheter overlies expected location of proximal superior vena cava. Enlarged cardiac silhouette. Low lung volumes with bilateral lower lobe airspace opacity is versus atelectasis. Electronically Signed   By: Fidela Salisbury M.D.   On: 02/28/2017 15:17     STUDIES:  XR right Knee 6/24 >> No Acute CT abd/pelvis 6/25 >> anasarca with b/l pleural effusions, ascities. Fibroid uterus. Cardiomegaly with severe R atrial dilation. TTE 6/25 >> EF30-35%, diffuse hypokinesis, LA dilated, mild MR, RA mod dilated with signs of significant cor pulmonale. LE venous doppler 6/25 >> no DVT or SVT b/l US Paracentesis 6/26 >> 2.5 L peritoneal fluid yielded, cytology negative EGD 6/28 >> intense distal esophagitis with focal hemorrhagic hemorrhage, small hiatal hernia CXR 7/5 >> Cardiomegaly with vascular congestion, R midlung atelectasis with LLL opacity.  CULTURES: MRSA PCR neg 6/24 Blood cultures 6/24 > 1 of 2 coag neg staph, likely contaminate Peritoneal fluid culture 6/26 > No growth 5 days, neg for AFB HIV 6/29 > negative  ANTIBIOTICS: Vancomycin 6/24 > 6/28 Rocephin 6/25 > 7/2 Keflex 7/2 > 7/4 Rocephin 7/6 >>  SIGNIFICANT EVENTS: 6/24  Presents to [facility], admitted to ICU  6/29  Off vasopressors, transferred out to SDU 7/02  Lasix gtt initiated 7/04  Transferred to [facility]. Milrinone started 7/05  Dopamine started, transfer to ICU 7/06  Diffuse pain, requires pressor support with dopamine and a lasix drip.  Nausea.  CVVHD initiated.  7/07  Removed 2.3L in last 24 hours, remains positive balance.    LINES/TUBES: L IJ CVL 6/26 >> Foley 6/24 >> R IJ HD 7/6 >>   DISCUSSION: 56 y.o. female who presented after a  fall at home found to be in hemorrhagic/hypovolemic with severe anemia and cardiogenic shock due to biventricular heart failure. Now with worsening anasarca and progressive renal failure with oliguria and hypotension.  CVVHD initiated 7/6.    ASSESSMENT / PLAN:  PULMONARY A: Suspected OHS/OSA - intolerant of CPAP while inpatient (may have been related to fluid status) P:   Monitor in ICU  Wean O2 for sats  Pulmonary hygiene - IS, mobilize  CARDIOVASCULAR A:  Acute systolic heart failure with RV overload/cor pulmonale  Afib with RVR, improved Cardiogenic shock  P:  Cardiology / CHF following  Tele monitoring  CVP Q Shift, daily weights MAP goal >65  Amiodarone, Dopamine, Milrinone per Cardiology Fluid / Na restriction  Will likely need R/L heart cath, defer timing to cardiolgoy  RENAL A:   Acute Kidney Injury Oliguria P:  CVVHD per Nephrology  Trend BMP / urinary output Replace electrolytes as indicated Avoid nephrotoxic agents as able, ensure adequate renal perfusion  GASTROINTESTINAL A:   Upper GI bleed on EGD Severe protein-calorie malnutrition with kwashiokor Hepatic steatosis Ascites > cytology negative Peritoneal mets vs uterine fibroids P:   BID PPI  Nutrition following, promote adequate nutrition   HEMATOLOGIC A:   Anemia, likely multifactorial from GI bleed and chronic malnutrition P:  Trend CBC  Transfuse for Hgb <7 Continue Iron Peripheral smear with elliptocytes, polychromasia SCD's for DVT prophylaxis  Consider initiation of SQ heparin Hold heparin with GIB  INFECTIOUS A:   Skin breakdown/wounds Cellulitis RLE Stage II Decubitus to Bilateral Buttock, RLE UTI  P:   Wound care for BLE Monitor skin break down from severe anasarca Rocephin as above, D2/x  ENDOCRINE A:   Mild hyperglycemia Hypothyroid P:   Continue synthroid  Will need follow up TSH in 6 weeks > around 8/1  SSI   NEUROLOGIC A:   Severe Deconditioning  P:   PT  efforts   FAMILY  - Updates: Patient updated on plan of care.  She does not have family here locally.  She does not have children.  Patient is unsure of who should make decisions for her in the event she could not.     [name], NP-C [facility] Pgr: [phone] or if no answer [phone] 03/01/2017, 8:19 AM

## 2017-03-01 NOTE — Progress Notes (Signed)
[facility] Progress Note    Assessment/ Plan:   1. Acute Kidney Injury - May be in ATN associated with the heart failure + poor perfusion leading to cardiorenal syndrome, but regardless with the lack of improvement despite appropriate therapy and decreased UOP on Lasix gtt + over 4kg up I agree that we should initiate CVVHD. She does endorse being able to ambulate less than a month ago and I agree we should give her a chance with RRT to see if she recovers. She's certainly not a candidate for iHD if her cardiac function does not improve. - Started  CVVHD on 7/6 --> tolerated uptitration of net UF from 89ml/hr to now 142ml/hr. She continues to be on Amio, Dopamine and Milrinone. Will continue at 149ml/hr (net) as tolerated especially given that the UOP increased (1255ml /24hr) to help maintain renal perfusion. - Appreciate Dr. Nelda Marseille promptly seeing the pt and taking care of the catheter placement. - Seen on CVVHD @8am   500/300/1000 on 4K standard bath w/ net UF of 169ml/hr 2. 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 Iron deficiency anemia s/p Feraheme 02/26/2017 + transfusions. 4. Hypothyroidism started on Synthroid 5. Obesity.  Subjective:   Feeling somewhat better with improved breathing, denies CP. Some nausea yesterday has resolved. No f/c.   Objective:   BP 109/67   Pulse (!) 103   Temp (!) 97.5 F (36.4 C) (Oral)   Resp 16   Ht 5\' 5"  (1.651 m)   Wt 115.2 kg (253 lb 15.5 oz) Comment: Simultaneous filing. User may not have seen previous data.  LMP 01/16/2017 Comment: neg preg 02-16-2017  SpO2 97%   BMI 42.26 kg/m   Intake/Output Summary (Last 24 hours) at 03/01/17 0759 Last data filed at 03/01/17 0700  Gross per 24 hour  Intake          1420.47 ml  Output             3729 ml  Net         -2308.53 ml   Weight change: -2.8 kg (-6 lb 2.8 oz)  Physical Exam: General appearance: alert, cooperative and appears stated age Resp: rales bibasilar  and bilaterally Cardio: tachy GI: soft, non-tender; bowel sounds normal; no masses,  no organomegaly Extremities: edema diffuse 2-3+ Pulses: 2+ and symmetric Neurologic: Grossly normal  Imaging: Dg Chest Port 1 View  Result Date: 02/28/2017 CLINICAL DATA:  Central line placement. EXAM: PORTABLE CHEST 1 VIEW COMPARISON:  02/27/2017 FINDINGS: Right internal jugular approach central venous catheter overlies the expected location of proximal superior vena cava. Unchanged appearance of the left internal jugular approach venous catheter which coils in the mediastinum, unchanged. The cardiac silhouette is enlarged. There is no evidence of pneumothorax. Bilateral patchy airspace consolidation in the lower lobes. Low lung volumes. Possible left pleural effusion. Osseous structures are without acute abnormality. Soft tissues are grossly normal. IMPRESSION: Right internal jugular approach central venous catheter overlies expected location of proximal superior vena cava. Enlarged cardiac silhouette. Low lung volumes with bilateral lower lobe airspace opacity is versus atelectasis. Electronically Signed   By: Fidela Salisbury M.D.   On: 02/28/2017 15:17   Dg Chest Port 1 View  Result Date: 02/27/2017 CLINICAL DATA:  Leukocytosis EXAM: PORTABLE CHEST 1 VIEW COMPARISON:  02/22/2017 FINDINGS: Left central line coils in the mediastinum, possibly within the azygos vein. Cardiomegaly with vascular congestion. Low lung volumes. Left lower lobe atelectasis or infiltrate. Right midlung atelectasis. No visible effusions. IMPRESSION:  Cardiomegaly with vascular congestion. Right midlung atelectasis with left lower lobe airspace opacity which could reflect atelectasis or infiltrate. Left central line possibly within the azygos vein. Electronically Signed   By: Rolm Baptise M.D.   On: 02/27/2017 08:57    Labs: BMET  Recent Labs Lab 02/24/17 0527 02/25/17 0305 02/26/17 0330 02/27/17 0313 02/28/17 0352 02/28/17 1656  03/01/17 0331  NA 137 139 138 137 136 136 136  K 4.0 4.1 3.6 3.2* 3.5 3.5 3.3*  CL 109 110 107 108 105 102 105  CO2 19* 20* 20* 21* 21* 23 23  GLUCOSE 98 104* 120* 110* 120* 117* 114*  BUN 54* 56* 56* 54* 58* 45* 46*  CREATININE 1.85* 1.88* 2.03* 2.11* 2.46* 1.89* 1.94*  CALCIUM 8.7* 8.7* 8.8* 8.6* 9.0 8.7* 8.9  PHOS  --   --   --   --   --  3.2 3.5   CBC  Recent Labs Lab 02/26/17 0330 02/27/17 0309 02/28/17 0352 03/01/17 0331  WBC 22.9* 22.0* 28.5* 27.8*  NEUTROABS  --   --  25.9* 25.3*  HGB 8.1* 7.3* 8.1* 8.3*  HCT 26.1* 24.5* 27.1* 27.8*  MCV 73.9* 75.6* 76.3* 76.0*  PLT 299 271 337 336    Medications:    . Chlorhexidine Gluconate Cloth  6 each Topical Daily  . feeding supplement (ENSURE ENLIVE)  237 mL Oral BID BM  . feeding supplement (PRO-STAT SUGAR FREE 64)  30 mL Oral BID  . ferrous sulfate  325 mg Oral Q breakfast  . fluticasone  2 spray Each Nare Daily  . insulin aspart  0-15 Units Subcutaneous Q4H  . levothyroxine  100 mcg Oral QAC breakfast  . mouth rinse  15 mL Mouth Rinse BID  . multivitamin with minerals  1 tablet Oral Daily  . pantoprazole  40 mg Oral BID  . senna-docusate  1 tablet Oral BID  . sodium chloride flush  10-40 mL Intracatheter Q12H  . thiamine  100 mg Oral Daily      Otelia Santee, MD 03/01/2017, 7:59 AM

## 2017-03-02 ENCOUNTER — Encounter (HOSPITAL_COMMUNITY): Payer: Self-pay | Admitting: Family Medicine

## 2017-03-02 LAB — POCT ACTIVATED CLOTTING TIME
ACTIVATED CLOTTING TIME: 147 s
ACTIVATED CLOTTING TIME: 153 s
ACTIVATED CLOTTING TIME: 164 s
ACTIVATED CLOTTING TIME: 164 s
ACTIVATED CLOTTING TIME: 158 s
ACTIVATED CLOTTING TIME: 164 s
ACTIVATED CLOTTING TIME: 164 s
Activated Clotting Time: 136 seconds
Activated Clotting Time: 147 seconds
Activated Clotting Time: 147 seconds
Activated Clotting Time: 158 seconds
Activated Clotting Time: 158 seconds
Activated Clotting Time: 175 seconds
Activated Clotting Time: 180 seconds
Activated Clotting Time: 180 seconds

## 2017-03-02 LAB — CBC
HEMATOCRIT: 28.1 % — AB (ref 36.0–46.0)
Hemoglobin: 8.4 g/dL — ABNORMAL LOW (ref 12.0–15.0)
MCH: 23.2 pg — ABNORMAL LOW (ref 26.0–34.0)
MCHC: 29.9 g/dL — AB (ref 30.0–36.0)
MCV: 77.6 fL — ABNORMAL LOW (ref 78.0–100.0)
Platelets: 298 10*3/uL (ref 150–400)
RBC: 3.62 MIL/uL — ABNORMAL LOW (ref 3.87–5.11)
RDW: 31 % — AB (ref 11.5–15.5)
WBC: 21.4 10*3/uL — AB (ref 4.0–10.5)

## 2017-03-02 LAB — RENAL FUNCTION PANEL
ALBUMIN: 2.3 g/dL — AB (ref 3.5–5.0)
ALBUMIN: 2.2 g/dL — AB (ref 3.5–5.0)
ANION GAP: 8 (ref 5–15)
ANION GAP: 5 (ref 5–15)
BUN: 28 mg/dL — ABNORMAL HIGH (ref 6–20)
BUN: 23 mg/dL — AB (ref 6–20)
CALCIUM: 8.6 mg/dL — AB (ref 8.9–10.3)
CHLORIDE: 105 mmol/L (ref 101–111)
CO2: 24 mmol/L (ref 22–32)
CO2: 26 mmol/L (ref 22–32)
Calcium: 8.4 mg/dL — ABNORMAL LOW (ref 8.9–10.3)
Chloride: 103 mmol/L (ref 101–111)
Creatinine, Ser: 1.36 mg/dL — ABNORMAL HIGH (ref 0.44–1.00)
Creatinine, Ser: 1.17 mg/dL — ABNORMAL HIGH (ref 0.44–1.00)
GFR calc Af Amer: 50 mL/min — ABNORMAL LOW (ref >60)
GFR calc Af Amer: >60 mL/min (ref >60)
GFR calc non Af Amer: 44 mL/min — ABNORMAL LOW (ref >60)
GFR calc non Af Amer: 52 mL/min — ABNORMAL LOW (ref >60)
GLUCOSE: 113 mg/dL — AB (ref 65–99)
GLUCOSE: 113 mg/dL — AB (ref 65–99)
PHOSPHORUS: 2.6 mg/dL (ref 2.5–4.6)
PHOSPHORUS: 2.1 mg/dL — AB (ref 2.5–4.6)
POTASSIUM: 3.3 mmol/L — AB (ref 3.5–5.1)
Potassium: 3.4 mmol/L — ABNORMAL LOW (ref 3.5–5.1)
SODIUM: 135 mmol/L (ref 135–145)
Sodium: 136 mmol/L (ref 135–145)

## 2017-03-02 LAB — GLUCOSE, CAPILLARY
GLUCOSE-CAPILLARY: 109 mg/dL — AB (ref 65–99)
Glucose-Capillary: 101 mg/dL — ABNORMAL HIGH (ref 65–99)
Glucose-Capillary: 110 mg/dL — ABNORMAL HIGH (ref 65–99)
Glucose-Capillary: 102 mg/dL — ABNORMAL HIGH (ref 65–99)
Glucose-Capillary: 106 mg/dL — ABNORMAL HIGH (ref 65–99)

## 2017-03-02 LAB — MAGNESIUM: Magnesium: 2.3 mg/dL (ref 1.7–2.4)

## 2017-03-02 LAB — COOXEMETRY PANEL
CARBOXYHEMOGLOBIN: 1.2 % (ref 0.5–1.5)
METHEMOGLOBIN: 1.2 % (ref 0.0–1.5)
O2 Saturation: 98.6 %
TOTAL HEMOGLOBIN: 8.3 g/dL — AB (ref 12.0–16.0)

## 2017-03-02 LAB — DIFFERENTIAL
BASOS ABS: 0 10*3/uL (ref 0.0–0.1)
Basophils Relative: 0 %
EOS ABS: 0.4 10*3/uL (ref 0.0–0.7)
Eosinophils Relative: 2 %
LYMPHS ABS: 1.3 10*3/uL (ref 0.7–4.0)
Lymphocytes Relative: 6 %
MONOS PCT: 4 %
Monocytes Absolute: 0.9 10*3/uL (ref 0.1–1.0)
NEUTROS ABS: 18.8 10*3/uL — AB (ref 1.7–7.7)
NEUTROS PCT: 88 %

## 2017-03-02 MED ORDER — HEPARIN BOLUS VIA INFUSION (CRRT)
1000.0000 [IU] | INTRAVENOUS | Status: DC | PRN
  Filled 2017-03-02 (×2): qty 1000

## 2017-03-02 MED ORDER — SODIUM CHLORIDE 0.9 % IJ SOLN
250.0000 [IU]/h | INTRAMUSCULAR | Status: DC
  Administered 2017-03-02: 1850 [IU]/h via INTRAVENOUS_CENTRAL
  Administered 2017-03-02: 250 [IU]/h via INTRAVENOUS_CENTRAL
  Administered 2017-03-02: 1350 [IU]/h via INTRAVENOUS_CENTRAL
  Administered 2017-03-03 (×4): 1950 [IU]/h via INTRAVENOUS_CENTRAL
  Administered 2017-03-04: 1900 [IU]/h via INTRAVENOUS_CENTRAL
  Administered 2017-03-04: 1800 [IU]/h via INTRAVENOUS_CENTRAL
  Administered 2017-03-04: 1400 [IU]/h via INTRAVENOUS_CENTRAL
  Administered 2017-03-04: 1450 [IU]/h via INTRAVENOUS_CENTRAL
  Administered 2017-03-05 (×2): 1650 [IU]/h via INTRAVENOUS_CENTRAL
  Administered 2017-03-05: 1800 [IU]/h via INTRAVENOUS_CENTRAL
  Administered 2017-03-05: 1900 [IU]/h via INTRAVENOUS_CENTRAL
  Administered 2017-03-06 (×4): 1800 [IU]/h via INTRAVENOUS_CENTRAL
  Administered 2017-03-07: 1900 [IU]/h via INTRAVENOUS_CENTRAL
  Administered 2017-03-07: 1800 [IU]/h via INTRAVENOUS_CENTRAL
  Administered 2017-03-07 (×2): 1900 [IU]/h via INTRAVENOUS_CENTRAL
  Administered 2017-03-07: 1800 [IU]/h via INTRAVENOUS_CENTRAL
  Administered 2017-03-08: 1900 [IU]/h via INTRAVENOUS_CENTRAL
  Administered 2017-03-08: 1800 [IU]/h via INTRAVENOUS_CENTRAL
  Filled 2017-03-02 (×38): qty 2

## 2017-03-02 NOTE — Progress Notes (Signed)
Patient ID: Samantha Richards, female   DOB: [birth year], 56 y.o.   MRN: [id number]  PROGRESS NOTE  Samantha Richards  SWF:[id number] DOB: 1961/04/30 DOA: 02/16/2017  PCP: Patient, No Pcp Per  Brief Narrative:  56 y.o. female who presented on 6/24 after a fall at home found to be in hemorrhagic/hypovolemic and cardiogenic shock, severely anemic in the setting of slow GI bleed and Hgb 3.7. Transfusions and BiPAP provided in addition to vancomycin/ceftriaxone for cellulitis of the back. EGD 6/28 showed gastritis and esophagitis, and pt declined colonoscopy. Levophed was weaned 6/28 and the patient was transferred to hospitalist service 6/29. AFib with RVR has been managed by cardiology, and has converted to sinus rhythm. EF 30% with evidence of RV overload/cor pulmonale. She appears severely malnourished with anasarca with ascites, paracentesis 6/26 of 2.5L fluid with negative cytology and culture. Lasix infusion was started 7/2.  Per cardiology assessment, pt may benefit from milrinone drip and transferred to [facility] to be seen by Heart Failure Team.   Assessment & Plan:   Cardiogenic shock with acute systolic and diastolic CHF and RV overload and cor pulmonale - Off pressor support since 6/28 - Cortisol 25.8 - ECHO showed RV overload, diffusely hypokinetic and LVEF 30-35% - Pt has ongoing diffuse anasarca that is not improving much - Started on milrinone drip per HF team - Now on milrinone, dopamine and amiodarone drip - Pt tolerating hemodialysis - Pt remains full code  Acute blood loss anemia / Acute upper GI bleed / hematemesis / Iron deficiency anemia - Symptomatic anemia with hgb of 3.7 on admission - Occult negative on admission - EGD 6/28 showed gastritis / esophagitis - GI signed off 6/29, recommended to call back when pt more stable and willing to undergo colonoscopy  - Continue Protonix 40 mg BID - Continue ferrous sulfate supplementation   Ascites - SAAG > 1.1 - Negative SBP - S/P  paracentesis  - Given increasing leukocytosis, started ceftriaxone to empirically cover for SBP  Hypokalemia - replaced orally.    CKD stage 3 - nephrology started patient on hemodialysis   Generalized weakness - In the setting of acute decompensated CHF - PT recommended SNF placement  Atrial fibrillation with RVR - CHADS vasc score at least 3 - On amiodarone drip, metoprolol 12.5 mg PO BID - Cardiology team following - Not on [facility] due to risk of GI bleed   Cellulitis, right lower extremity / Leukocytosis  - given worsening leukocytosis, DC cephalexin, restart ceftriaxone 7/6 - Vanco and rocephin stopped but planning to restart ceftriaxone 7/6.  - urinalysis suggestive of UTI and was started on ceftriaxone 7/6  Leukocytosis - WBC trending down, concerned about SBP given abdominal ascites, continue IV ceftriaxone to cover for SBP and UTI.  Follow CBC with diff, follow cultures.  For any temp greater than 100.4, recommend repeating blood cultures.   Hypothyroidism - TSH 11.7 - Started levothyroxine, recommending to recheck TSH in 6-8 weeks  Obesity / Severe protein calorie malnutrition  - Counseled on diet - Seen by nutritionist   Stage 2 pressure skin injury to bilateral buttocks and right lower extremity  - Seen by wound care, appreciate their assessment - Bilateral buttocks 18 cm x 30 cm with partial thickness tissue loss - Right lateral and posterior foot with 15 cm x 15 cm area of maceration with serous exudate - Added bariatric mattress, moisture barrier ointment   DVT prophylaxis: heparin Code Status: full code  Family Communication: no family at the bedside  during rounds Disposition Plan: TBD    Consultants:   Cardiology, HF team  GI, [facility]   Pulmonary / CCM   Nutrition  PT - SNF recommended   [facility]  Nephrology  Procedures:   Paracentesis 6/26 - 2.5 L fluid removed   B/ LE venous doppler 6/25 - no DVT  EGD 6/28 - distal esophagitis and gastritis    Antimicrobials:   Vanco 6/24 --> 6/28  Rocephin 6/24 --> 7/2, 7/4-->  Cephalexin 7/2 -->7/4  Subjective: Pt says that she is feeling so much better, her headache is resolved now.   Objective: Vitals:   03/02/17 0400 03/02/17 0500 03/02/17 0600 03/02/17 0700  BP: 99/69 95/68 92/61  91/67  Pulse: (!) 102 (!) 102 95 (!) 102  Resp: 15 10 16 15   Temp: (!) 97.1 F (36.2 C)     TempSrc: Oral     SpO2: 100% 96% 95% 97%  Weight:      Height:        Intake/Output Summary (Last 24 hours) at 03/02/17 0719 Last data filed at 03/02/17 0700  Gross per 24 hour  Intake          1077.41 ml  Output             4169 ml  Net         -3091.59 ml   Filed Weights   02/28/17 0145 03/01/17 0400 03/02/17 0300  Weight: 118 kg (260 lb 2.3 oz) 115.2 kg (253 lb 15.5 oz) 110.9 kg (244 lb 7.8 oz)    Examination:  General exam: Appears calm and comfortable, NAD.  Respiratory system: Diminished breath sounds bilateral with bibasilar crackles, no wheezing  Cardiovascular system: irregular, S1 & S2 heard, Rate controlled, (+) JVD Gastrointestinal system: Abdomen is distended, soft and nontender. No organomegaly or masses felt. Normal bowel sounds heard. Central nervous system: Alert and oriented. No focal neurological deficits. Extremities: 2+ pitting edema B/L LE Skin: cracked scaling skin BLEs with chronic venous stasis changes.  Psychiatry: flat affect.  Data Reviewed: I have personally reviewed following labs and imaging studies  CBC:  Recent Labs Lab 02/26/17 0330 02/27/17 0309 02/28/17 0352 03/01/17 0331 03/02/17 0424  WBC 22.9* 22.0* 28.5* 27.8* 21.4*  NEUTROABS  --   --  25.9* 25.3* 18.8*  HGB 8.1* 7.3* 8.1* 8.3* 8.4*  HCT 26.1* 24.5* 27.1* 27.8* 28.1*  MCV 73.9* 75.6* 76.3* 76.0* 77.6*  PLT 299 271 337 336 176   Basic Metabolic Panel:  Recent Labs Lab 02/27/17 0313 02/28/17 0352 02/28/17 1656 03/01/17 0331 03/01/17 1553 03/02/17 0424  NA 137 136 136 136 137 135  K  3.2* 3.5 3.5 3.3* 3.3* 3.4*  CL 108 105 102 105 104 103  CO2 21* 21* 23 23 24 24   GLUCOSE 110* 120* 117* 114* 116* 113*  BUN 54* 58* 45* 46* 32* 28*  CREATININE 2.11* 2.46* 1.89* 1.94* 1.60* 1.36*  CALCIUM 8.6* 9.0 8.7* 8.9 8.6* 8.6*  MG 2.1 2.3  --  2.2  --  2.3  PHOS  --   --  3.2 3.5 3.1 2.6   GFR: Estimated Creatinine Clearance: 59.4 mL/min (A) (by C-G formula based on SCr of 1.36 mg/dL (H)). Liver Function Tests:  Recent Labs Lab 02/28/17 1656 03/01/17 1553 03/02/17 0424  ALBUMIN 2.4* 2.2* 2.3*   No results for input(s): LIPASE, AMYLASE in the last 168 hours. No results for input(s): AMMONIA in the last 168 hours. Coagulation Profile: No results for input(s): INR, PROTIME in  the last 168 hours. Cardiac Enzymes: No results for input(s): CKTOTAL, CKMB, CKMBINDEX, TROPONINI in the last 168 hours. BNP (last 3 results) No results for input(s): PROBNP in the last 8760 hours. HbA1C: No results for input(s): HGBA1C in the last 72 hours. CBG:  Recent Labs Lab 03/01/17 1205 03/01/17 1601 03/01/17 1959 03/01/17 2331 03/02/17 0343  GLUCAP 110* 115* 122* 111* 101*   Lipid Profile: No results for input(s): CHOL, HDL, LDLCALC, TRIG, CHOLHDL, LDLDIRECT in the last 72 hours. Thyroid Function Tests: No results for input(s): TSH, T4TOTAL, FREET4, T3FREE, THYROIDAB in the last 72 hours. Anemia Panel: No results for input(s): VITAMINB12, FOLATE, FERRITIN, TIBC, IRON, RETICCTPCT in the last 72 hours. Urine analysis:    Component Value Date/Time   COLORURINE AMBER (A) 02/27/2017 1900   APPEARANCEUR CLOUDY (A) 02/27/2017 1900   LABSPEC 1.020 02/27/2017 1900   PHURINE 5.0 02/27/2017 1900   GLUCOSEU NEGATIVE 02/27/2017 1900   HGBUR SMALL (A) 02/27/2017 1900   BILIRUBINUR NEGATIVE 02/27/2017 1900   KETONESUR NEGATIVE 02/27/2017 1900   PROTEINUR 30 (A) 02/27/2017 1900   NITRITE NEGATIVE 02/27/2017 1900   LEUKOCYTESUR MODERATE (A) 02/27/2017 1900    No results found for this  or any previous visit (from the past 240 hour(s)).  Radiology Studies: Dg Chest Port 1 View Result Date: 02/22/2017 Probable bibasilar atelectasis.   Scheduled Meds: . Chlorhexidine Gluconate Cloth  6 each Topical Daily  . feeding supplement (ENSURE ENLIVE)  237 mL Oral BID BM  . feeding supplement (PRO-STAT SUGAR FREE 64)  30 mL Oral BID  . ferrous sulfate  325 mg Oral Q breakfast  . fluticasone  2 spray Each Nare Daily  . heparin subcutaneous  5,000 Units Subcutaneous Q8H  . insulin aspart  0-15 Units Subcutaneous Q4H  . levothyroxine  100 mcg Oral QAC breakfast  . mouth rinse  15 mL Mouth Rinse BID  . multivitamin with minerals  1 tablet Oral Daily  . pantoprazole  40 mg Oral BID  . senna-docusate  1 tablet Oral BID  . sodium chloride flush  10-40 mL Intracatheter Q12H  . thiamine  100 mg Oral Daily   Continuous Infusions: . sodium chloride    . amiodarone 30 mg/hr (03/02/17 0400)  . cefTRIAXone (ROCEPHIN)  IV Stopped (03/01/17 1030)  . DOPamine 7 mcg/kg/min (03/02/17 0400)  . heparin    . milrinone 0.125 mcg/kg/min (03/02/17 0400)  . dialysis replacement fluid (prismasate) 500 mL/hr at 03/01/17 2345  . dialysis replacement fluid (prismasate) 300 mL/hr at 03/02/17 0315  . dialysate (PRISMASATE) 1,000 mL/hr at 03/02/17 0500     LOS: 14 days   Critical Care Time spent: 32 minutes   Irwin Brakeman, MD [facility] Pager [phone]  If 7PM-7AM, please contact night-coverage [email or web address] Password [email or web address] 03/02/2017, 7:19 AM

## 2017-03-02 NOTE — Progress Notes (Signed)
Advanced Heart Failure Rounding Note  PCP:  Primary Cardiologist: Dr Acie Fredrickson   Subjective:    02/26/17 started on milrinone and placed on lasix drip.   Evening of 02/27/17 pts pressures fell requiring more dopamine, became less responsive and UOP worsening. Pt transferred to [facility].   CVVHD started 7/6 net -125/hr. Weight down another 9 pounds (16 total in 2 days). Had about 800cc urine out.   CVP 118. Co-ox 98% (inaccurate)  on  Milrinone 0.125 and dopamine 6. Remains very fatigued. Poor appetite. Denies dyspnea.    Objective:   Weight Range: 110.9 kg (244 lb 7.8 oz) Body mass index is 40.69 kg/m.   Vital Signs:   Temp:  [97.1 F (36.2 C)-97.5 F (36.4 C)] 97.5 F (36.4 C) (07/08 0800) Pulse Rate:  [93-109] 103 (07/08 1145) Resp:  [9-22] 9 (07/08 1145) BP: (77-106)/(42-74) 98/70 (07/08 1100) SpO2:  [92 %-100 %] 97 % (07/08 1145) Weight:  [110.9 kg (244 lb 7.8 oz)] 110.9 kg (244 lb 7.8 oz) (07/08 0300) Last BM Date: 02/25/17  Weight change: Filed Weights   02/28/17 0145 03/01/17 0400 03/02/17 0300  Weight: 118 kg (260 lb 2.3 oz) 115.2 kg (253 lb 15.5 oz) 110.9 kg (244 lb 7.8 oz)    Intake/Output:   Intake/Output Summary (Last 24 hours) at 03/02/17 1154 Last data filed at 03/02/17 1100  Gross per 24 hour  Intake           952.45 ml  Output             3969 ml  Net         -3016.55 ml     Physical Exam   CVP 18 General: Lying in bed NAD. Fatigued NAD HEENT: Normal anicteric Neck: Supple. JVP to ear  RIJ trialysis cath  No thyromegaly or nodule noted. Cor: PMI nonpalpable.Tachy irregular. No murmur Lungs: Decreased BS anteriorly  Abdomen: obese soft NT. + distended good BS Extremities: No cyanosis or clubbing. Spoon nails. Peeling skin. 4+ edema Neuro: alert & oriented x 3, cranial nerves grossly intact. moves all 4 extremities w/o difficulty. Affect pleasant GU: Foley + light yellow  urine Skin: RLE partial thickness wound 3x1.5 cm 100% pink. Dressing in  place  Buttock with multiple partial thickness wounds.    Telemetry   AFL 100-110  Personally reviewed    EKG   Previously viewed from 02/24/2017 A Flutter 100 bpm   Labs    CBC  Recent Labs  03/01/17 0331 03/02/17 0424  WBC 27.8* 21.4*  NEUTROABS 25.3* 18.8*  HGB 8.3* 8.4*  HCT 27.8* 28.1*  MCV 76.0* 77.6*  PLT 336 222   Basic Metabolic Panel  Recent Labs  03/01/17 0331 03/01/17 1553 03/02/17 0424  NA 136 137 135  K 3.3* 3.3* 3.4*  CL 105 104 103  CO2 23 24 24   GLUCOSE 114* 116* 113*  BUN 46* 32* 28*  CREATININE 1.94* 1.60* 1.36*  CALCIUM 8.9 8.6* 8.6*  MG 2.2  --  2.3  PHOS 3.5 3.1 2.6   Liver Function Tests  Recent Labs  03/01/17 1553 03/02/17 0424  ALBUMIN 2.2* 2.3*   No results for input(s): LIPASE, AMYLASE in the last 72 hours. Cardiac Enzymes No results for input(s): CKTOTAL, CKMB, CKMBINDEX, TROPONINI in the last 72 hours.  BNP: BNP (last 3 results)  Recent Labs  02/16/17 1516  BNP 923.6*    ProBNP (last 3 results) No results for input(s): PROBNP in the last 8760 hours.  D-Dimer No results for input(s): DDIMER in the last 72 hours. Hemoglobin A1C No results for input(s): HGBA1C in the last 72 hours. Fasting Lipid Panel No results for input(s): CHOL, HDL, LDLCALC, TRIG, CHOLHDL, LDLDIRECT in the last 72 hours. Thyroid Function Tests No results for input(s): TSH, T4TOTAL, T3FREE, THYROIDAB in the last 72 hours.  Invalid input(s): FREET3  Other results:   Imaging    No results found.   Medications:     Scheduled Medications: . Chlorhexidine Gluconate Cloth  6 each Topical Daily  . feeding supplement (ENSURE ENLIVE)  237 mL Oral BID BM  . feeding supplement (PRO-STAT SUGAR FREE 64)  30 mL Oral BID  . ferrous sulfate  325 mg Oral Q breakfast  . fluticasone  2 spray Each Nare Daily  . heparin subcutaneous  5,000 Units Subcutaneous Q8H  . insulin aspart  0-15 Units Subcutaneous Q4H  . levothyroxine  100 mcg Oral QAC  breakfast  . multivitamin with minerals  1 tablet Oral Daily  . pantoprazole  40 mg Oral BID  . senna-docusate  1 tablet Oral BID  . sodium chloride flush  10-40 mL Intracatheter Q12H  . thiamine  100 mg Oral Daily    Infusions: . sodium chloride    . amiodarone 30 mg/hr (03/02/17 1100)  . cefTRIAXone (ROCEPHIN)  IV Stopped (03/02/17 1114)  . DOPamine 6 mcg/kg/min (03/02/17 1100)  . heparin 10,000 units/ 20 mL infusion syringe 750 Units/hr (03/02/17 1100)  . heparin    . milrinone 0.125 mcg/kg/min (03/02/17 1100)  . dialysis replacement fluid (prismasate) 500 mL/hr at 03/02/17 1006  . dialysis replacement fluid (prismasate) 300 mL/hr at 03/02/17 0315  . dialysate (PRISMASATE) 1,000 mL/hr at 03/02/17 1005    PRN Medications: sodium chloride, acetaminophen, alum & mag hydroxide-simeth, camphor-menthol, guaiFENesin-dextromethorphan, heparin, heparin, heparin, iopamidol, lip balm, ondansetron (ZOFRAN) IV, promethazine, sodium chloride flush    Patient Profile  56 y/o woman with morbid obesity and little previous medical follow-up. Admitted 6/24 with severe anemia (hgb 3.7) and cardiogenic shock due biventricular HF R>L. Echo reviewed EF 30-35% with severe RV Failure and PAH. Has anasarca and progressive renal failure with inability to mobilize fluid. Transferred to [facility] on 7/4 for further evaluation by CHF team.    Assessment/Plan  1. Acute severe biventricular systolic HF (R>L) - Echo 2/72/53 EF 30% with severe PAH and RV failure with D-shaped septum - Etiology of HF unclear DDx includes: tachy induced vs amyloid vs OHS vs hypothyroidism/myxedema. Low volts on ECG concerning for amyloid. Creatinine too high for CMRI.  - Coox inaccurate this am. Now on milrinone 0.125 mcg/kg/min and dopamine 6. MAPs 70-80s. Continue inotropic support - Remains massively volume overloaded. Continue CVVHD. - Off b-blocker/ACE/ARB due to acute decompensation - Eventually will need VQ and R heart cath and  sleep study - Consider UNNA boots  2. AKI  - Unclear baseline. Creatinine on admit was 1.9.  - CVVHD started 7/6. Responding very well. Remains massively volume overloaded. Continue CVVHD - D/w Dr. Augustin Coupe in Nephrology at bedside - No blood or protein on UA - Will eventually need Renal US. No obstruction seen on CT.  3. Iron deficiency anemia - Hgb on admit was 3.7. With low MCV. Iron stores low. - EGD 02/20/17 with severe esophagitis  - Hgb 8.4 this am. No overt bleeding.  - Received feraheme 02/26/2017.  Continue PPI.  - Will need colonoscopy eventually (as tolerated)  4. Atrial flutter - Rate just mildly elevated in setting of  dopamine. Continue amio - Not on AC due to anemia.  -Will eventually need AC followed by DC-CV and possible ablation - Started SQ heparin on 02/26/2017. Stopped 02/27/17 with Hgb down to 7.3. SCDs R and LLE for DVT prophylaxis. Will rechallenge with SQ heparin in am.    5. Anasarca/ascites in setting of severe protein calorie malnutition - CT abdomen reviewed personally shows marked hepatic steatosis - Likely due to combination of RHF and fatty liver. Albumin 2.7 02/18/17 - prealbumin was < 5 on 02/18/17. - Responding well to CVVHD - has poor appetite. Encouraged po intake. I will place nutrition consult. Does she need TPN??   6. Hypothyroidism - Likely long-standing with spoon nails on exam. - TSH 11. Already started on synthroid - T3 1.3 T4 1.08  - Plasma cortisol 25.8 02/17/17   - Unclear what role this is playing in her cardiomyopathy. No change.   7. Leukocytosis - Improving with initiation of rocephin WBC 27-> 21K -RLE wound does not appear infected. Buttock wounds appear partial thickness with evidence of induration.  PCT only 1.44 02/27/17.  - UA 02/27/17 Cloudy with few bacteria and moderate leukocytes. Started back on Rocephin. No Ucx avaialble - Complete course of antibiotics (ceftriaxone 6/24 - 7/2) transition to keflex to complete 10 days given  persistent leukocytosis.  - No evidence of malignancy on CT. No change.  - Peripheral smear sent. Results pending. If WBC stays up will consult Heme  8. Severe fibroid uterus  9. Morbid obesity - Nutrition following. Body mass index is 40.69 kg/m. Prealbumin < 5.   10. RLE wound Continue foam dressing on RLE wound. No change.   11. Probable OSA/OHS - Bipap as needed. No change.   12-MASD (Moisture Associated Skin Damage) Buttock Wounds- Partial thickness- dose appear infected. Appears to be related to moisture and fecal incontinence.  Will likely need barrier cream as urine leaks under dressing. Prealbumin <5.  - WOC consult pending.  CRITICAL CARE Performed by: Glori Bickers  Total critical care time: 35 minutes  Critical care time was exclusive of separately billable procedures and treating other patients.  Critical care was necessary to treat or prevent imminent or life-threatening deterioration.  Critical care was time spent personally by me (independent of midlevel providers or residents) on the following activities: development of treatment plan with patient and/or surrogate as well as nursing, discussions with consultants, evaluation of patient's response to treatment, examination of patient, obtaining history from patient or surrogate, ordering and performing treatments and interventions, ordering and review of laboratory studies, ordering and review of radiographic studies, pulse oximetry and re-evaluation of patient's condition.     Length of Stay: 14  Glori Bickers, MD  03/02/2017, 11:54 AM  Advanced Heart Failure Team Pager [phone] (M-F; 7a - 4p)  Please contact [facility] for night-coverage after hours (4p -7a ) and weekends on [email or web address]

## 2017-03-02 NOTE — Progress Notes (Signed)
[facility] Progress Note    Assessment/ Plan:   1. Acute Kidney Injury - May be in ATN associated with the heart failure + poor perfusion leading to cardiorenal syndrome, but regardless with the lack of improvement despite appropriate therapy and decreased UOP on Lasix gtt + over 4kg up I agree that we should initiate CVVHD. She does endorse being able to ambulate less than a month ago and I agree we should give her a chance with RRT to see if she recovers. She's certainly not a candidate for iHD if her cardiac function does not improve. - Started  CVVHD on 7/6 --> tolerated uptitration of net UF from 54ml/hr to now 16ml/hr. She continues to be on Amio, Dopamine and Milrinone. Will continue at 134ml/hr (net) as tolerated especially given that the UOP increased (1287ml /24hr) to help maintain renal perfusion. - Appreciate Dr. Nelda Marseille promptly seeing the pt and taking care of the catheter placement. - Seen on CVVHD @740am              500/300/1000 on 4K standard bath w/ net UF of 144ml/hr  Filter clotted on 7/7 --> let's start heparin today  Also a little tachy with somewhat less UOP --> only on 172ml/hr + still a lot of fluid on board. Will continue at same rate for now and monitor closely. Would not be surprised if she develops AKI with the CVVHD but she needs symptomatic relief and we need to get a significant amount of the excessive fluid + Na off. 2. 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 Iron deficiency anemia s/p Feraheme 02/26/2017 + transfusions. 4. Hypothyroidism started on Synthroid 5. Obesity.  Subjective:   Feeling somewhat better with improved breathing, denies CP. Some nausea yesterday has resolved. No f/c.   Objective:   BP 91/67   Pulse (!) 102   Temp (!) 97.1 F (36.2 C) (Oral)   Resp 15   Ht 5\' 5"  (1.651 m)   Wt 110.9 kg (244 lb 7.8 oz)   LMP 01/16/2017 Comment: neg preg 02-16-2017  SpO2 97%   BMI 40.69 kg/m   Intake/Output Summary  (Last 24 hours) at 03/02/17 0741 Last data filed at 03/02/17 0700  Gross per 24 hour  Intake          1077.41 ml  Output             4169 ml  Net         -3091.59 ml   Weight change: -4.3 kg (-9 lb 7.7 oz)  Physical Exam: General appearance: alert, cooperative and appears stated age Resp: rales bibasilar and bilaterally Cardio: tachy GI: soft, non-tender; bowel sounds normal; no masses, no organomegaly Extremities: edema diffuse 2-3+ Pulses: 2+ and symmetric Neurologic: Grossly normal  Imaging: Dg Chest Port 1 View  Result Date: 02/28/2017 CLINICAL DATA:  Central line placement. EXAM: PORTABLE CHEST 1 VIEW COMPARISON:  02/27/2017 FINDINGS: Right internal jugular approach central venous catheter overlies the expected location of proximal superior vena cava. Unchanged appearance of the left internal jugular approach venous catheter which coils in the mediastinum, unchanged. The cardiac silhouette is enlarged. There is no evidence of pneumothorax. Bilateral patchy airspace consolidation in the lower lobes. Low lung volumes. Possible left pleural effusion. Osseous structures are without acute abnormality. Soft tissues are grossly normal. IMPRESSION: Right internal jugular approach central venous catheter overlies expected location of proximal superior vena cava. Enlarged cardiac silhouette. Low lung volumes with bilateral lower lobe airspace opacity  is versus atelectasis. Electronically Signed   By: Fidela Salisbury M.D.   On: 02/28/2017 15:17    Labs: BMET  Recent Labs Lab 02/26/17 0330 02/27/17 0313 02/28/17 0352 02/28/17 1656 03/01/17 0331 03/01/17 1553 03/02/17 0424  NA 138 137 136 136 136 137 135  K 3.6 3.2* 3.5 3.5 3.3* 3.3* 3.4*  CL 107 108 105 102 105 104 103  CO2 20* 21* 21* 23 23 24 24   GLUCOSE 120* 110* 120* 117* 114* 116* 113*  BUN 56* 54* 58* 45* 46* 32* 28*  CREATININE 2.03* 2.11* 2.46* 1.89* 1.94* 1.60* 1.36*  CALCIUM 8.8* 8.6* 9.0 8.7* 8.9 8.6* 8.6*  PHOS   --   --   --  3.2 3.5 3.1 2.6   CBC  Recent Labs Lab 02/27/17 0309 02/28/17 0352 03/01/17 0331 03/02/17 0424  WBC 22.0* 28.5* 27.8* 21.4*  NEUTROABS  --  25.9* 25.3* 18.8*  HGB 7.3* 8.1* 8.3* 8.4*  HCT 24.5* 27.1* 27.8* 28.1*  MCV 75.6* 76.3* 76.0* 77.6*  PLT 271 337 336 298    Medications:    . Chlorhexidine Gluconate Cloth  6 each Topical Daily  . feeding supplement (ENSURE ENLIVE)  237 mL Oral BID BM  . feeding supplement (PRO-STAT SUGAR FREE 64)  30 mL Oral BID  . ferrous sulfate  325 mg Oral Q breakfast  . fluticasone  2 spray Each Nare Daily  . heparin subcutaneous  5,000 Units Subcutaneous Q8H  . insulin aspart  0-15 Units Subcutaneous Q4H  . levothyroxine  100 mcg Oral QAC breakfast  . mouth rinse  15 mL Mouth Rinse BID  . multivitamin with minerals  1 tablet Oral Daily  . pantoprazole  40 mg Oral BID  . senna-docusate  1 tablet Oral BID  . sodium chloride flush  10-40 mL Intracatheter Q12H  . thiamine  100 mg Oral Daily      Otelia Santee, MD 03/02/2017, 7:41 AM

## 2017-03-03 DIAGNOSIS — D72829 Elevated white blood cell count, unspecified: Secondary | ICD-10-CM

## 2017-03-03 DIAGNOSIS — D72823 Leukemoid reaction: Secondary | ICD-10-CM

## 2017-03-03 LAB — BASIC METABOLIC PANEL
Anion gap: 7 (ref 5–15)
BUN: 18 mg/dL (ref 6–20)
CHLORIDE: 105 mmol/L (ref 101–111)
CO2: 26 mmol/L (ref 22–32)
CREATININE: 1.08 mg/dL — AB (ref 0.44–1.00)
Calcium: 8.5 mg/dL — ABNORMAL LOW (ref 8.9–10.3)
GFR calc non Af Amer: 58 mL/min — ABNORMAL LOW (ref >60)
Glucose, Bld: 100 mg/dL — ABNORMAL HIGH (ref 65–99)
POTASSIUM: 3.5 mmol/L (ref 3.5–5.1)
SODIUM: 138 mmol/L (ref 135–145)

## 2017-03-03 LAB — POCT ACTIVATED CLOTTING TIME
ACTIVATED CLOTTING TIME: 186 s
ACTIVATED CLOTTING TIME: 197 s
ACTIVATED CLOTTING TIME: 197 s
ACTIVATED CLOTTING TIME: 213 s
ACTIVATED CLOTTING TIME: 213 s
ACTIVATED CLOTTING TIME: 246 s
Activated Clotting Time: 186 seconds
Activated Clotting Time: 202 seconds
Activated Clotting Time: 213 seconds
Activated Clotting Time: 252 seconds

## 2017-03-03 LAB — IMMUNOFIXATION ELECTROPHORESIS
IGA: 189 mg/dL (ref 87–352)
IGG (IMMUNOGLOBIN G), SERUM: 818 mg/dL (ref 700–1600)
IgM, Serum: 86 mg/dL (ref 26–217)
Total Protein ELP: 5.2 g/dL — ABNORMAL LOW (ref 6.0–8.5)

## 2017-03-03 LAB — COOXEMETRY PANEL
Carboxyhemoglobin: 1.4 % (ref 0.5–1.5)
METHEMOGLOBIN: 1.2 % (ref 0.0–1.5)
O2 SAT: 73.9 %
TOTAL HEMOGLOBIN: 8.2 g/dL — AB (ref 12.0–16.0)

## 2017-03-03 LAB — RENAL FUNCTION PANEL
ALBUMIN: 2.3 g/dL — AB (ref 3.5–5.0)
ANION GAP: 7 (ref 5–15)
ANION GAP: 8 (ref 5–15)
Albumin: 2.3 g/dL — ABNORMAL LOW (ref 3.5–5.0)
BUN: 18 mg/dL (ref 6–20)
BUN: 14 mg/dL (ref 6–20)
CALCIUM: 8.4 mg/dL — AB (ref 8.9–10.3)
CALCIUM: 8.4 mg/dL — AB (ref 8.9–10.3)
CHLORIDE: 104 mmol/L (ref 101–111)
CO2: 26 mmol/L (ref 22–32)
CO2: 26 mmol/L (ref 22–32)
Chloride: 104 mmol/L (ref 101–111)
Creatinine, Ser: 1.08 mg/dL — ABNORMAL HIGH (ref 0.44–1.00)
Creatinine, Ser: 0.97 mg/dL (ref 0.44–1.00)
GFR calc Af Amer: >60 mL/min (ref >60)
GFR calc non Af Amer: 58 mL/min — ABNORMAL LOW (ref >60)
GLUCOSE: 99 mg/dL (ref 65–99)
Glucose, Bld: 99 mg/dL (ref 65–99)
PHOSPHORUS: 1.3 mg/dL — AB (ref 2.5–4.6)
POTASSIUM: 3.5 mmol/L (ref 3.5–5.1)
POTASSIUM: 3.6 mmol/L (ref 3.5–5.1)
Phosphorus: 1.9 mg/dL — ABNORMAL LOW (ref 2.5–4.6)
SODIUM: 137 mmol/L (ref 135–145)
SODIUM: 138 mmol/L (ref 135–145)

## 2017-03-03 LAB — APTT: aPTT: >200 seconds (ref 24–36)

## 2017-03-03 LAB — CBC
HCT: 27.6 % — ABNORMAL LOW (ref 36.0–46.0)
Hemoglobin: 8.1 g/dL — ABNORMAL LOW (ref 12.0–15.0)
MCH: 22.8 pg — ABNORMAL LOW (ref 26.0–34.0)
MCHC: 29.3 g/dL — ABNORMAL LOW (ref 30.0–36.0)
MCV: 77.7 fL — AB (ref 78.0–100.0)
PLATELETS: 308 10*3/uL (ref 150–400)
RBC: 3.55 MIL/uL — ABNORMAL LOW (ref 3.87–5.11)
RDW: 31.1 % — AB (ref 11.5–15.5)
WBC: 21.5 10*3/uL — AB (ref 4.0–10.5)

## 2017-03-03 LAB — GLUCOSE, CAPILLARY
GLUCOSE-CAPILLARY: 102 mg/dL — AB (ref 65–99)
GLUCOSE-CAPILLARY: 102 mg/dL — AB (ref 65–99)
GLUCOSE-CAPILLARY: 89 mg/dL (ref 65–99)
Glucose-Capillary: 122 mg/dL — ABNORMAL HIGH (ref 65–99)
Glucose-Capillary: 93 mg/dL (ref 65–99)
Glucose-Capillary: 83 mg/dL (ref 65–99)
Glucose-Capillary: 96 mg/dL (ref 65–99)

## 2017-03-03 LAB — DIFFERENTIAL
Basophils Absolute: 0 10*3/uL (ref 0.0–0.1)
Basophils Relative: 0 %
Eosinophils Absolute: 0.4 10*3/uL (ref 0.0–0.7)
Eosinophils Relative: 2 %
LYMPHS ABS: 1.3 10*3/uL (ref 0.7–4.0)
Lymphocytes Relative: 6 %
MONO ABS: 1.1 10*3/uL — AB (ref 0.1–1.0)
Monocytes Relative: 5 %
NEUTROS ABS: 18.7 10*3/uL — AB (ref 1.7–7.7)
Neutrophils Relative %: 87 %

## 2017-03-03 LAB — MAGNESIUM: Magnesium: 2.3 mg/dL (ref 1.7–2.4)

## 2017-03-03 LAB — PROCALCITONIN: Procalcitonin: 0.4 ng/mL

## 2017-03-03 MED ORDER — HYDROXYZINE HCL 25 MG PO TABS
25.0000 mg | ORAL_TABLET | Freq: Four times a day (QID) | ORAL | Status: DC | PRN
  Administered 2017-03-04 – 2017-03-06 (×3): 25 mg via ORAL
  Filled 2017-03-03 (×3): qty 1

## 2017-03-03 NOTE — Progress Notes (Signed)
Patient ID: Samantha Richards, female   DOB: Jan 02, 1961, 56 y.o.   MRN: [id number]  PROGRESS NOTE  Samantha Richards  ACZ:[id number] DOB: 1961-02-23 DOA: 02/16/2017  PCP: Patient, No Pcp Per  Brief Narrative:  56 y.o. female who presented on 6/24 after a fall at home found to be in hemorrhagic/hypovolemic and cardiogenic shock, severely anemic in the setting of slow GI bleed and Hgb 3.7. Transfusions and BiPAP provided in addition to vancomycin/ceftriaxone for cellulitis of the back. EGD 6/28 showed gastritis and esophagitis, and pt declined colonoscopy. Levophed was weaned 6/28 and the patient was transferred to hospitalist service 6/29. AFib with RVR has been managed by cardiology, and has converted to sinus rhythm. EF 30% with evidence of RV overload/cor pulmonale. She appears severely malnourished with anasarca with ascites, paracentesis 6/26 of 2.5L fluid with negative cytology and culture. Lasix infusion was started 7/2.  Per cardiology assessment, pt may benefit from milrinone drip and transferred to [facility] to be seen by Heart Failure Team.   Assessment & Plan:   Cardiogenic shock with acute systolic and diastolic CHF and RV overload and cor pulmonale - Off pressor support since 6/28 - Cortisol 25.8 - ECHO showed RV overload, diffusely hypokinetic and LVEF 30-35% - Pt has diffuse anasarca that is slowly improving with Hemodialysis fluid removal - Started on milrinone drip per HF team - Now on milrinone, dopamine and amiodarone drip - Pt tolerating hemodialysis - Pt remains full code  Acute blood loss anemia / Acute upper GI bleed / hematemesis / Iron deficiency anemia - Symptomatic anemia with hgb of 3.7 on admission - Occult negative on admission - EGD 6/28 showed gastritis / esophagitis - GI signed off 6/29, recommended to call back when pt more stable and willing to undergo colonoscopy  - Continue Protonix 40 mg BID - Continue ferrous sulfate supplementation   Ascites - SAAG > 1.1 -  Negative SBP - S/P paracentesis  - Given increasing leukocytosis, started ceftriaxone  Hypokalemia - repleted    CKD stage 3 - nephrology started patient on CVVHD 7/6  Generalized weakness - In the setting of acute decompensated CHF - PT recommended SNF placement  Atrial fibrillation with RVR - CHADS vasc score at least 3 - On amiodarone drip, metoprolol 12.5 mg PO BID - Cardiology team following - now on subcut heparin  Cellulitis, right lower extremity / Leukocytosis  - given worsening leukocytosis, DC cephalexin, restart ceftriaxone 7/6 - Vanco and rocephin stopped but planning to restart ceftriaxone 7/6.  - urinalysis suggestive of UTI and was started on ceftriaxone 7/6  Leukocytosis - WBC trending down, concerned about SBP given abdominal ascites, continue IV ceftriaxone to cover for SBP and UTI.  Follow CBC with diff, follow cultures.  For any temp greater than 100.4, recommend repeating blood cultures.   Hypothyroidism - TSH 11.7 - Started levothyroxine, recommending to recheck TSH in 6-8 weeks  Severe protein calorie malnutrition  - Counseled on diet - Seen by nutritionist   Stage 2 pressure skin injury to bilateral buttocks and right lower extremity  - Seen by wound care, appreciate their assessment - Bilateral buttocks 18 cm x 30 cm with partial thickness tissue loss - Right lateral and posterior foot with 15 cm x 15 cm area of maceration with serous exudate - Added bariatric mattress, moisture barrier ointment   DVT prophylaxis: heparin Code Status: full code  Family Communication: no family at the bedside during rounds Disposition Plan: TBD    Consultants:   Cardiology,  HF team  GI, [facility]   Pulmonary / CCM   Nutrition  PT - SNF recommended   WOC  Nephrology  Procedures:   Paracentesis 6/26 - 2.5 L fluid removed   B/ LE venous doppler 6/25 - no DVT  EGD 6/28 - distal esophagitis and gastritis   Antimicrobials:   Vanco 6/24 -->  6/28  Rocephin 6/24 --> 7/2, 7/4-->  Cephalexin 7/2 -->7/4  Subjective: Pt says that the swelling in the abdomen feels like it is much less   Objective: Vitals:   03/03/17 0400 03/03/17 0500 03/03/17 0600 03/03/17 0700  BP: 100/67 91/69 95/66  96/64  Pulse: (!) 104 (!) 103 (!) 103 (!) 106  Resp: 14 13 16 16   Temp: (!) 96.9 F (36.1 C)     TempSrc: Oral     SpO2: 99% 100% 96% 97%  Weight:  110.7 kg (244 lb 0.8 oz)    Height:        Intake/Output Summary (Last 24 hours) at 03/03/17 0722 Last data filed at 03/03/17 0700  Gross per 24 hour  Intake           818.35 ml  Output             3591 ml  Net         -2772.65 ml   Filed Weights   03/01/17 0400 03/02/17 0300 03/03/17 0500  Weight: 115.2 kg (253 lb 15.5 oz) 110.9 kg (244 lb 7.8 oz) 110.7 kg (244 lb 0.8 oz)    Examination:  General exam: Appears calm and comfortable, NAD.  Respiratory system: no increased work of breathing.   Cardiovascular system: irregular, S1 & S2 heard, Rate controlled, (+) JVD Gastrointestinal system: Abdomen is distended, soft and nontender. No organomegaly or masses felt. Normal bowel sounds heard. Central nervous system: Alert and oriented. No focal neurological deficits. Extremities: 1+ pitting edema B/L LE Skin: slightly improved cracked scaling skin BLEs with chronic venous stasis changes.  Psychiatry: flat affect.  Data Reviewed: I have personally reviewed following labs and imaging studies  CBC:  Recent Labs Lab 02/27/17 0309 02/28/17 0352 03/01/17 0331 03/02/17 0424 03/03/17 0407  WBC 22.0* 28.5* 27.8* 21.4* 21.5*  NEUTROABS  --  25.9* 25.3* 18.8* 18.7*  HGB 7.3* 8.1* 8.3* 8.4* 8.1*  HCT 24.5* 27.1* 27.8* 28.1* 27.6*  MCV 75.6* 76.3* 76.0* 77.6* 77.7*  PLT 271 337 336 298 578   Basic Metabolic Panel:  Recent Labs Lab 02/27/17 0313 02/28/17 0352  03/01/17 0331 03/01/17 1553 03/02/17 0424 03/02/17 1600 03/03/17 0407  NA 137 136  < > 136 137 135 136 137  138  K  3.2* 3.5  < > 3.3* 3.3* 3.4* 3.3* 3.5  3.5  CL 108 105  < > 105 104 103 105 104  105  CO2 21* 21*  < > 23 24 24 26 26  26   GLUCOSE 110* 120*  < > 114* 116* 113* 113* 99  100*  BUN 54* 58*  < > 46* 32* 28* 23* 18  18  CREATININE 2.11* 2.46*  < > 1.94* 1.60* 1.36* 1.17* 1.08*  1.08*  CALCIUM 8.6* 9.0  < > 8.9 8.6* 8.6* 8.4* 8.4*  8.5*  MG 2.1 2.3  --  2.2  --  2.3  --  2.3  PHOS  --   --   < > 3.5 3.1 2.6 2.1* 1.9*  < > = values in this interval not displayed. GFR: Estimated Creatinine Clearance: 74.7 mL/min (A) (by  C-G formula based on SCr of 1.08 mg/dL (H)). Liver Function Tests:  Recent Labs Lab 02/28/17 1656 03/01/17 1553 03/02/17 0424 03/02/17 1600 03/03/17 0407  ALBUMIN 2.4* 2.2* 2.3* 2.2* 2.3*   No results for input(s): LIPASE, AMYLASE in the last 168 hours. No results for input(s): AMMONIA in the last 168 hours. Coagulation Profile: No results for input(s): INR, PROTIME in the last 168 hours. Cardiac Enzymes: No results for input(s): CKTOTAL, CKMB, CKMBINDEX, TROPONINI in the last 168 hours. BNP (last 3 results) No results for input(s): PROBNP in the last 8760 hours. HbA1C: No results for input(s): HGBA1C in the last 72 hours. CBG:  Recent Labs Lab 03/02/17 1055 03/02/17 1549 03/02/17 2005 03/02/17 2359 03/03/17 0411  GLUCAP 110* 102* 106* 102* 93   Lipid Profile: No results for input(s): CHOL, HDL, LDLCALC, TRIG, CHOLHDL, LDLDIRECT in the last 72 hours. Thyroid Function Tests: No results for input(s): TSH, T4TOTAL, FREET4, T3FREE, THYROIDAB in the last 72 hours. Anemia Panel: No results for input(s): VITAMINB12, FOLATE, FERRITIN, TIBC, IRON, RETICCTPCT in the last 72 hours. Urine analysis:    Component Value Date/Time   COLORURINE AMBER (A) 02/27/2017 1900   APPEARANCEUR CLOUDY (A) 02/27/2017 1900   LABSPEC 1.020 02/27/2017 1900   PHURINE 5.0 02/27/2017 1900   GLUCOSEU NEGATIVE 02/27/2017 1900   HGBUR SMALL (A) 02/27/2017 1900   BILIRUBINUR  NEGATIVE 02/27/2017 1900   KETONESUR NEGATIVE 02/27/2017 1900   PROTEINUR 30 (A) 02/27/2017 1900   NITRITE NEGATIVE 02/27/2017 1900   LEUKOCYTESUR MODERATE (A) 02/27/2017 1900    No results found for this or any previous visit (from the past 240 hour(s)).  Radiology Studies: Dg Chest Port 1 View Result Date: 02/22/2017 Probable bibasilar atelectasis.   Scheduled Meds: . Chlorhexidine Gluconate Cloth  6 each Topical Daily  . feeding supplement (ENSURE ENLIVE)  237 mL Oral BID BM  . feeding supplement (PRO-STAT SUGAR FREE 64)  30 mL Oral BID  . ferrous sulfate  325 mg Oral Q breakfast  . fluticasone  2 spray Each Nare Daily  . heparin subcutaneous  5,000 Units Subcutaneous Q8H  . insulin aspart  0-15 Units Subcutaneous Q4H  . levothyroxine  100 mcg Oral QAC breakfast  . multivitamin with minerals  1 tablet Oral Daily  . pantoprazole  40 mg Oral BID  . senna-docusate  1 tablet Oral BID  . sodium chloride flush  10-40 mL Intracatheter Q12H  . thiamine  100 mg Oral Daily   Continuous Infusions: . sodium chloride    . amiodarone 30 mg/hr (03/03/17 0400)  . cefTRIAXone (ROCEPHIN)  IV Stopped (03/02/17 1114)  . DOPamine 4 mcg/kg/min (03/03/17 0400)  . heparin 10,000 units/ 20 mL infusion syringe 1,950 Units/hr (03/03/17 0700)  . heparin    . milrinone 0.125 mcg/kg/min (03/03/17 0400)  . dialysis replacement fluid (prismasate) 500 mL/hr at 03/03/17 0625  . dialysis replacement fluid (prismasate) 300 mL/hr at 03/02/17 2000  . dialysate (PRISMASATE) 1,000 mL/hr at 03/03/17 0620     LOS: 15 days   Critical Care Time spent: 31 minutes   Irwin Brakeman, MD [facility] Pager [phone]  If 7PM-7AM, please contact night-coverage [email or web address] Password [email or web address] 03/03/2017, 7:22 AM

## 2017-03-03 NOTE — Progress Notes (Signed)
CSW received call from financial counseling- they plan to make appointment for Wednesday to complete Medicaid/disability application for patient  CSW will continue to follow and assess for disposition needs when patient is more medically stable  Jorge Ny, [place] Social Worker [phone]

## 2017-03-03 NOTE — Consult Note (Signed)
Referring MD: Dr. Norwood Levo  PCP:  Patient, No Pcp Per   Reason for Referral:  Hematology consultation: Unexplained persistent leukocytosis  Chief Complaint  Patient presents with  . Weakness    HPI: 56 year old woman who does not have regular medical care and has not seen a doctor in years.  She had no known prior medical or surgical illness.  She was on no chronic medications.  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  She fell down about 1 month ago.  Overall status has declined since then up until 3 or 4 days prior to admission when she was totally bedridden and not eating or drinking.  On presentation to the hospital on June 24, she was afebrile but hypotensive with blood pressure 80/30.  Tachycardic with pulse 141.  Mild tachypnea.  Bibasilar rales.  Rapid cardiac rhythm without any murmur gallop or rub.  Alert and oriented.  No focal neurologic deficits.  Multiple areas of skin breakdown along the buttocks thighs and legs.  2-3+ peripheral edema extending to the abdominal wall.  Multiple laboratory abnormalities with severe microcytic anemia hemoglobin 3.7, hematocrit 14, MCV 61, white count 19,000 with 87% neutrophils, 8 lymphocytes, 5 monocytes and platelet count 288,000.  She was dehydrated with BUN 52, creatinine 2.  Malnourished with albumin 2.8.  Bilirubin mildly elevated at 2.9 with normal transaminases.  Lactic acid 2.46, repeat 1.4.  Troponins undetectable.  Initial urine unremarkable and no proteinuria.  Echocardiogram to evaluate cardiomegaly and hypotension shows a significant biventricular dilated cardiomyopathy with estimated systolic ejection fraction 30-35% with diffuse hypokinesis, a dilated right ventricle and tricuspid regurgitation.  Upper endoscopy showed severe distal esophagitis without active hemorrhage or mass.  Random biopsies negative for malignancy or Helicobacter.  Colonoscopy planned but temporarily deferred.  CT scan of the abdomen and pelvis shows  hepatic steatosis, ascites, and a uterus with multiple fibroids.  Patient states that her periods have been regular and not excessively heavy although I believe chronic menstrual losses likely are contributing to her iron deficiency anemia.  She has seen scant amounts of blood on the toilet tissue which she attributes to her known hemorrhoids. She was initially on Levophed, phenylephrine, and dobutamine.  She is currently on milrinone and dopamine.  She developed transient atrial flutter and was on amiodarone for rate control. She has been afebrile.  Local care given for her decubitus another skin ulcers.  She was started on hemodialysis beginning July 6 in view of progressive anasarca and renal failure with inability to mobilize fluid with diuretics and inotropes.  She has had a persistent leukocytosis in the range of 20-28000 with marked shift of the left 85- 91% neutrophils since admission.  She is not on steroids.  She has been on multiple vasoactive drugs including norepinephrine, phenylephrine, dobutamine, and currently dopamine. She has not been febrile.  Serum protein electrophoresis shows no monoclonal protein to suggest AL amyloidosis.  Transthyretin amyloidosis is not excluded.  Neither one of these disorders would be associated with an elevated white count.  Past medical history: She denies previous MI, emphysema, asthma, thyroid disease, diabetes, ulcers hepatitis, yellow jaundice, prior kidney disease, inflammatory arthritis condition. She is a never smoker, no alcohol, no recreational drugs.  Past Surgical History:  Procedure Laterality Date  . ESOPHAGOGASTRODUODENOSCOPY (EGD) WITH PROPOFOL N/A 02/20/2017   Procedure: ESOPHAGOGASTRODUODENOSCOPY (EGD) WITH PROPOFOL;  Surgeon: Teena Irani, MD;  Location: [facility] ENDOSCOPY;  Service: Endoscopy;  Laterality: N/A;  :  . Chlorhexidine Gluconate Cloth  6 each Topical Daily  . feeding supplement (ENSURE ENLIVE)  237 mL Oral BID BM  . feeding  supplement (PRO-STAT SUGAR FREE 64)  30 mL Oral BID  . ferrous sulfate  325 mg Oral Q breakfast  . fluticasone  2 spray Each Nare Daily  . heparin subcutaneous  5,000 Units Subcutaneous Q8H  . insulin aspart  0-15 Units Subcutaneous Q4H  . levothyroxine  100 mcg Oral QAC breakfast  . multivitamin with minerals  1 tablet Oral Daily  . pantoprazole  40 mg Oral BID  . senna-docusate  1 tablet Oral BID  . sodium chloride flush  10-40 mL Intracatheter Q12H  . thiamine  100 mg Oral Daily  :  Allergies  Allergen Reactions  . Gentamicin Itching and Rash  :  Family History  Problem Relation Age of Onset  . Diabetes Mother   . Hypertension Mother   . Heart disease Father   . Diabetes Sister   :  Social History   Social History  . Marital status: Single    Spouse name: N/A  . Number of children:  No children  . Years of education: N/A   Occupational History  .  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 Smoking status: Never Smoker  . Smokeless tobacco: Never Used  . Alcohol use No  . Drug use: No  . Sexual activity: Not on file   Other Topics Concern  . Not on file   Social History Narrative  . No narrative on file  :  ROS: Eyes: No change in vision. Throat:  Neck: Resp: See HPI Cardio: See HPI GI: See HPI Extremities: See HPI Lymph nodes:  Neurologic: No acute or chronic headaches Skin: .  No unusual rashes Genitourinary: See HPI  Vitals: Vitals:   03/03/17 1700 03/03/17 1800  BP: (!) 86/59 (!) 87/53  Pulse: (!) 114 (!) 114  Resp: 18 (!) 21  Temp:      PHYSICAL EXAM: General appearance: Pleasant African-American woman in no distress HEENT: Pharynx no erythema or exudate.  Bilateral internal jugular access lines. Lymph Nodes: No cervical, supraclavicular, or axillary adenopathy Resp: Lungs grossly clear to auscultation.  Complete exam could not be done.  Patient in the middle of a dialysis session Cardio: Rapid regular  cardiac rhythm with prominent S4 gallop.  Musical high-pitched 2/6 apical systolic murmur Vascular: Right internal jugular vascular access catheter and dialysis in progress.  Left triple lumen internal jugular central line. Breasts: No dominant mass in either breast GI: Abdomen is soft, firm, absent bowel sounds, nontender, no gross mass or organomegaly by percussion.  Abdominal wall edema. GU: Foley catheter with clear yellow urine Extremities: 3+ brawny edema Rectal exam on admission reported as internal hemorrhoids.  Occult blood negative. Neurologic: Alert and oriented 3.  Motor strength 5/5.  Cranial nerves grossly normal. Skin: Approximate 3 x 3 cm pressure ulcer proximal right medial thigh grade 3, surrounding exudate. A more shallow superficial grade 1 linear ulcer right calf.  Labs:   Recent Labs  03/02/17 0424 03/03/17 0407  WBC 21.4* 21.5*  HGB 8.4* 8.1*  HCT 28.1* 27.6*  PLT 298 308    Recent Labs  03/03/17 0407 03/03/17 1600  NA 137  138 138  K 3.5  3.5 3.6  CL 104  105 104  CO2 26  26 26   GLUCOSE 99  100* 99  BUN 18  18 14   CREATININE 1.08*  1.08* 0.97  CALCIUM 8.4*  8.5* 8.4*    Blood smear review: Hypochromic microcytic red cells, 2+ is a poikilocytosis, 1+ polychromasia, no schistocytes, spherocytes, or red cell inclusions.  Marked shift to the left.  Benign appearing neutrophilia with a predominance of mature neutrophils which do not appear toxic, no toxic granulations or Dohle bodies, normal lobation.  Rare metamyelocyte.  No blasts.  Platelets appear normal in number and morphology.  Images Studies/Results:  No results found.   Impression: Benign appearing neutrophilia with mild thrombocytosis.  This pattern is nonspecific.  It appears to be a stress reaction.  White count elevation  seems disproportionate to the presence of decubitus skin ulcers but they are likely contributing.  Other potential contributions could be the current use of  dopamine and other recently used vasoactive drugs.  Catecholamines, particularly epinephrine, can cause nonspecific demargination of neutrophils as a physiologic stress reaction.  She is not on any steroids.  She is not grossly septic and there are no toxic changes in the neutrophils. This does not appear to be a leukemic process. Occult malignancy is in the differential and can cause nonspecific leukocytosis and thrombocytosis.  No gross evidence for this so far.  She does have a fibroid uterus.  Although there is a potential to transform into a more aggressive sarcoma, this is extremely rare.  She denies any dysfunctional uterine bleeding.  Recommendation: I have no specific recommendations at this time other than continued observation.  Colonoscopy is planned when she is more stable.  When she otherwise recovers from the acute illness, she should also have a formal gynecologic evaluation.  Murriel Hopper, MD, [place]  Hematology-Oncology/Internal Medicine  03/03/2017, 6:14 PM

## 2017-03-03 NOTE — Progress Notes (Signed)
Patient ID: Samantha Richards, female   DOB: 27-Oct-1960, 56 y.o.   MRN: [id number] S:No complaints and tolerating CVVHD with almost 3 liters UF over last 24 hours O:BP 97/63   Pulse (!) 107   Temp 97.7 F (36.5 C) (Oral)   Resp 17   Ht 5\' 5"  (1.651 m)   Wt 110.7 kg (244 lb 0.8 oz)   LMP 01/16/2017 Comment: neg preg 02-16-2017  SpO2 95%   BMI 40.61 kg/m   Intake/Output Summary (Last 24 hours) at 03/03/17 1010 Last data filed at 03/03/17 1000  Gross per 24 hour  Intake           917.13 ml  Output             3640 ml  Net         -2722.87 ml   Intake/Output: I/O last 3 completed shifts: In: 1378.8 [P.O.:180; I.V.:1198.8] Out: 8185 [Urine:690; UDJSH:7026]  Intake/Output this shift:  Total I/O In: 170 [P.O.:30; I.V.:90; IV Piggyback:50] Out: 502 [Urine:56; Other:446] Weight change: -0.2 kg (-7.1 oz) VZC:HYIFO, NAD YDX:AJOIN no rub Resp:decreased bs OMV:EHMCN, + anasarca and ascites Ext:3+ pitting edema to abdomen   Recent Labs Lab 02/28/17 0352 02/28/17 1656 03/01/17 0331 03/01/17 1553 03/02/17 0424 03/02/17 1600 03/03/17 0407  NA 136 136 136 137 135 136 137  138  K 3.5 3.5 3.3* 3.3* 3.4* 3.3* 3.5  3.5  CL 105 102 105 104 103 105 104  105  CO2 21* 23 23 24 24 26 26  26   GLUCOSE 120* 117* 114* 116* 113* 113* 99  100*  BUN 58* 45* 46* 32* 28* 23* 18  18  CREATININE 2.46* 1.89* 1.94* 1.60* 1.36* 1.17* 1.08*  1.08*  ALBUMIN  --  2.4*  --  2.2* 2.3* 2.2* 2.3*  CALCIUM 9.0 8.7* 8.9 8.6* 8.6* 8.4* 8.4*  8.5*  PHOS  --  3.2 3.5 3.1 2.6 2.1* 1.9*   Liver Function Tests:  Recent Labs Lab 03/02/17 0424 03/02/17 1600 03/03/17 0407  ALBUMIN 2.3* 2.2* 2.3*   No results for input(s): LIPASE, AMYLASE in the last 168 hours. No results for input(s): AMMONIA in the last 168 hours. CBC:  Recent Labs Lab 02/27/17 0309  02/28/17 0352 03/01/17 0331 03/02/17 0424 03/03/17 0407  WBC 22.0*  --  28.5* 27.8* 21.4* 21.5*  NEUTROABS  --   < > 25.9* 25.3* 18.8* 18.7*  HGB  7.3*  --  8.1* 8.3* 8.4* 8.1*  HCT 24.5*  --  27.1* 27.8* 28.1* 27.6*  MCV 75.6*  --  76.3* 76.0* 77.6* 77.7*  PLT 271  --  337 336 298 308  < > = values in this interval not displayed. Cardiac Enzymes: No results for input(s): CKTOTAL, CKMB, CKMBINDEX, TROPONINI in the last 168 hours. CBG:  Recent Labs Lab 03/02/17 1549 03/02/17 2005 03/02/17 2359 03/03/17 0411 03/03/17 0740  GLUCAP 102* 106* 102* 93 89    Iron Studies: No results for input(s): IRON, TIBC, TRANSFERRIN, FERRITIN in the last 72 hours. Studies/Results: No results found. . Chlorhexidine Gluconate Cloth  6 each Topical Daily  . feeding supplement (ENSURE ENLIVE)  237 mL Oral BID BM  . feeding supplement (PRO-STAT SUGAR FREE 64)  30 mL Oral BID  . ferrous sulfate  325 mg Oral Q breakfast  . fluticasone  2 spray Each Nare Daily  . heparin subcutaneous  5,000 Units Subcutaneous Q8H  . insulin aspart  0-15 Units Subcutaneous Q4H  . levothyroxine  100 mcg Oral QAC  breakfast  . multivitamin with minerals  1 tablet Oral Daily  . pantoprazole  40 mg Oral BID  . senna-docusate  1 tablet Oral BID  . sodium chloride flush  10-40 mL Intracatheter Q12H  . thiamine  100 mg Oral Daily    BMET    Component Value Date/Time   NA 138 03/03/2017 0407   NA 137 03/03/2017 0407   K 3.5 03/03/2017 0407   K 3.5 03/03/2017 0407   CL 105 03/03/2017 0407   CL 104 03/03/2017 0407   CO2 26 03/03/2017 0407   CO2 26 03/03/2017 0407   GLUCOSE 100 (H) 03/03/2017 0407   GLUCOSE 99 03/03/2017 0407   BUN 18 03/03/2017 0407   BUN 18 03/03/2017 0407   CREATININE 1.08 (H) 03/03/2017 0407   CREATININE 1.08 (H) 03/03/2017 0407   CALCIUM 8.5 (L) 03/03/2017 0407   CALCIUM 8.4 (L) 03/03/2017 0407   GFRNONAA 58 (L) 03/03/2017 0407   GFRNONAA 58 (L) 03/03/2017 0407   GFRAA >60 03/03/2017 0407   GFRAA >60 03/03/2017 0407   CBC    Component Value Date/Time   WBC 21.5 (H) 03/03/2017 0407   RBC 3.55 (L) 03/03/2017 0407   HGB 8.1 (L)  03/03/2017 0407   HCT 27.6 (L) 03/03/2017 0407   PLT 308 03/03/2017 0407   MCV 77.7 (L) 03/03/2017 0407   MCH 22.8 (L) 03/03/2017 0407   MCHC 29.3 (L) 03/03/2017 0407   RDW 31.1 (H) 03/03/2017 0407   LYMPHSABS 1.3 03/03/2017 0407   MONOABS 1.1 (H) 03/03/2017 0407   EOSABS 0.4 03/03/2017 0407   BASOSABS 0.0 03/03/2017 0407     Assessment/Plan:  1. AKI- oliguric in setting of hemorrhagic/hypovolemic/cardiogenic shock.  Unknown baseline renal function.  Developed massive anasarca with resuscitation attempts and initiated on CVVHD 02/28/17 due to decreasing UOP on multiple pressors and lasix gtt.  Tolerating UF with CVVHDF and BP/pressors stable for now.  Continue with UF as tolerated.   2. CVVHD prescription: 4K/2.5Ca for all fluids, 500/300/1000 with net UF of 173ml/hr 3. Cardiogenic shock/Biventricular heart failure- ECHO 02/17/17 EF 30% 1. On milrinone, dopamine, and amiodarone drip per HF team 2. UF with CVVHD as tolerated 4. A fib with RVR 5. ABLA/Iron deficiency anemia- in setting of upper GI bleed.  EGD c/w gastritis/esophagitis.  On protonix per GI 6. Obesity 7. Anasarca with ascites- UF with cvvhd 8. Cellulitis- right lower ext- on ceftriaxone 9. Possible UTI as above 10. Hypothyroidism - started on levothyroxine 11. Severe protein malnutrition 12. Stage 2 pressure ulcers- will check ck levels as may have had some rhabdo as well given she was found down   Donetta Potts, MD [employer] [phone]

## 2017-03-03 NOTE — Progress Notes (Signed)
Advanced Heart Failure Rounding Note  PCP:  Primary Cardiologist: Dr Acie Fredrickson   Subjective:    02/26/17 started on milrinone and placed on lasix drip.   Evening of 02/27/17 pts pressures fell requiring more dopamine, became less responsive and UOP worsening. Pt transferred to [place].   CVVHD started 7/6 net - 120 ml/hr (have gotten 140 off the past two hours).  Weight unchanged overnight. CVP 14 . Co ox 74%. 49 ml urine out overnight.   Remains on milrinone 0.125 mcg + dopamine 1mcg. Says she feels better today, complaining of itching all over. Mouth is dry.    Objective:   Weight Range: 244 lb 0.8 oz (110.7 kg) Body mass index is 40.61 kg/m.   Vital Signs:   Temp:  [96.9 F (36.1 C)-97.7 F (36.5 C)] 97.7 F (36.5 C) (07/09 0759) Pulse Rate:  [103-109] 106 (07/09 0700) Resp:  [9-21] 16 (07/09 0800) BP: (88-100)/(58-71) 89/64 (07/09 0800) SpO2:  [96 %-100 %] 98 % (07/09 0800) Weight:  [244 lb 0.8 oz (110.7 kg)] 244 lb 0.8 oz (110.7 kg) (07/09 0500) Last BM Date: 02/25/17  Weight change: Filed Weights   03/01/17 0400 03/02/17 0300 03/03/17 0500  Weight: 253 lb 15.5 oz (115.2 kg) 244 lb 7.8 oz (110.9 kg) 244 lb 0.8 oz (110.7 kg)    Intake/Output:   Intake/Output Summary (Last 24 hours) at 03/03/17 0912 Last data filed at 03/03/17 0800  Gross per 24 hour  Intake           777.13 ml  Output             3464 ml  Net         -2686.87 ml     Physical Exam   CVP 14 General:Ill appearing female, NAD. Lying in bed.  HEENT:Normal.  Neck: Supple, JVP to ear.  RIJ trialysis cath  No thyromegaly or nodule noted. Cor: PMI nonpalpable.Tachy, regular. No M/R/G's.  Lungs: Clear in upper lobes, diminished in bilateral bases.  Abdomen: Obese, soft, nontender. + bowel sounds.  Extremities: No cyanosis or clubbing.Spoon nails.  4+ edema to thighs.  Neuro: Alert and oriented x 3.  cranial nerves grossly intact. moves all 4 extremities w/o difficulty. Affect pleasant GU: Foley in  place.  Skin: RLE partial thickness wound 3x1.5 cm 100% pink. Dressing in place  Buttock with multiple partial thickness wounds.    Telemetry   Aflutter 100's.     EKG   Previously viewed from 02/24/2017 A Flutter 100 bpm   Labs    CBC  Recent Labs  03/02/17 0424 03/03/17 0407  WBC 21.4* 21.5*  NEUTROABS 18.8* 18.7*  HGB 8.4* 8.1*  HCT 28.1* 27.6*  MCV 77.6* 77.7*  PLT 298 681   Basic Metabolic Panel  Recent Labs  03/02/17 0424 03/02/17 1600 03/03/17 0407  NA 135 136 137  138  K 3.4* 3.3* 3.5  3.5  CL 103 105 104  105  CO2 24 26 26  26   GLUCOSE 113* 113* 99  100*  BUN 28* 23* 18  18  CREATININE 1.36* 1.17* 1.08*  1.08*  CALCIUM 8.6* 8.4* 8.4*  8.5*  MG 2.3  --  2.3  PHOS 2.6 2.1* 1.9*   Liver Function Tests  Recent Labs  03/02/17 1600 03/03/17 0407  ALBUMIN 2.2* 2.3*     BNP (last 3 results)  Recent Labs  02/16/17 1516  BNP 923.6*       Imaging   Transthoracic Echocardiography Study  Conclusions  - Left ventricle: Septal flattening consistant with elevated RV   pressures. Systolic function was moderately to severely reduced.   The estimated ejection fraction was in the range of 30% to 35%.   Diffuse hypokinesis. - Mitral valve: There was mild regurgitation. - Left atrium: The atrium was mildly dilated. - Right ventricle: The cavity size was moderately dilated. - Right atrium: The atrium was moderately dilated. - Atrial septum: No defect or patent foramen ovale was identified. - Tricuspid valve: There was moderate-severe regurgitation. - Pericardium, extracardiac: A trivial pericardial effusion was   identified. - Impressions: Suspect PA pressure underestimated by TR velocity   due to RV dysfunction. There is moderate RV enlargement with   severe hypokinesis and signs of significant cor pulmonale.  Impressions:  - Suspect PA pressure underestimated by TR velocity due to RV   dysfunction. There is moderate RV  enlargement with severe   hypokinesis and signs of significant cor pulmonale.     Medications:     Scheduled Medications: . Chlorhexidine Gluconate Cloth  6 each Topical Daily  . feeding supplement (ENSURE ENLIVE)  237 mL Oral BID BM  . feeding supplement (PRO-STAT SUGAR FREE 64)  30 mL Oral BID  . ferrous sulfate  325 mg Oral Q breakfast  . fluticasone  2 spray Each Nare Daily  . heparin subcutaneous  5,000 Units Subcutaneous Q8H  . insulin aspart  0-15 Units Subcutaneous Q4H  . levothyroxine  100 mcg Oral QAC breakfast  . multivitamin with minerals  1 tablet Oral Daily  . pantoprazole  40 mg Oral BID  . senna-docusate  1 tablet Oral BID  . sodium chloride flush  10-40 mL Intracatheter Q12H  . thiamine  100 mg Oral Daily    Infusions: . sodium chloride    . amiodarone 30 mg/hr (03/03/17 0812)  . cefTRIAXone (ROCEPHIN)  IV 1 g (03/03/17 0906)  . DOPamine 4.002 mcg/kg/min (03/03/17 0800)  . heparin 10,000 units/ 20 mL infusion syringe 1,950 Units/hr (03/03/17 0728)  . heparin    . milrinone 0.125 mcg/kg/min (03/03/17 0800)  . dialysis replacement fluid (prismasate) 500 mL/hr at 03/03/17 0625  . dialysis replacement fluid (prismasate) 300 mL/hr at 03/02/17 2000  . dialysate (PRISMASATE) 1,000 mL/hr at 03/03/17 0620    PRN Medications: sodium chloride, acetaminophen, alum & mag hydroxide-simeth, camphor-menthol, guaiFENesin-dextromethorphan, heparin, heparin, heparin, iopamidol, lip balm, ondansetron (ZOFRAN) IV, promethazine, sodium chloride flush    Patient Profile  56 y/o woman with morbid obesity and little previous medical follow-up. Admitted 6/24 with severe anemia (hgb 3.7) and cardiogenic shock due biventricular HF R>L. Echo reviewed EF 30-35% with severe RV Failure and PAH. Has anasarca and progressive renal failure with inability to mobilize fluid. Transferred to [facility] on 7/4 for further evaluation by CHF team.    Assessment/Plan  1. 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 Low volts on ECG concerning for amyloid. Creatinine too high for CMRI. Consider TPY scan when more stable.  - Co ox 74%. On milrinone 0.125 mcg. Continue same.  - Remains volume overloaded. Continue CVVHD. - 120 an hour.  - No beta blocker with acute decompensation.  - Eventually will need VQ and R heart cath and sleep study - No unna boots, has a wound on her R leg that has a daily dressing change ordered.  2. AKI  - Creatinine 1.08. Improving.  - Responding well to CCVHD.  - No blood or protein on UA.   3. Iron deficiency anemia - Hgb on admit was 3.7. With low MCV. Iron stores low. - EGD 02/20/17 with severe esophagitis  - Hgb stable at 8.1.  - Received feraheme 02/26/2017.  - Will need colonoscopy when more stable.   4. Atrial flutter - Rate just mildly elevated in setting of dopamine. Continue amio - Not on AC due to anemia.  -Will eventually need AC followed by DC-CV and possible ablation - Started SQ heparin on 02/26/2017. Stopped 02/27/17 with Hgb down to 7.3. SCDs R and LLE for DVT prophylaxis. Restarted SQ heparin 03/01/17, tolerating well.  - No change to current plan.   5. Anasarca/ascites in setting of severe protein calorie malnutition - CT abdomen without hepatic stenosis.  - Likely due to combination of RHF and fatty liver. Albumin 2.7 02/18/17 - prealbumin was < 5 on 02/18/17. - Oral intake improving.  - Repeat prealbumin   6. Hypothyroidism - Likely long-standing with spoon nails on exam. - TSH 11. Already started on synthroid - T3 1.3 T4 1.08  - Plasma cortisol 25.8 02/17/17   - No change to current plan.   7. Leukocytosis - WBC 28.5->27.7->21.4->21.5. Rocephin for UTI  -RLE wound does not appear infected. Buttock wounds appear partial thickness with evidence of induration.   PCT only 1.44 02/27/17.  - UA 02/27/17 Cloudy with few bacteria and moderate leukocytes.  - No evidence of malignancy on CT. No change.  - Peripheral smear sent. Results pending. If WBC stays up will consult Heme - Continue Rocephin   8. Severe fibroid uterus  9. Morbid obesity - Nutrition following  - Continue protein supplements.   10. RLE wound Continue foam dressing on RLE wound.  - No change to current plan.   11. Probable OSA/OHS - Bipap as needed.   12-MASD (Moisture Associated Skin Damage) Buttock Wounds- Partial thickness- dose appear infected. Appears to be related to moisture and fecal incontinence.  Will likely need barrier cream as urine leaks under dressing. Prealbumin <5.  - Wound care to see.   13. Itching - Due to AKI - Will add hydroxyzine 25 mg po q 6 hours prn. Discussed with HF Pharm D.   Length of Stay: [place], NP  03/03/2017, 9:12 AM  Advanced Heart Failure Team Pager [phone] (M-F; 7a - 4p)  Please contact [facility] for night-coverage after hours (4p -7a ) and weekends on [email or web address]  .Agree with above  She remains critically ill with acute renal failure and hypotension. BP supported with dopamine and milrinone. Volume status improving but still massively overloaded.  She remains in AFL despite amio. Currently on SQ heparin only due to bleeding. Will consider changing to therapeutic heparin soon.   Leg wounds being followed by WOC.   WBC remains elevated. May be from UTI and leg wounds but seems out of proportion. I have d/w Dr. Neysa Hotter who will see.  On exam Lethargic and tachycardic Lungs clear anteriorly Ab obese mildly distended Good BS Extremities with massive edema and leg wounds  She remains critically ill with severe malnutrition and anasarca as well as AKI and biventricular HF. Wil continue aggressive support as above. Hopefully volume removal will help turn her around.   CRITICAL CARE Performed by: Glori Bickers  Total critical care time: 35 minutes  Critical care time was exclusive of separately billable procedures  and treating other patients.  Critical care was necessary to treat or prevent imminent or life-threatening deterioration.  Critical care was time spent personally by me (independent of midlevel providers or residents) on the following activities: development of treatment plan with patient and/or surrogate as well as nursing, discussions with consultants, evaluation of patient's response to treatment, examination of patient, obtaining history from patient or surrogate, ordering and performing treatments and interventions, ordering and review of laboratory studies, ordering and review of radiographic studies, pulse oximetry and re-evaluation of patient's condition.  Glori Bickers, MD  11:51 PM

## 2017-03-04 ENCOUNTER — Inpatient Hospital Stay (HOSPITAL_COMMUNITY): Payer: Self-pay

## 2017-03-04 DIAGNOSIS — D72829 Elevated white blood cell count, unspecified: Secondary | ICD-10-CM

## 2017-03-04 LAB — POCT ACTIVATED CLOTTING TIME
ACTIVATED CLOTTING TIME: 224 s
ACTIVATED CLOTTING TIME: 219 s
ACTIVATED CLOTTING TIME: 208 s
ACTIVATED CLOTTING TIME: 197 s
ACTIVATED CLOTTING TIME: 169 s
ACTIVATED CLOTTING TIME: 158 s
ACTIVATED CLOTTING TIME: 169 s
ACTIVATED CLOTTING TIME: 191 s
ACTIVATED CLOTTING TIME: 202 s
Activated Clotting Time: 180 seconds
Activated Clotting Time: 186 seconds
Activated Clotting Time: 186 seconds
Activated Clotting Time: 186 seconds
Activated Clotting Time: 241 seconds
Activated Clotting Time: 241 seconds
Activated Clotting Time: 246 seconds

## 2017-03-04 LAB — RENAL FUNCTION PANEL
ANION GAP: 8 (ref 5–15)
ANION GAP: 7 (ref 5–15)
Albumin: 2.4 g/dL — ABNORMAL LOW (ref 3.5–5.0)
Albumin: 2.5 g/dL — ABNORMAL LOW (ref 3.5–5.0)
BUN: 12 mg/dL (ref 6–20)
BUN: 10 mg/dL (ref 6–20)
CHLORIDE: 103 mmol/L (ref 101–111)
CO2: 26 mmol/L (ref 22–32)
CO2: 26 mmol/L (ref 22–32)
CREATININE: 1.06 mg/dL — AB (ref 0.44–1.00)
Calcium: 8.6 mg/dL — ABNORMAL LOW (ref 8.9–10.3)
Calcium: 8.4 mg/dL — ABNORMAL LOW (ref 8.9–10.3)
Chloride: 103 mmol/L (ref 101–111)
Creatinine, Ser: 1.1 mg/dL — ABNORMAL HIGH (ref 0.44–1.00)
GFR calc Af Amer: >60 mL/min (ref >60)
GFR calc non Af Amer: 56 mL/min — ABNORMAL LOW (ref >60)
GFR, EST NON AFRICAN AMERICAN: 59 mL/min — AB (ref >60)
GLUCOSE: 104 mg/dL — AB (ref 65–99)
Glucose, Bld: 119 mg/dL — ABNORMAL HIGH (ref 65–99)
POTASSIUM: 3.7 mmol/L (ref 3.5–5.1)
POTASSIUM: 3.7 mmol/L (ref 3.5–5.1)
Phosphorus: 1.4 mg/dL — ABNORMAL LOW (ref 2.5–4.6)
Phosphorus: 1.1 mg/dL — ABNORMAL LOW (ref 2.5–4.6)
Sodium: 137 mmol/L (ref 135–145)
Sodium: 136 mmol/L (ref 135–145)

## 2017-03-04 LAB — DIFFERENTIAL
BASOS PCT: 0 %
Basophils Absolute: 0 10*3/uL (ref 0.0–0.1)
EOS ABS: 0.2 10*3/uL (ref 0.0–0.7)
Eosinophils Relative: 1 %
Lymphocytes Relative: 8 %
Lymphs Abs: 1.8 10*3/uL (ref 0.7–4.0)
MONO ABS: 1.6 10*3/uL — AB (ref 0.1–1.0)
Monocytes Relative: 7 %
NEUTROS ABS: 19.3 10*3/uL — AB (ref 1.7–7.7)
NEUTROS PCT: 84 %

## 2017-03-04 LAB — GLUCOSE, CAPILLARY
GLUCOSE-CAPILLARY: 107 mg/dL — AB (ref 65–99)
GLUCOSE-CAPILLARY: 114 mg/dL — AB (ref 65–99)
GLUCOSE-CAPILLARY: 96 mg/dL (ref 65–99)
GLUCOSE-CAPILLARY: 99 mg/dL (ref 65–99)
Glucose-Capillary: 94 mg/dL (ref 65–99)
Glucose-Capillary: 113 mg/dL — ABNORMAL HIGH (ref 65–99)

## 2017-03-04 LAB — COOXEMETRY PANEL
CARBOXYHEMOGLOBIN: 1.8 % — AB (ref 0.5–1.5)
Methemoglobin: 1.3 % (ref 0.0–1.5)
O2 SAT: 81.7 %
TOTAL HEMOGLOBIN: 8.3 g/dL — AB (ref 12.0–16.0)

## 2017-03-04 LAB — CBC
HCT: 28.5 % — ABNORMAL LOW (ref 36.0–46.0)
Hemoglobin: 8.4 g/dL — ABNORMAL LOW (ref 12.0–15.0)
MCH: 23.1 pg — AB (ref 26.0–34.0)
MCHC: 29.5 g/dL — ABNORMAL LOW (ref 30.0–36.0)
MCV: 78.5 fL (ref 78.0–100.0)
Platelets: 272 10*3/uL (ref 150–400)
RBC: 3.63 MIL/uL — ABNORMAL LOW (ref 3.87–5.11)
RDW: 31.7 % — AB (ref 11.5–15.5)
WBC: 22.9 10*3/uL — AB (ref 4.0–10.5)

## 2017-03-04 LAB — MAGNESIUM: Magnesium: 2.4 mg/dL (ref 1.7–2.4)

## 2017-03-04 LAB — APTT: aPTT: >200 seconds (ref 24–36)

## 2017-03-04 MED ORDER — DOPAMINE-DEXTROSE 3.2-5 MG/ML-% IV SOLN
0.0000 ug/kg/min | INTRAVENOUS | Status: DC
  Administered 2017-03-04: 7 ug/kg/min via INTRAVENOUS
  Administered 2017-03-05: 6 ug/kg/min via INTRAVENOUS
  Administered 2017-03-06 – 2017-03-09 (×4): 7 ug/kg/min via INTRAVENOUS
  Administered 2017-03-10: 2 ug/kg/min via INTRAVENOUS
  Filled 2017-03-04 (×7): qty 250

## 2017-03-04 MED ORDER — K PHOS MONO-SOD PHOS DI & MONO 155-852-130 MG PO TABS
500.0000 mg | ORAL_TABLET | Freq: Three times a day (TID) | ORAL | Status: DC
  Administered 2017-03-04 (×2): 500 mg via ORAL
  Filled 2017-03-04 (×4): qty 2

## 2017-03-04 MED ORDER — LIP MEDEX EX OINT
TOPICAL_OINTMENT | CUTANEOUS | Status: DC | PRN
  Filled 2017-03-04: qty 7

## 2017-03-04 MED ORDER — BLISTEX MEDICATED EX OINT
TOPICAL_OINTMENT | CUTANEOUS | Status: DC | PRN
  Filled 2017-03-04: qty 6.3

## 2017-03-04 MED ORDER — POTASSIUM PHOSPHATE MONOBASIC 500 MG PO TABS
500.0000 mg | ORAL_TABLET | Freq: Three times a day (TID) | ORAL | Status: DC
  Filled 2017-03-04 (×4): qty 1

## 2017-03-04 MED ORDER — MILRINONE LACTATE IN DEXTROSE 20-5 MG/100ML-% IV SOLN
0.1250 ug/kg/min | INTRAVENOUS | Status: DC
  Administered 2017-03-04 – 2017-03-06 (×2): 0.125 ug/kg/min via INTRAVENOUS
  Filled 2017-03-04 (×2): qty 100

## 2017-03-04 NOTE — Progress Notes (Signed)
PT Cancellation Note  Patient Details Name: Samantha Richards MRN: [id number] DOB: 06-12-1961   Cancelled Treatment:    Reason Eval/Treat Not Completed: Patient declined, no reason specified pt declined due to nausea and fatigue. PT will continue to follow acutely.    [place] April, PTA Pager: [phone]   03/04/2017, 4:10 PM

## 2017-03-04 NOTE — Progress Notes (Signed)
CVVHD management.  PO4 low, will replace.   SBP 80's  On milrinone and dopamine.  Awake and alert, CO nose bleeds.  Getting heparin with CVVHD and also SQ heparin.  Will DC SQ heparin.

## 2017-03-04 NOTE — Progress Notes (Signed)
Patient ID: Samantha Richards, female   DOB: [birth year], 56 y.o.   MRN: [id number]  PROGRESS NOTE  Samantha Richards  HER:[id number] DOB: [birth year] DOA: 02/16/2017  PCP: Patient, No Pcp Per  Brief Narrative:  56 y.o. female who presented on 6/24 after a fall at home found to be in hemorrhagic/hypovolemic and cardiogenic shock, severely anemic in the setting of slow GI bleed and Hgb 3.7. Transfusions and BiPAP provided in addition to vancomycin/ceftriaxone for cellulitis of the back. EGD 6/28 showed gastritis and esophagitis, and pt declined colonoscopy. Levophed was weaned 6/28 and the patient was transferred to hospitalist service 6/29. AFib with RVR has been managed by cardiology, and has converted to sinus rhythm. EF 30% with evidence of RV overload/cor pulmonale. She appears severely malnourished with anasarca with ascites, paracentesis 6/26 of 2.5L fluid with negative cytology and culture. Lasix infusion was started 7/2.  Per cardiology assessment, pt may benefit from milrinone drip and transferred to [facility] to be seen by Heart Failure Team.   Assessment & Plan:   Cardiogenic shock with acute systolic and diastolic CHF and RV overload and cor pulmonale - ECHO showed RV overload, diffusely hypokinetic and LVEF 30-35% - Pt has diffuse anasarca that is slowly improving with Hemodialysis fluid removal - Started on milrinone drip per HF team - Now on milrinone, dopamine and amiodarone drip - Pt tolerating CVVHD - Pt remains full code  Acute blood loss anemia / Acute upper GI bleed / hematemesis / Iron deficiency anemia - Symptomatic anemia with hgb of 3.7 on admission - Occult negative on admission - EGD 6/28 showed gastritis / esophagitis - GI signed off 6/29, recommended to call back when pt more stable and willing to undergo colonoscopy  - Continue Protonix 40 mg BID - Continue ferrous sulfate supplementation   Ascites - SAAG > 1.1 - Negative SBP - S/P paracentesis   Hypokalemia - repleted     CKD stage 3 - nephrology started patient on CVVHD 7/6 with good results  Generalized weakness - In the setting of acute decompensated CHF - PT recommended SNF placement when medically stabilized   Atrial fibrillation with RVR - CHADS vasc score at least 3 - On amiodarone drip, metoprolol 12.5 mg PO BID - Cardiology team following - now on subcut heparin  Cellulitis, right lower extremity / Leukocytosis  - given worsening leukocytosis, DC cephalexin, restart ceftriaxone 7/6 - Vanco and rocephin stopped but planning to restart ceftriaxone 7/6.  - urinalysis suggestive of UTI and was started on ceftriaxone 7/6  Leukocytosis - appreciate hem/onc consult, likely this is stress reaction, will continue to observe  Hypothyroidism - TSH 11.7 - Started levothyroxine, recommending to recheck TSH in 6-8 weeks  Severe protein calorie malnutrition  - Counseled on diet - Seen by nutritionist  - Encouraged oral feeding and supplements as appetite is improving  Stage 2 pressure skin injury to bilateral buttocks and right lower extremity  - Seen by wound care, appreciate their assessment - Bilateral buttocks 18 cm x 30 cm with partial thickness tissue loss - Right lateral and posterior foot with 15 cm x 15 cm area of maceration with serous exudate - Added bariatric mattress, moisture barrier ointment   DVT prophylaxis: heparin Code Status: full code  Family Communication: no family at the bedside during rounds Disposition Plan: TBD   Consultants:   Cardiology, HF team  GI, [place]   Pulmonary / CCM   Nutrition  PT - SNF recommended   [place]  Nephrology  Procedures:   Paracentesis 6/26 - 2.5 L fluid removed   B/ LE venous doppler 6/25 - no DVT  EGD 6/28 - distal esophagitis and gastritis   Antimicrobials:   Vanco 6/24 --> 6/28  Rocephin 6/24 --> 7/2, 7/4-->  Cephalexin 7/2 -->7/4  Subjective: Pt says that she is no longer having headache and she is feeling a lot  better, she says that she is hopeful to try to eat more today  Objective: Vitals:   03/04/17 0400 03/04/17 0500 03/04/17 0600 03/04/17 0700  BP: (!) 89/73 (!) 85/64 90/66   Pulse: (!) 105 (!) 105 (!) 105 (!) 105  Resp: 16 14 19 15   Temp: 97.6 F (36.4 C)     TempSrc: Oral     SpO2: 97% 98% 94% 97%  Weight:  107.2 kg (236 lb 5.3 oz)    Height:        Intake/Output Summary (Last 24 hours) at 03/04/17 0732 Last data filed at 03/04/17 0700  Gross per 24 hour  Intake          1071.69 ml  Output             3830 ml  Net         -2758.31 ml   Filed Weights   03/02/17 0300 03/03/17 0500 03/04/17 0500  Weight: 110.9 kg (244 lb 7.8 oz) 110.7 kg (244 lb 0.8 oz) 107.2 kg (236 lb 5.3 oz)    Examination:  General exam: Appears calm and comfortable, NAD.  Respiratory system: no increased work of breathing.   Cardiovascular system: irregular, S1 & S2 heard, Rate controlled, (+) JVD Gastrointestinal system: Abdomen is distended, soft and nontender. No organomegaly or masses felt. Normal bowel sounds heard. Central nervous system: Alert and oriented. No focal neurological deficits. Extremities: 1+ pitting edema B/L LE Skin: slightly improved cracked scaling skin BLEs with chronic venous stasis changes.  Psychiatry: flat affect.  Data Reviewed: I have personally reviewed following labs and imaging studies  CBC:  Recent Labs Lab 02/28/17 0352 03/01/17 0331 03/02/17 0424 03/03/17 0407 03/04/17 0417  WBC 28.5* 27.8* 21.4* 21.5* 22.9*  NEUTROABS 25.9* 25.3* 18.8* 18.7* 19.3*  HGB 8.1* 8.3* 8.4* 8.1* 8.4*  HCT 27.1* 27.8* 28.1* 27.6* 28.5*  MCV 76.3* 76.0* 77.6* 77.7* 78.5  PLT 337 336 298 308 161   Basic Metabolic Panel:  Recent Labs Lab 02/28/17 0352  03/01/17 0331  03/02/17 0424 03/02/17 1600 03/03/17 0407 03/03/17 1600 03/04/17 0417  NA 136  < > 136  < > 135 136 137  138 138 137  K 3.5  < > 3.3*  < > 3.4* 3.3* 3.5  3.5 3.6 3.7  CL 105  < > 105  < > 103 105 104   105 104 103  CO2 21*  < > 23  < > 24 26 26  26 26 26   GLUCOSE 120*  < > 114*  < > 113* 113* 99  100* 99 104*  BUN 58*  < > 46*  < > 28* 23* 18  18 14 12   CREATININE 2.46*  < > 1.94*  < > 1.36* 1.17* 1.08*  1.08* 0.97 1.10*  CALCIUM 9.0  < > 8.9  < > 8.6* 8.4* 8.4*  8.5* 8.4* 8.6*  MG 2.3  --  2.2  --  2.3  --  2.3  --  2.4  PHOS  --   < > 3.5  < > 2.6 2.1* 1.9* 1.3* 1.4*  < > =  values in this interval not displayed. GFR: Estimated Creatinine Clearance: 72 mL/min (A) (by C-G formula based on SCr of 1.1 mg/dL (H)). Liver Function Tests:  Recent Labs Lab 03/02/17 0424 03/02/17 1600 03/03/17 0407 03/03/17 1600 03/04/17 0417  ALBUMIN 2.3* 2.2* 2.3* 2.3* 2.4*   No results for input(s): LIPASE, AMYLASE in the last 168 hours. No results for input(s): AMMONIA in the last 168 hours. Coagulation Profile: No results for input(s): INR, PROTIME in the last 168 hours. Cardiac Enzymes: No results for input(s): CKTOTAL, CKMB, CKMBINDEX, TROPONINI in the last 168 hours. BNP (last 3 results) No results for input(s): PROBNP in the last 8760 hours. HbA1C: No results for input(s): HGBA1C in the last 72 hours. CBG:  Recent Labs Lab 03/03/17 1129 03/03/17 1650 03/03/17 2028 03/04/17 0010 03/04/17 0401  GLUCAP 102* 83 96 96 99   Lipid Profile: No results for input(s): CHOL, HDL, LDLCALC, TRIG, CHOLHDL, LDLDIRECT in the last 72 hours. Thyroid Function Tests: No results for input(s): TSH, T4TOTAL, FREET4, T3FREE, THYROIDAB in the last 72 hours. Anemia Panel: No results for input(s): VITAMINB12, FOLATE, FERRITIN, TIBC, IRON, RETICCTPCT in the last 72 hours. Urine analysis:    Component Value Date/Time   COLORURINE AMBER (A) 02/27/2017 1900   APPEARANCEUR CLOUDY (A) 02/27/2017 1900   LABSPEC 1.020 02/27/2017 1900   PHURINE 5.0 02/27/2017 1900   GLUCOSEU NEGATIVE 02/27/2017 1900   HGBUR SMALL (A) 02/27/2017 1900   BILIRUBINUR NEGATIVE 02/27/2017 1900   KETONESUR NEGATIVE 02/27/2017  1900   PROTEINUR 30 (A) 02/27/2017 1900   NITRITE NEGATIVE 02/27/2017 1900   LEUKOCYTESUR MODERATE (A) 02/27/2017 1900    No results found for this or any previous visit (from the past 240 hour(s)).  Radiology Studies: Dg Chest Port 1 View Result Date: 02/22/2017 Probable bibasilar atelectasis.   Scheduled Meds: . Chlorhexidine Gluconate Cloth  6 each Topical Daily  . feeding supplement (ENSURE ENLIVE)  237 mL Oral BID BM  . feeding supplement (PRO-STAT SUGAR FREE 64)  30 mL Oral BID  . ferrous sulfate  325 mg Oral Q breakfast  . fluticasone  2 spray Each Nare Daily  . heparin subcutaneous  5,000 Units Subcutaneous Q8H  . insulin aspart  0-15 Units Subcutaneous Q4H  . levothyroxine  100 mcg Oral QAC breakfast  . multivitamin with minerals  1 tablet Oral Daily  . pantoprazole  40 mg Oral BID  . senna-docusate  1 tablet Oral BID  . sodium chloride flush  10-40 mL Intracatheter Q12H  . thiamine  100 mg Oral Daily   Continuous Infusions: . sodium chloride    . amiodarone 30 mg/hr (03/04/17 0400)  . cefTRIAXone (ROCEPHIN)  IV Stopped (03/03/17 0936)  . DOPamine 7 mcg/kg/min (03/04/17 0400)  . heparin 10,000 units/ 20 mL infusion syringe 1,400 Units/hr (03/04/17 0700)  . heparin    . milrinone 0.125 mcg/kg/min (03/04/17 0000)  . dialysis replacement fluid (prismasate) 500 mL/hr at 03/04/17 0230  . dialysis replacement fluid (prismasate) 300 mL/hr at 03/04/17 0615  . dialysate (PRISMASATE) 1,000 mL/hr at 03/04/17 0727     LOS: 16 days   Critical Care Time spent: 33 minutes   Irwin Brakeman, MD [facility] Pager [phone]  If 7PM-7AM, please contact night-coverage [email or web address] Password [email or web address] 03/04/2017, 7:32 AM

## 2017-03-04 NOTE — Progress Notes (Signed)
Advanced Heart Failure Rounding Note  PCP:  Primary Cardiologist: Dr Acie Fredrickson   Subjective:    02/26/17 started on milrinone and placed on lasix drip.   Evening of 02/27/17 pts pressures fell requiring more dopamine, became less responsive and UOP worsening. Pt transferred to [place].   CVVHD started 7/6   Continue on CVVHD. Had multiple nosebleeds last night.   Remains on milrinone 0.125 mcg. Dopamine up from  8mcg -> 80mcg . Denies SOB but feels weak. Poor appetite   Rates improved  Objective:   Weight Range: 107.2 kg (236 lb 5.3 oz) Body mass index is 39.33 kg/m.   Vital Signs:   Temp:  [97.6 F (36.4 C)-98.4 F (36.9 C)] 97.6 F (36.4 C) (07/10 0400) Pulse Rate:  [105-115] 105 (07/10 0700) Resp:  [14-22] 20 (07/10 0800) BP: (82-112)/(50-73) 112/67 (07/10 0800) SpO2:  [89 %-99 %] 97 % (07/10 0800) Weight:  [107.2 kg (236 lb 5.3 oz)] 107.2 kg (236 lb 5.3 oz) (07/10 0500) Last BM Date: 02/25/17  Weight change: Filed Weights   03/02/17 0300 03/03/17 0500 03/04/17 0500  Weight: 110.9 kg (244 lb 7.8 oz) 110.7 kg (244 lb 0.8 oz) 107.2 kg (236 lb 5.3 oz)    Intake/Output:   Intake/Output Summary (Last 24 hours) at 03/04/17 0852 Last data filed at 03/04/17 0800  Gross per 24 hour  Intake          1078.39 ml  Output             3809 ml  Net         -2730.61 ml     Physical Exam   CVP 14 General:Ill appearing female, NAD. Lying in bed.  HEENT:Normal.  Neck: Supple, JVP to ear.  RIJ trialysis cath  No thyromegaly or nodule noted. Cor: PMI nonpalpable.Tachy, regular. No M/R/G's.  Lungs: Clear in upper lobes, diminished in bilateral bases.  Abdomen: Obese, soft, nontender. + bowel sounds.  Extremities: No cyanosis or clubbing.Spoon nails.  4+ edema to thighs.  Neuro: Alert and oriented x 3.  cranial nerves grossly intact. moves all 4 extremities w/o difficulty. Affect pleasant GU: Foley in place.  Skin: RLE partial thickness wound 3x1.5 cm 100% pink. Dressing in  place  Buttock with multiple partial thickness wounds.    Telemetry   AFL 100-110 Personally reviewed    EKG   Previously viewed from 02/24/2017 A Flutter 100 bpm   Labs    CBC  Recent Labs  03/03/17 0407 03/04/17 0417  WBC 21.5* 22.9*  NEUTROABS 18.7* 19.3*  HGB 8.1* 8.4*  HCT 27.6* 28.5*  MCV 77.7* 78.5  PLT 308 419   Basic Metabolic Panel  Recent Labs  03/03/17 0407 03/03/17 1600 03/04/17 0417  NA 137  138 138 137  K 3.5  3.5 3.6 3.7  CL 104  105 104 103  CO2 26  26 26 26   GLUCOSE 99  100* 99 104*  BUN 18  18 14 12   CREATININE 1.08*  1.08* 0.97 1.10*  CALCIUM 8.4*  8.5* 8.4* 8.6*  MG 2.3  --  2.4  PHOS 1.9* 1.3* 1.4*   Liver Function Tests  Recent Labs  03/03/17 1600 03/04/17 0417  ALBUMIN 2.3* 2.4*     BNP (last 3 results)  Recent Labs  02/16/17 1516  BNP 923.6*       Imaging   Transthoracic Echocardiography Study Conclusions  - Left ventricle: Septal flattening consistant with elevated RV   pressures. Systolic function  was moderately to severely reduced.   The estimated ejection fraction was in the range of 30% to 35%.   Diffuse hypokinesis. - Mitral valve: There was mild regurgitation. - Left atrium: The atrium was mildly dilated. - Right ventricle: The cavity size was moderately dilated. - Right atrium: The atrium was moderately dilated. - Atrial septum: No defect or patent foramen ovale was identified. - Tricuspid valve: There was moderate-severe regurgitation. - Pericardium, extracardiac: A trivial pericardial effusion was   identified. - Impressions: Suspect PA pressure underestimated by TR velocity   due to RV dysfunction. There is moderate RV enlargement with   severe hypokinesis and signs of significant cor pulmonale.  Impressions:  - Suspect PA pressure underestimated by TR velocity due to RV   dysfunction. There is moderate RV enlargement with severe   hypokinesis and signs of significant cor  pulmonale.     Medications:     Scheduled Medications: . Chlorhexidine Gluconate Cloth  6 each Topical Daily  . feeding supplement (ENSURE ENLIVE)  237 mL Oral BID BM  . feeding supplement (PRO-STAT SUGAR FREE 64)  30 mL Oral BID  . ferrous sulfate  325 mg Oral Q breakfast  . fluticasone  2 spray Each Nare Daily  . insulin aspart  0-15 Units Subcutaneous Q4H  . levothyroxine  100 mcg Oral QAC breakfast  . multivitamin with minerals  1 tablet Oral Daily  . pantoprazole  40 mg Oral BID  . potassium phosphate (monobasic)  500 mg Oral TID WC & HS  . senna-docusate  1 tablet Oral BID  . sodium chloride flush  10-40 mL Intracatheter Q12H  . thiamine  100 mg Oral Daily    Infusions: . sodium chloride    . amiodarone 30 mg/hr (03/04/17 0800)  . cefTRIAXone (ROCEPHIN)  IV Stopped (03/03/17 0936)  . DOPamine 7 mcg/kg/min (03/04/17 0800)  . heparin 10,000 units/ 20 mL infusion syringe 1,400 Units/hr (03/04/17 0700)  . heparin    . milrinone 0.125 mcg/kg/min (03/04/17 0800)  . dialysis replacement fluid (prismasate) 500 mL/hr at 03/04/17 0230  . dialysis replacement fluid (prismasate) 300 mL/hr at 03/04/17 0615  . dialysate (PRISMASATE) 1,000 mL/hr at 03/04/17 0727    PRN Medications: sodium chloride, acetaminophen, alum & mag hydroxide-simeth, camphor-menthol, guaiFENesin-dextromethorphan, heparin, heparin, heparin, hydrOXYzine, iopamidol, lip balm, ondansetron (ZOFRAN) IV, promethazine, sodium chloride flush    Patient Profile  56 y/o woman with morbid obesity and little previous medical follow-up. Admitted 6/24 with severe anemia (hgb 3.7) and cardiogenic shock due biventricular HF R>L. Echo reviewed EF 30-35% with severe RV Failure and PAH. Has anasarca and progressive renal failure with inability to mobilize fluid. Transferred to [facility] on 7/4 for further evaluation by CHF team.    Assessment/Plan  1. 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 Low volts on ECG concerning for amyloid. Creatinine too high for CMRI. Consider TPY scan when more stable.  - Co ox 81%. On dopamine 7. milrinone 0.125 mcg. Titrate as needed  - Remains volume overloaded. Continue CVVHD. Weight down another 8 pounds but still massive amounts of volume to go. Discussed with Dr. Mercy Moore.  - No beta blocker with acute decompensation.  - Eventually will need VQ and R heart cath and sleep study - No unna boots, has a wound on her R leg that has a  daily dressing change ordered.   2. AKI  - Oliguric. Remains on CVVHD.  - Appreciate Renal input  3. Iron deficiency anemia - Hgb on admit was 3.7. With low MCV. Iron stores low. - EGD 02/20/17 with severe esophagitis  - Hgb stable at 8.3 - Received feraheme 02/26/2017.  - Will need colonoscopy when more stable.   4. Atrial flutter - Rate just mildly elevated in setting of dopamine. Continue amio - On heparin through CVVHD. SQ heparin stopped.  - Will eventually need  DC-CV and possible ablation  5. Anasarca/ascites in setting of severe protein calorie malnutition - CT abdomen without hepatic stenosis.  - Likely due to combination of RHF and fatty liver. Albumin 2.7 02/18/17 - prealbumin was < 5 on 02/18/17. - Oral intake improving.  - Repeat prealbumin  - Will she need TPN?   7. Leukocytosis - Persistently elevated Rocephin for UTI  -RLE wound does not appear infected. Buttock wounds appear partial thickness with evidence of induration.  PCT only 1.44 02/27/17.  - Seen by Dr. Beryle Beams on 7/9. Appreciate input Smear ok No evidence of toxic process or malignancy at this point   8. Severe fibroid uterus  9. Morbid obesity - Nutrition following  - Continue protein supplements.   10. RLE wound Continue foam dressing on RLE wound.  - No change to current plan.    11. Probable OSA/OHS - Bipap as needed.   12-MASD (Moisture Associated Skin Damage) Buttock Wounds- Partial thickness- dose appear infected. Appears to be related to moisture and fecal incontinence.  Will likely need barrier cream as urine leaks under dressing. Prealbumin <5.  - Wound care to see.   CRITICAL CARE Performed by: Glori Bickers  Total critical care time: 35 minutes  Critical care time was exclusive of separately billable procedures and treating other patients.  Critical care was necessary to treat or prevent imminent or life-threatening deterioration.  Critical care was time spent personally by me (independent of midlevel providers or residents) on the following activities: development of treatment plan with patient and/or surrogate as well as nursing, discussions with consultants, evaluation of patient's response to treatment, examination of patient, obtaining history from patient or surrogate, ordering and performing treatments and interventions, ordering and review of laboratory studies, ordering and review of radiographic studies, pulse oximetry and re-evaluation of patient's condition.    Length of Stay: 16  Glori Bickers, MD  03/04/2017, 8:52 AM  Advanced Heart Failure Team Pager [phone] (M-F; 7a - 4p)  Please contact [facility] for night-coverage after hours (4p -7a ) and weekends on [email or web address]

## 2017-03-05 LAB — RENAL FUNCTION PANEL
ANION GAP: 6 (ref 5–15)
ANION GAP: 7 (ref 5–15)
Albumin: 2.4 g/dL — ABNORMAL LOW (ref 3.5–5.0)
Albumin: 2.1 g/dL — ABNORMAL LOW (ref 3.5–5.0)
BUN: 9 mg/dL (ref 6–20)
BUN: 8 mg/dL (ref 6–20)
CALCIUM: 8.5 mg/dL — AB (ref 8.9–10.3)
CHLORIDE: 104 mmol/L (ref 101–111)
CHLORIDE: 99 mmol/L — AB (ref 101–111)
CO2: 27 mmol/L (ref 22–32)
CO2: 27 mmol/L (ref 22–32)
CREATININE: 1.09 mg/dL — AB (ref 0.44–1.00)
Calcium: 8.3 mg/dL — ABNORMAL LOW (ref 8.9–10.3)
Creatinine, Ser: 1.08 mg/dL — ABNORMAL HIGH (ref 0.44–1.00)
GFR calc non Af Amer: 58 mL/min — ABNORMAL LOW (ref >60)
GFR, EST NON AFRICAN AMERICAN: 57 mL/min — AB (ref >60)
GLUCOSE: 152 mg/dL — AB (ref 65–99)
Glucose, Bld: 113 mg/dL — ABNORMAL HIGH (ref 65–99)
POTASSIUM: 3.7 mmol/L (ref 3.5–5.1)
Phosphorus: 1.5 mg/dL — ABNORMAL LOW (ref 2.5–4.6)
Phosphorus: 2.3 mg/dL — ABNORMAL LOW (ref 2.5–4.6)
Potassium: 3.8 mmol/L (ref 3.5–5.1)
SODIUM: 133 mmol/L — AB (ref 135–145)
Sodium: 137 mmol/L (ref 135–145)

## 2017-03-05 LAB — POCT ACTIVATED CLOTTING TIME
ACTIVATED CLOTTING TIME: 241 s
ACTIVATED CLOTTING TIME: 230 s
ACTIVATED CLOTTING TIME: 230 s
ACTIVATED CLOTTING TIME: 219 s
ACTIVATED CLOTTING TIME: 224 s
ACTIVATED CLOTTING TIME: 202 s
ACTIVATED CLOTTING TIME: 208 s
Activated Clotting Time: 169 seconds
Activated Clotting Time: 186 seconds
Activated Clotting Time: 197 seconds
Activated Clotting Time: 197 seconds
Activated Clotting Time: 208 seconds
Activated Clotting Time: 219 seconds
Activated Clotting Time: 219 seconds

## 2017-03-05 LAB — CBC
HCT: 27.9 % — ABNORMAL LOW (ref 36.0–46.0)
Hemoglobin: 8.1 g/dL — ABNORMAL LOW (ref 12.0–15.0)
MCH: 23.3 pg — ABNORMAL LOW (ref 26.0–34.0)
MCHC: 29 g/dL — ABNORMAL LOW (ref 30.0–36.0)
MCV: 80.2 fL (ref 78.0–100.0)
PLATELETS: 275 10*3/uL (ref 150–400)
RBC: 3.48 MIL/uL — AB (ref 3.87–5.11)
RDW: 32.8 % — AB (ref 11.5–15.5)
WBC: 18.8 10*3/uL — ABNORMAL HIGH (ref 4.0–10.5)

## 2017-03-05 LAB — GLUCOSE, CAPILLARY
GLUCOSE-CAPILLARY: 142 mg/dL — AB (ref 65–99)
GLUCOSE-CAPILLARY: 147 mg/dL — AB (ref 65–99)
Glucose-Capillary: 96 mg/dL (ref 65–99)
Glucose-Capillary: 158 mg/dL — ABNORMAL HIGH (ref 65–99)

## 2017-03-05 LAB — MAGNESIUM
Magnesium: 2.3 mg/dL (ref 1.7–2.4)
Magnesium: 2.4 mg/dL (ref 1.7–2.4)

## 2017-03-05 LAB — COOXEMETRY PANEL
Carboxyhemoglobin: 1.9 % — ABNORMAL HIGH (ref 0.5–1.5)
Methemoglobin: 1.1 % (ref 0.0–1.5)
O2 Saturation: 79.1 %
TOTAL HEMOGLOBIN: 8.1 g/dL — AB (ref 12.0–16.0)

## 2017-03-05 LAB — PHOSPHORUS: Phosphorus: 2.3 mg/dL — ABNORMAL LOW (ref 2.5–4.6)

## 2017-03-05 LAB — APTT

## 2017-03-05 MED ORDER — THIAMINE HCL 100 MG/ML IJ SOLN
100.0000 mg | Freq: Every day | INTRAMUSCULAR | Status: DC
  Administered 2017-03-05: 100 mg via INTRAVENOUS
  Filled 2017-03-05: qty 2

## 2017-03-05 MED ORDER — POTASSIUM PHOSPHATES 15 MMOLE/5ML IV SOLN
20.0000 mmol | Freq: Once | INTRAVENOUS | Status: DC
  Filled 2017-03-05: qty 6.67

## 2017-03-05 MED ORDER — MENTHOL 3 MG MT LOZG
1.0000 | LOZENGE | OROMUCOSAL | Status: DC | PRN
Start: 2017-03-05 — End: 2017-03-13
  Filled 2017-03-05: qty 9

## 2017-03-05 MED ORDER — INSULIN ASPART 100 UNIT/ML ~~LOC~~ SOLN
0.0000 [IU] | SUBCUTANEOUS | Status: DC
  Administered 2017-03-05 – 2017-03-09 (×15): 2 [IU] via SUBCUTANEOUS
  Administered 2017-03-10: 3 [IU] via SUBCUTANEOUS
  Administered 2017-03-10 (×2): 2 [IU] via SUBCUTANEOUS
  Administered 2017-03-10 (×2): 3 [IU] via SUBCUTANEOUS
  Administered 2017-03-10: 2 [IU] via SUBCUTANEOUS
  Administered 2017-03-11 (×3): 3 [IU] via SUBCUTANEOUS
  Administered 2017-03-11: 2 [IU] via SUBCUTANEOUS
  Administered 2017-03-11 – 2017-03-12 (×2): 3 [IU] via SUBCUTANEOUS
  Administered 2017-03-12 (×2): 2 [IU] via SUBCUTANEOUS
  Administered 2017-03-12: 3 [IU] via SUBCUTANEOUS
  Administered 2017-03-12 – 2017-03-13 (×4): 2 [IU] via SUBCUTANEOUS

## 2017-03-05 MED ORDER — PRO-STAT SUGAR FREE PO LIQD
60.0000 mL | Freq: Two times a day (BID) | ORAL | Status: DC
  Administered 2017-03-06 – 2017-03-07 (×3): 60 mL
  Filled 2017-03-05 (×4): qty 60

## 2017-03-05 MED ORDER — POTASSIUM PHOSPHATES 15 MMOLE/5ML IV SOLN
20.0000 mmol | Freq: Once | INTRAVENOUS | Status: AC
  Administered 2017-03-05: 20 mmol via INTRAVENOUS
  Filled 2017-03-05: qty 6.67

## 2017-03-05 MED ORDER — VITAL 1.5 CAL PO LIQD
1000.0000 mL | ORAL | Status: DC
  Administered 2017-03-05 – 2017-03-06 (×2): 1000 mL
  Filled 2017-03-05 (×5): qty 1000

## 2017-03-05 MED ORDER — PANTOPRAZOLE SODIUM 40 MG IV SOLR
40.0000 mg | Freq: Two times a day (BID) | INTRAVENOUS | Status: DC
  Administered 2017-03-05 (×2): 40 mg via INTRAVENOUS
  Filled 2017-03-05 (×2): qty 40

## 2017-03-05 MED ORDER — LEVOTHYROXINE SODIUM 100 MCG IV SOLR
50.0000 ug | Freq: Every day | INTRAVENOUS | Status: DC
  Administered 2017-03-05: 50 ug via INTRAVENOUS
  Filled 2017-03-05: qty 5

## 2017-03-05 MED ORDER — SENNOSIDES-DOCUSATE SODIUM 8.6-50 MG PO TABS: 1.0000 | ORAL_TABLET | Freq: Two times a day (BID) | ORAL | Status: DC

## 2017-03-05 MED ORDER — PHENOL 1.4 % MT LIQD: 1.0000 | OROMUCOSAL | Status: DC | PRN

## 2017-03-05 NOTE — Progress Notes (Signed)
Advanced Heart Failure Rounding Note  PCP:  Primary Cardiologist: Dr Acie Fredrickson   Subjective:    02/26/17 started on milrinone and placed on lasix drip.   Evening of 02/27/17 pts pressures fell requiring more dopamine, became less responsive and UOP worsening. Pt transferred to [place].   CVVHD started 7/6   Continue on CVVHD.   She remains on milrinone 0.125 mcg + dopamine 7 mcg. Todays CO-OX 79%.   Overall feeling ok. Poor appetite. Having some nausea.   Objective:   Weight Range: 101.7 kg (224 lb 3.3 oz) Body mass index is 37.31 kg/m.   Vital Signs:   Temp:  [97.4 F (36.3 C)-98.2 F (36.8 C)] 97.6 F (36.4 C) (07/11 0349) Pulse Rate:  [101-114] 101 (07/11 0800) Resp:  [15-25] 17 (07/11 0800) BP: (75-110)/(57-83) 104/73 (07/11 0800) SpO2:  [80 %-100 %] 98 % (07/11 0800) Weight:  [101.7 kg (224 lb 3.3 oz)] 101.7 kg (224 lb 3.3 oz) (07/11 0500) Last BM Date: 03/02/17 (per patient)  Weight change: Filed Weights   03/03/17 0500 03/04/17 0500 03/05/17 0500  Weight: 110.7 kg (244 lb 0.8 oz) 107.2 kg (236 lb 5.3 oz) 101.7 kg (224 lb 3.3 oz)    Intake/Output:   Intake/Output Summary (Last 24 hours) at 03/05/17 0832 Last data filed at 03/05/17 0800  Gross per 24 hour  Intake              898 ml  Output             3451 ml  Net            -2553 ml     Physical Exam   CVP 14 General: Chronically ill.  No resp difficulty HEENT: normal Neck: supple. JVP to ear Carotids 2+ bilat; no bruits. No lymphadenopathy or thryomegaly appreciated. Cor: PMI nondisplaced. Regular rate & rhythm. No rubs, gallops or murmurs. Lungs: clear Abdomen: obese, soft, nontender, nondistended. No hepatosplenomegaly. No bruits or masses. Good bowel sounds. Extremities: no cyanosis, clubbing, rash, R and L extremities 3+ edema.  Skin: RLE dressing. Buttcok multiple partial thickness wounds.    Telemetry   A Flutter 100s. Personally reviewed.     EKG   Previously viewed from 02/24/2017 A  Flutter 100 bpm   Labs    CBC  Recent Labs  03/03/17 0407 03/04/17 0417  WBC 21.5* 22.9*  NEUTROABS 18.7* 19.3*  HGB 8.1* 8.4*  HCT 27.6* 28.5*  MCV 77.7* 78.5  PLT 308 951   Basic Metabolic Panel  Recent Labs  03/04/17 0417 03/04/17 1842 03/05/17 0347  NA 137 136 137  K 3.7 3.7 3.8  CL 103 103 104  CO2 26 26 27   GLUCOSE 104* 119* 113*  BUN 12 10 9   CREATININE 1.10* 1.06* 1.09*  CALCIUM 8.6* 8.4* 8.5*  MG 2.4  --  2.3  PHOS 1.4* 1.1* 1.5*   Liver Function Tests  Recent Labs  03/04/17 1842 03/05/17 0347  ALBUMIN 2.5* 2.4*     BNP (last 3 results)  Recent Labs  02/16/17 1516  BNP 923.6*       Imaging   Transthoracic Echocardiography Study Conclusions  - Left ventricle: Septal flattening consistant with elevated RV   pressures. Systolic function was moderately to severely reduced.   The estimated ejection fraction was in the range of 30% to 35%.   Diffuse hypokinesis. - Mitral valve: There was mild regurgitation. - Left atrium: The atrium was mildly dilated. - Right ventricle: The cavity  size was moderately dilated. - Right atrium: The atrium was moderately dilated. - Atrial septum: No defect or patent foramen ovale was identified. - Tricuspid valve: There was moderate-severe regurgitation. - Pericardium, extracardiac: A trivial pericardial effusion was   identified. - Impressions: Suspect PA pressure underestimated by TR velocity   due to RV dysfunction. There is moderate RV enlargement with   severe hypokinesis and signs of significant cor pulmonale.  Impressions:  - Suspect PA pressure underestimated by TR velocity due to RV   dysfunction. There is moderate RV enlargement with severe   hypokinesis and signs of significant cor pulmonale.     Medications:     Scheduled Medications: . Chlorhexidine Gluconate Cloth  6 each Topical Daily  . feeding supplement (ENSURE ENLIVE)  237 mL Oral BID BM  . feeding supplement (PRO-STAT  SUGAR FREE 64)  30 mL Oral BID  . ferrous sulfate  325 mg Oral Q breakfast  . fluticasone  2 spray Each Nare Daily  . insulin aspart  0-15 Units Subcutaneous Q4H  . levothyroxine  100 mcg Oral QAC breakfast  . multivitamin with minerals  1 tablet Oral Daily  . pantoprazole  40 mg Oral BID  . phosphorus  500 mg Oral TID WC & HS  . senna-docusate  1 tablet Oral BID  . sodium chloride flush  10-40 mL Intracatheter Q12H  . thiamine  100 mg Oral Daily    Infusions: . sodium chloride    . amiodarone 30 mg/hr (03/05/17 0700)  . cefTRIAXone (ROCEPHIN)  IV Stopped (03/04/17 0948)  . DOPamine 7 mcg/kg/min (03/05/17 0700)  . heparin 10,000 units/ 20 mL infusion syringe 1,650 Units/hr (03/05/17 0743)  . heparin    . milrinone 0.125 mcg/kg/min (03/05/17 0700)  . dialysis replacement fluid (prismasate) 500 mL/hr at 03/04/17 2356  . dialysis replacement fluid (prismasate) 300 mL/hr at 03/05/17 0010  . dialysate (PRISMASATE) 1,000 mL/hr at 03/05/17 0433    PRN Medications: sodium chloride, acetaminophen, alum & mag hydroxide-simeth, camphor-menthol, guaiFENesin-dextromethorphan, heparin, heparin, heparin, hydrOXYzine, iopamidol, lip balm, ondansetron (ZOFRAN) IV, promethazine, sodium chloride flush    Patient Profile  56 y/o woman with morbid obesity and little previous medical follow-up. Admitted 6/24 with severe anemia (hgb 3.7) and cardiogenic shock due biventricular HF R>L. Echo reviewed EF 30-35% with severe RV Failure and PAH. Has anasarca and progressive renal failure with inability to mobilize fluid. Transferred to [facility] on 7/4 for further evaluation by CHF team.    Assessment/Plan  1. 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 Low volts on ECG concerning for amyloid. Creatinine too high for CMRI. Consider TPY scan  when more stable.  - Todays CO-OX is 79%. Continue dopamine 7 mcg and milrinone 0.125 mcg.  CVP 14-15.  Continue CVVHD. Pulling 120. Weight down another 12 pounds.   - No beta blocker with acute decompensation.  - Eventually will need VQ and R heart cath and sleep study  2. AKI  - Oliguric. Remains on CVVHD.  - Appreciate Renal input  3. Iron deficiency anemia - Hgb on admit was 3.7. With low MCV. Iron stores low. - EGD 02/20/17 with severe esophagitis  - Received feraheme 02/26/2017.  -CBC in am.  - Will need colonoscopy when more stable.   4. Atrial flutter - Rate just mildly elevated in setting of dopamine. Continue  amio. Complaining of nausea ? Amio.  - On heparin through CVVHD.  - Will eventually need  DC-CV and possible ablation  5. Anasarca/ascites in setting of severe protein calorie malnutition - CT abdomen without hepatic stenosis.  - Likely due to combination of RHF and fatty liver. Albumin 2.7 02/18/17 - prealbumin was < 5 on 02/18/17. - Recheck Prealbumin.   7. Leukocytosis - Persistently elevated Rocephin for UTI  -RLE wound does not appear infected. Buttock wounds appear partial thickness with evidence of induration.  PCT only 1.44 02/27/17.  - Seen by Dr. Beryle Beams on 7/9. Appreciate input Smear ok. No evidence of toxic process or malignancy at this point   8. Severe fibroid uterus  9. Morbid obesity - Nutrition following  - Continue protein supplements.   10. RLE wound Continue foam dressing on RLE wound.  - No change to current plan.   11. Probable OSA/OHS - Bipap as needed.   12-MASD (Moisture Associated Skin Damage) Buttock Wounds- Partial thickness- dose appear infected. Appears to be related to moisture and fecal incontinence.  Will likely need barrier cream as urine leaks under dressing. Prealbumin <5.  Wound Care appreciated.    Length of Stay: [place], NP  03/05/2017, 8:32 AM  Advanced Heart Failure Team Pager [phone] (M-F; 7a -  4p)  Please contact [facility] for night-coverage after hours (4p -7a ) and weekends on [email or web address]  Agree. She remains markedly volume overloaded. Co-ox improving with hemodynamic support.  Continue to pull with CVVHD.   Continue amio for AFL. Will eventually need TEE and DC-CV  Nutritional status remains quite poor. Encourage po intake. Will ask nutrition to see again. I think we may need to start tube feeds.   D/w Nephrology.   CRITICAL CARE Performed by: Glori Bickers  Total critical care time: 35 minutes  Critical care time was exclusive of separately billable procedures and treating other patients.  Critical care was necessary to treat or prevent imminent or life-threatening deterioration.  Critical care was time spent personally by me (independent of midlevel providers or residents) on the following activities: development of treatment plan with patient and/or surrogate as well as nursing, discussions with consultants, evaluation of patient's response to treatment, examination of patient, obtaining history from patient or surrogate, ordering and performing treatments and interventions, ordering and review of laboratory studies, ordering and review of radiographic studies, pulse oximetry and re-evaluation of patient's condition.  Glori Bickers, MD  9:02 AM

## 2017-03-05 NOTE — Progress Notes (Signed)
CVVHD management.  PO4 better but still sl low, on replacement but says PO is difficult. Will switch to IV.  SBP better, mostly upper 90's-low 100's.   On milrinone and dopamine.  Awake and alert.  Nose bleeding improved.  Still able to pull 120cc/hr net neg. UO minimal, discussed the fact that will most likely need IHD after volume controlled with CVVHD.

## 2017-03-05 NOTE — Progress Notes (Signed)
Nutrition Follow-up  DOCUMENTATION CODES:   Obesity unspecified  INTERVENTION:   Start Vital 1.5 @ 20 ml/hr and increase by 10 ml every 12 hours to goal rate of 50 ml/hr (1200 ml/day)  60 ml Prostat BID  Provides: 2200 kcal, 141 grams protein, and 916 ml free water.   Monitor magnesium and phosphorus every 12 hours x 4 occurances, MD to replete as needed, as pt is at risk for refeeding syndrome given prolonged poor intake.  NUTRITION DIAGNOSIS:   Inadequate oral intake related to acute illness, poor appetite as evidenced by per patient/family report. Ongoing.   GOAL:   Patient will meet greater than or equal to 90% of their needs Not met.   MONITOR:   PO intake, Supplement acceptance, Weight trends, Labs, Skin, I & O's  REASON FOR ASSESSMENT:   Consult Enteral/tube feeding initiation and management (Possible TF)  ASSESSMENT:   56 year old female with PMH of Obesity presents to ED on 6/24 with complaints of weakness and pain for the last 3 days. 2 weeks prior had a mechanical fall which resulted in right knee pain. Patient reports that she has not had anything to eat or drink as she has been laying in bed for 3 days unable to move, reports she lives with a roommate however they refused to help. EMS arrived to house on 6/21 and found patient laying face down on an air-mattress, however refused help. EMS reports that living situation was poor and house was in un-livable conditions.  Acute HF with ascites/anasarca, severe esophagitis 6/28 7/6 started CVVHD, also on milrinone and dopamine Per pt and RN she has a very poor appetite and nausea. Pt has been refusing food and medications by mouth.  Pt is agreeable to a feeding tube, did introduce myself and told pt another RD would be by to place her tube.   Pt currently working with PT.  Pt may meet criteria for malnutrition but unable to complete physical exam at this time.   Medications reviewed and include: MVI, senokot-s,  thimaine  Labs reviewed: PO4 1.5 (L) - receiving replacement  Very poor intake throughout admission (17 days)  Diet Order:  Diet Heart Room service appropriate? Yes; Fluid consistency: Thin; Fluid restriction: 2000 mL Fluid  Skin:   (stage II bilateral buttocks, non-pressure wound leg, MASD groin/leg)  Last BM:  7/8 per pt   Height:   Ht Readings from Last 1 Encounters:  03/01/17 5' 5"  (1.651 m)    Weight:   Wt Readings from Last 1 Encounters:  03/05/17 224 lb 3.3 oz (101.7 kg)    Ideal Body Weight:  56.82 kg  BMI:  Body mass index is 37.31 kg/m.  Estimated Nutritional Needs:   Kcal:  0626-9485  Protein:  125-150 grams  Fluid:  per MD  EDUCATION NEEDS:   Education needs addressed  Maylon Peppers RD, [place], [place] Pager [phone] After Hours Pager

## 2017-03-05 NOTE — Progress Notes (Addendum)
CRITICAL VALUE ALERT  Critical Value:  aPTT >200  Date & Time Notied:  03/05/17 @ 04:30  Provider Notified: Dr. Posey Pronto with nephrology - verbal order to leave as is.

## 2017-03-05 NOTE — Progress Notes (Signed)
Cortrak Tube Team Note:  Consult received to place a Cortrak feeding tube.   A 10 F Cortrak tube was placed in the left nare and secured with a nasal bridle at 85 cm. Per the Cortrak monitor reading the tube tip is post pyloric.   No x-ray is required. RN may begin using tube.   If the tube becomes dislodged please keep the tube and contact the Cortrak team at [email or web address] ([email or web address]) for replacement.  If after hours and replacement cannot be delayed, place a NG tube and confirm placement with an abdominal x-ray.   Mariana Single RD, LDN Pager # - [phone]

## 2017-03-05 NOTE — Progress Notes (Signed)
Patient ID: Samantha Richards, female   DOB: 1961/07/29, 56 y.o.   MRN: [id number]  PROGRESS NOTE  Samantha Richards  GHW:[id number] DOB: [birth year] DOA: 02/16/2017  PCP: Patient, No Pcp Per  Brief Narrative:  56 y.o. female who presented on 6/24 after a fall at home found to be in hemorrhagic/hypovolemic and cardiogenic shock, severely anemic in the setting of slow GI bleed and Hgb 3.7. Transfusions and BiPAP provided in addition to vancomycin/ceftriaxone for cellulitis of the back. EGD 6/28 showed gastritis and esophagitis, and pt declined colonoscopy. Levophed was weaned 6/28 and the patient was transferred to hospitalist service 6/29. AFib with RVR has been managed by cardiology, and has converted to sinus rhythm. EF 30% with evidence of RV overload/cor pulmonale. She appears severely malnourished with anasarca with ascites, paracentesis 6/26 of 2.5L fluid with negative cytology and culture. Lasix infusion was started 7/2.  Per cardiology assessment, pt may benefit from milrinone drip and transferred to [facility] to be seen by Heart Failure Team.   Assessment & Plan:   Cardiogenic shock with acute systolic and diastolic CHF and RV overload and cor pulmonale - ECHO showed RV overload, diffusely hypokinetic and LVEF 30-35% - Pt has diffuse anasarca that is slowly improving with Hemodialysis fluid removal - Started on milrinone drip per HF team - Now on milrinone, dopamine and amiodarone drip - Pt tolerating CVVHD - Pt remains full code - Weight down from 107.8 kg 6/24 to 101.7 kg 7/11  Acute blood loss anemia / Acute upper GI bleed / hematemesis / Iron deficiency anemia - Symptomatic anemia with hgb of 3.7 on admission - Occult negative on admission - EGD 6/28 showed gastritis / esophagitis - GI signed off 6/29, recommended to call back when pt more stable and willing to undergo colonoscopy  - Continue Protonix 40 mg BID - Continue ferrous sulfate supplementation   Ascites - SAAG > 1.1 - Negative  SBP - S/P paracentesis   Hypokalemia - repleted    CKD stage 3 - nephrology started patient on CVVHD 7/6 with good results  Generalized weakness - In the setting of acute decompensated CHF - PT recommended SNF placement when medically stabilized   Atrial fibrillation with RVR - CHADS vasc score at least 3 - On amiodarone drip, metoprolol 12.5 mg PO BID - Cardiology team following - now on subcut heparin  Cellulitis, right lower extremity / Leukocytosis  - given worsening leukocytosis, DC cephalexin, restart ceftriaxone 7/6 - Vanco and rocephin stopped but planning to restart ceftriaxone 7/6.  - urinalysis suggestive of UTI and was started on ceftriaxone 7/6, DC ceftriaxone after today's dose  Epistaxis - Improving, I suspect this is related to heparin used for CVVHD, subcut heparin discontinued.  Leukocytosis - appreciate hem/onc consult, likely this is stress reaction, will continue to observe, check CBC this morning  Hypothyroidism - TSH 11.7 - Started levothyroxine, recommending to recheck TSH in 6-8 weeks  Severe protein calorie malnutrition  - Counseled on diet - Seen by nutritionist  - Encouraged oral feeding and supplements as appetite is improving  Stage 2 pressure skin injury to bilateral buttocks and right lower extremity  - Seen by wound care, appreciate their assessment - Bilateral buttocks 18 cm x 30 cm with partial thickness tissue loss - Right lateral and posterior foot with 15 cm x 15 cm area of maceration with serous exudate - Added bariatric mattress, moisture barrier ointment   DVT prophylaxis: heparin Code Status: full code  Family Communication: no family at  the bedside during rounds Disposition Plan: TBD but likely will need SNF   Consultants:   Cardiology, HF team  GI, [facility]   Pulmonary / CCM   Nutrition  PT - SNF recommended   [place]  Nephrology  Procedures:   Paracentesis 6/26 - 2.5 L fluid removed   B/ LE venous doppler 6/25 -  no DVT  EGD 6/28 - distal esophagitis and gastritis   Antimicrobials:   Vanco 6/24 --> 6/28  Rocephin 6/24 --> 7/2, 7/4-->  Cephalexin 7/2 -->7/4  Subjective: Pt says that she has been having a trickling nosebleed all night long but she feels much better overall  Objective: Vitals:   03/05/17 0400 03/05/17 0500 03/05/17 0600 03/05/17 0700  BP: 95/66 96/71 99/66  100/83  Pulse:  (!) 110 (!) 105 (!) 106  Resp: 18 16 (!) 22 17  Temp:      TempSrc:      SpO2:  90% 94% 100%  Weight:  101.7 kg (224 lb 3.3 oz)    Height:        Intake/Output Summary (Last 24 hours) at 03/05/17 0712 Last data filed at 03/05/17 0700  Gross per 24 hour  Intake            934.7 ml  Output             3449 ml  Net          -2514.3 ml   Filed Weights   03/03/17 0500 03/04/17 0500 03/05/17 0500  Weight: 110.7 kg (244 lb 0.8 oz) 107.2 kg (236 lb 5.3 oz) 101.7 kg (224 lb 3.3 oz)    Examination:  General exam: Appears calm and comfortable, NAD. Face appears much less edematous.  Respiratory system: no increased work of breathing.   Cardiovascular system: irregular, S1 & S2 heard, Rate controlled, (+) JVD Gastrointestinal system: Abdomen is distended, soft and nontender. No organomegaly or masses felt. Normal bowel sounds heard. Central nervous system: Alert and oriented. No focal neurological deficits. Extremities: 1+ pitting edema B/L LE Skin: slightly improved cracked scaling skin BLEs with chronic venous stasis changes. Edema improving daily.  Psychiatry: flat affect seems a little better every day  Data Reviewed: I have personally reviewed following labs and imaging studies  CBC:  Recent Labs Lab 02/28/17 0352 03/01/17 0331 03/02/17 0424 03/03/17 0407 03/04/17 0417  WBC 28.5* 27.8* 21.4* 21.5* 22.9*  NEUTROABS 25.9* 25.3* 18.8* 18.7* 19.3*  HGB 8.1* 8.3* 8.4* 8.1* 8.4*  HCT 27.1* 27.8* 28.1* 27.6* 28.5*  MCV 76.3* 76.0* 77.6* 77.7* 78.5  PLT 337 336 298 308 564   Basic Metabolic  Panel:  Recent Labs Lab 03/01/17 0331  03/02/17 0424  03/03/17 0407 03/03/17 1600 03/04/17 0417 03/04/17 1842 03/05/17 0347  NA 136  < > 135  < > 137  138 138 137 136 137  K 3.3*  < > 3.4*  < > 3.5  3.5 3.6 3.7 3.7 3.8  CL 105  < > 103  < > 104  105 104 103 103 104  CO2 23  < > 24  < > 26  26 26 26 26 27   GLUCOSE 114*  < > 113*  < > 99  100* 99 104* 119* 113*  BUN 46*  < > 28*  < > 18  18 14 12 10 9   CREATININE 1.94*  < > 1.36*  < > 1.08*  1.08* 0.97 1.10* 1.06* 1.09*  CALCIUM 8.9  < > 8.6*  < >  8.4*  8.5* 8.4* 8.6* 8.4* 8.5*  MG 2.2  --  2.3  --  2.3  --  2.4  --  2.3  PHOS 3.5  < > 2.6  < > 1.9* 1.3* 1.4* 1.1* 1.5*  < > = values in this interval not displayed. GFR: Estimated Creatinine Clearance: 70.6 mL/min (A) (by C-G formula based on SCr of 1.09 mg/dL (H)). Liver Function Tests:  Recent Labs Lab 03/03/17 0407 03/03/17 1600 03/04/17 0417 03/04/17 1842 03/05/17 0347  ALBUMIN 2.3* 2.3* 2.4* 2.5* 2.4*   No results for input(s): LIPASE, AMYLASE in the last 168 hours. No results for input(s): AMMONIA in the last 168 hours. Coagulation Profile: No results for input(s): INR, PROTIME in the last 168 hours. Cardiac Enzymes: No results for input(s): CKTOTAL, CKMB, CKMBINDEX, TROPONINI in the last 168 hours. BNP (last 3 results) No results for input(s): PROBNP in the last 8760 hours. HbA1C: No results for input(s): HGBA1C in the last 72 hours. CBG:  Recent Labs Lab 03/04/17 0401 03/04/17 0818 03/04/17 1213 03/04/17 1543 03/04/17 2339  GLUCAP 99 94 107* 113* 114*   Lipid Profile: No results for input(s): CHOL, HDL, LDLCALC, TRIG, CHOLHDL, LDLDIRECT in the last 72 hours. Thyroid Function Tests: No results for input(s): TSH, T4TOTAL, FREET4, T3FREE, THYROIDAB in the last 72 hours. Anemia Panel: No results for input(s): VITAMINB12, FOLATE, FERRITIN, TIBC, IRON, RETICCTPCT in the last 72 hours. Urine analysis:    Component Value Date/Time   COLORURINE  AMBER (A) 02/27/2017 1900   APPEARANCEUR CLOUDY (A) 02/27/2017 1900   LABSPEC 1.020 02/27/2017 1900   PHURINE 5.0 02/27/2017 1900   GLUCOSEU NEGATIVE 02/27/2017 1900   HGBUR SMALL (A) 02/27/2017 1900   BILIRUBINUR NEGATIVE 02/27/2017 1900   KETONESUR NEGATIVE 02/27/2017 1900   PROTEINUR 30 (A) 02/27/2017 1900   NITRITE NEGATIVE 02/27/2017 1900   LEUKOCYTESUR MODERATE (A) 02/27/2017 1900    No results found for this or any previous visit (from the past 240 hour(s)).  Radiology Studies: Dg Chest Port 1 View Result Date: 02/22/2017 Probable bibasilar atelectasis.   Scheduled Meds: . Chlorhexidine Gluconate Cloth  6 each Topical Daily  . feeding supplement (ENSURE ENLIVE)  237 mL Oral BID BM  . feeding supplement (PRO-STAT SUGAR FREE 64)  30 mL Oral BID  . ferrous sulfate  325 mg Oral Q breakfast  . fluticasone  2 spray Each Nare Daily  . insulin aspart  0-15 Units Subcutaneous Q4H  . levothyroxine  100 mcg Oral QAC breakfast  . multivitamin with minerals  1 tablet Oral Daily  . pantoprazole  40 mg Oral BID  . phosphorus  500 mg Oral TID WC & HS  . senna-docusate  1 tablet Oral BID  . sodium chloride flush  10-40 mL Intracatheter Q12H  . thiamine  100 mg Oral Daily   Continuous Infusions: . sodium chloride    . amiodarone 30 mg/hr (03/05/17 0700)  . cefTRIAXone (ROCEPHIN)  IV Stopped (03/04/17 0948)  . DOPamine 7 mcg/kg/min (03/05/17 0700)  . heparin 10,000 units/ 20 mL infusion syringe 1,700 Units/hr (03/05/17 0600)  . heparin    . milrinone 0.125 mcg/kg/min (03/05/17 0700)  . dialysis replacement fluid (prismasate) 500 mL/hr at 03/04/17 2356  . dialysis replacement fluid (prismasate) 300 mL/hr at 03/05/17 0010  . dialysate (PRISMASATE) 1,000 mL/hr at 03/05/17 0433     LOS: 17 days   Critical Care Time spent: 31 minutes   Irwin Brakeman, MD [facility] Pager [phone]  If 7PM-7AM, please  contact night-coverage [email or web address] Password [facility] 03/05/2017,  7:12 AM

## 2017-03-05 NOTE — Progress Notes (Signed)
Physical Therapy Treatment Patient Details Name: Samantha Richards MRN: [id number] DOB: [birth year] Today's Date: 03/05/2017    History of Present Illness patient is a 56 y.o. female who presented on 6/24 after a fall at home found to be in hemorrhagic/hypovolemic and cardiogenic shock, severely anemic in the setting of weeks of slow GI bleed and Hgb 3.7.with anemia after a fall also found to have Acute systolic CHF and anasarca    PT Comments    Patient limited to bed level activity today. Pt tolerated bilat LE therex with assistance from therapist. Pt with pain in hips with abduction and demonstrated decreased ROM in bilat LE. Pt encouraged to work on LE therex throughout the day. PT will continue to follow acutely.    Follow Up Recommendations  SNF     Equipment Recommendations  Wheelchair (measurements PT);Hospital bed    Recommendations for Other Services       Precautions / Restrictions Precautions Precautions: Fall    Mobility  Bed Mobility               General bed mobility comments: +2 total A to scoot pt up in bed with RN assistance and use of bed pad  Transfers                 General transfer comment: pt limited to bed level activity  Ambulation/Gait                 Stairs            Wheelchair Mobility    Modified Rankin (Stroke Patients Only)       Balance Overall balance assessment:  (not assessed)                                          Cognition Arousal/Alertness: Awake/alert Behavior During Therapy: WFL for tasks assessed/performed Overall Cognitive Status: Within Functional Limits for tasks assessed                                        Exercises General Exercises - Lower Extremity Ankle Circles/Pumps: AROM;Both;20 reps Quad Sets: AROM;Both;10 reps Heel Slides: AAROM;Both;10 reps Hip ABduction/ADduction: AAROM;Both;10 reps    General Comments        Pertinent Vitals/Pain Pain  Assessment: Faces Faces Pain Scale: Hurts little more Pain Location: hips with therex Pain Descriptors / Indicators: Grimacing;Guarding Pain Intervention(s): Limited activity within patient's tolerance;Monitored during session;Repositioned    Home Living                      Prior Function            PT Goals (current goals can now be found in the care plan section) Progress towards PT goals: Progressing toward goals    Frequency    Min 3X/week      PT Plan Current plan remains appropriate    Co-evaluation              AM-PAC PT "6 Clicks" Daily Activity  Outcome Measure  Difficulty turning over in bed (including adjusting bedclothes, sheets and blankets)?: Total Difficulty moving from lying on back to sitting on the side of the bed? : Total Difficulty sitting down on and standing up from a chair with arms (e.g., wheelchair, bedside commode,  etc,.)?: Total Help needed moving to and from a bed to chair (including a wheelchair)?: Total Help needed walking in hospital room?: Total Help needed climbing 3-5 steps with a railing? : Total 6 Click Score: 6    End of Session   Activity Tolerance: Patient tolerated treatment well Patient left: in bed;with call bell/phone within reach;with nursing/sitter in room Nurse Communication: Mobility status PT Visit Diagnosis: Difficulty in walking, not elsewhere classified (R26.2);Muscle weakness (generalized) (M62.81)     Time: 0722-5750 PT Time Calculation (min) (ACUTE ONLY): 26 min  Charges:  $Therapeutic Exercise: 8-22 mins $Therapeutic Activity: 8-22 mins                    G Codes:       Samantha Richards, PTA Pager: [phone]     Samantha Richards 03/05/2017, 1:46 PM

## 2017-03-06 LAB — GLUCOSE, CAPILLARY
GLUCOSE-CAPILLARY: 103 mg/dL — AB (ref 65–99)
GLUCOSE-CAPILLARY: 137 mg/dL — AB (ref 65–99)
GLUCOSE-CAPILLARY: 137 mg/dL — AB (ref 65–99)
Glucose-Capillary: 139 mg/dL — ABNORMAL HIGH (ref 65–99)
Glucose-Capillary: 134 mg/dL — ABNORMAL HIGH (ref 65–99)
Glucose-Capillary: 130 mg/dL — ABNORMAL HIGH (ref 65–99)

## 2017-03-06 LAB — RENAL FUNCTION PANEL
Albumin: 2.3 g/dL — ABNORMAL LOW (ref 3.5–5.0)
Albumin: 2.4 g/dL — ABNORMAL LOW (ref 3.5–5.0)
Anion gap: 6 (ref 5–15)
Anion gap: 6 (ref 5–15)
BUN: 8 mg/dL (ref 6–20)
BUN: 8 mg/dL (ref 6–20)
CHLORIDE: 103 mmol/L (ref 101–111)
CHLORIDE: 102 mmol/L (ref 101–111)
CO2: 27 mmol/L (ref 22–32)
CO2: 28 mmol/L (ref 22–32)
CREATININE: 1.16 mg/dL — AB (ref 0.44–1.00)
Calcium: 8.3 mg/dL — ABNORMAL LOW (ref 8.9–10.3)
Calcium: 8.5 mg/dL — ABNORMAL LOW (ref 8.9–10.3)
Creatinine, Ser: 1.08 mg/dL — ABNORMAL HIGH (ref 0.44–1.00)
GFR calc Af Amer: >60 mL/min (ref >60)
GFR calc non Af Amer: 58 mL/min — ABNORMAL LOW (ref >60)
GFR calc non Af Amer: 53 mL/min — ABNORMAL LOW (ref >60)
GLUCOSE: 140 mg/dL — AB (ref 65–99)
Glucose, Bld: 140 mg/dL — ABNORMAL HIGH (ref 65–99)
POTASSIUM: 3.9 mmol/L (ref 3.5–5.1)
POTASSIUM: 4 mmol/L (ref 3.5–5.1)
Phosphorus: 1.7 mg/dL — ABNORMAL LOW (ref 2.5–4.6)
Phosphorus: 2.2 mg/dL — ABNORMAL LOW (ref 2.5–4.6)
Sodium: 136 mmol/L (ref 135–145)
Sodium: 136 mmol/L (ref 135–145)

## 2017-03-06 LAB — CBC
HCT: 26 % — ABNORMAL LOW (ref 36.0–46.0)
HEMOGLOBIN: 7.7 g/dL — AB (ref 12.0–15.0)
MCH: 23.1 pg — AB (ref 26.0–34.0)
MCHC: 29.6 g/dL — ABNORMAL LOW (ref 30.0–36.0)
MCV: 78.1 fL (ref 78.0–100.0)
Platelets: 263 10*3/uL (ref 150–400)
RBC: 3.33 MIL/uL — AB (ref 3.87–5.11)
RDW: 32.8 % — ABNORMAL HIGH (ref 11.5–15.5)
WBC: 19.8 10*3/uL — ABNORMAL HIGH (ref 4.0–10.5)

## 2017-03-06 LAB — POCT ACTIVATED CLOTTING TIME
ACTIVATED CLOTTING TIME: 219 s
ACTIVATED CLOTTING TIME: 197 s
Activated Clotting Time: 197 seconds
Activated Clotting Time: 191 seconds
Activated Clotting Time: 197 seconds
Activated Clotting Time: 191 seconds

## 2017-03-06 LAB — MAGNESIUM
Magnesium: 2.3 mg/dL (ref 1.7–2.4)
Magnesium: 2.3 mg/dL (ref 1.7–2.4)

## 2017-03-06 LAB — COOXEMETRY PANEL
CARBOXYHEMOGLOBIN: 1.9 % — AB (ref 0.5–1.5)
METHEMOGLOBIN: 1.1 % (ref 0.0–1.5)
O2 Saturation: 78.5 %
TOTAL HEMOGLOBIN: 7.7 g/dL — AB (ref 12.0–16.0)

## 2017-03-06 LAB — PHOSPHORUS
PHOSPHORUS: 1.7 mg/dL — AB (ref 2.5–4.6)
PHOSPHORUS: 4.2 mg/dL (ref 2.5–4.6)

## 2017-03-06 LAB — APTT: aPTT: >200 seconds (ref 24–36)

## 2017-03-06 MED ORDER — MILRINONE LACTATE IN DEXTROSE 20-5 MG/100ML-% IV SOLN
0.1250 ug/kg/min | INTRAVENOUS | Status: DC
  Administered 2017-03-07 – 2017-03-09 (×3): 0.125 ug/kg/min via INTRAVENOUS
  Filled 2017-03-06 (×3): qty 100

## 2017-03-06 MED ORDER — LEVOTHYROXINE SODIUM 100 MCG PO TABS
100.0000 ug | ORAL_TABLET | Freq: Every day | ORAL | Status: DC
  Administered 2017-03-06 – 2017-03-07 (×2): 100 ug
  Filled 2017-03-06 (×2): qty 1

## 2017-03-06 MED ORDER — PANTOPRAZOLE SODIUM 40 MG PO PACK
40.0000 mg | PACK | Freq: Two times a day (BID) | ORAL | Status: DC
  Administered 2017-03-06 – 2017-03-07 (×3): 40 mg
  Filled 2017-03-06 (×3): qty 20

## 2017-03-06 MED ORDER — K PHOS MONO-SOD PHOS DI & MONO 155-852-130 MG PO TABS
500.0000 mg | ORAL_TABLET | Freq: Three times a day (TID) | ORAL | Status: DC
  Administered 2017-03-06 – 2017-03-08 (×7): 500 mg via ORAL
  Filled 2017-03-06 (×13): qty 2

## 2017-03-06 MED ORDER — SENNOSIDES 8.8 MG/5ML PO SYRP
5.0000 mL | ORAL_SOLUTION | Freq: Two times a day (BID) | ORAL | Status: DC
  Administered 2017-03-06 – 2017-03-07 (×2): 5 mL
  Filled 2017-03-06 (×2): qty 5

## 2017-03-06 MED ORDER — ADULT MULTIVITAMIN LIQUID CH
15.0000 mL | Freq: Every day | ORAL | Status: DC
  Administered 2017-03-06 – 2017-03-07 (×2): 15 mL via ORAL
  Filled 2017-03-06 (×2): qty 15

## 2017-03-06 MED ORDER — PROMETHAZINE HCL 25 MG/ML IJ SOLN
12.5000 mg | Freq: Four times a day (QID) | INTRAMUSCULAR | Status: DC | PRN
  Administered 2017-03-06 – 2017-03-07 (×2): 25 mg via INTRAVENOUS
  Administered 2017-03-10: 12.5 mg via INTRAVENOUS
  Administered 2017-03-11: 25 mg via INTRAVENOUS
  Filled 2017-03-06 (×6): qty 1

## 2017-03-06 MED ORDER — DEXTROSE 5 % IV SOLN
20.0000 mmol | Freq: Once | INTRAVENOUS | Status: AC
  Administered 2017-03-06: 20 mmol via INTRAVENOUS
  Filled 2017-03-06: qty 6.67

## 2017-03-06 MED ORDER — VITAMIN B-1 100 MG PO TABS
100.0000 mg | ORAL_TABLET | Freq: Every day | ORAL | Status: DC
  Administered 2017-03-06 – 2017-03-07 (×2): 100 mg
  Filled 2017-03-06 (×2): qty 1

## 2017-03-06 NOTE — Progress Notes (Signed)
eLink Physician-Brief Progress Note Patient Name: Samantha Richards DOB: 1961-04-17 MRN: [id number]   Date of Service  03/06/2017  HPI/Events of Note  Loose stools  eICU Interventions  Rectal tube     Intervention Category Minor Interventions: Routine modifications to care plan (e.g. PRN medications for pain, fever)  Simonne Maffucci 03/06/2017, 11:54 PM

## 2017-03-06 NOTE — Progress Notes (Signed)
PULMONARY / CRITICAL CARE MEDICINE   Name: Samantha Richards MRN: [id number] DOB: 04-08-1961    ADMISSION DATE:  02/16/2017 CONSULTATION DATE:  02/27/17  REFERRING MD:  Dr. Wynetta Emery  CHIEF COMPLAINT:  Hypotension  BRIEF SUMMARY:   Samantha Richards is a 56 y.o. female who presented to [facility] on 6/24 after a fall at home and was found to be in hemorrhagic/hypovolemic and cardiogenic shock. She was severely anemic to Hgb 3.7 in the setting of GI bleed. Requiring transfusions, pressors and BiPAP in ICU. She was also treated for cellulitis of the back. Levophed was weaned 6/28 and the patient was transferred to hospitalist service 6/29. Cardiology has been managing AFib with RVR with amiodarone gtt and biventricular heart failure R>L with lasix gtt. She had cardiogenic shock prompting transfer to [facility] on 7/4 for further evaluation by CHF team and initiation of milrinone and dopamine gtt. She also has anasarca and progressive renal failure with oliguria and hypotension.  Nephrology consulted, CVVHD initiated 7/6.    SUBJECTIVE:  No events overnight, pressors and CVVH in place, PCCM asked to take over given pressor and CVVH need.  VITAL SIGNS: BP 99/65   Pulse (!) 108   Temp 98 F (36.7 C) (Oral)   Resp 14   Ht 5\' 5"  (1.651 m)   Wt 102.4 kg (225 lb 12 oz)   LMP 01/16/2017 Comment: neg preg 02-16-2017  SpO2 97%   BMI 37.57 kg/m   HEMODYNAMICS: CVP:  [14 mmHg-15 mmHg] 15 mmHg  VENTILATOR SETTINGS:    INTAKE / OUTPUT: I/O last 3 completed shifts: In: 2663.2 [P.O.:30; I.V.:1224; NG/GT:902.5; IV Piggyback:506.7] Out: 6173 [Urine:60; Other:6113]  PHYSICAL EXAMINATION: General: Chronically ill appearing female, NAD in bed HEENT: [place]/AT, PERRL, EOM-I and MMM, JVD PSY: Calm and appropriate Neuro: A&O x3, moving all ext to command CV: RRR, Nl S1/S2, -M/R/G. PULM: Bibasilar crackles posteriorily GI: Soft, NT, ND and +BS, NGT in place Extremities: warm/dry, anasarca  Skin: no rashes or  lesions  LABS:  BMET  Recent Labs Lab 03/05/17 0347 03/05/17 1600 03/06/17 0310  NA 137 133* 136  K 3.8 3.7 3.9  CL 104 99* 103  CO2 27 27 27   BUN 9 8 8   CREATININE 1.09* 1.08* 1.08*  GLUCOSE 113* 152* 140*   Electrolytes  Recent Labs Lab 03/05/17 0347 03/05/17 1600 03/06/17 0310  CALCIUM 8.5* 8.3* 8.3*  MG 2.3 2.4 2.3  PHOS 1.5* 2.3*  2.3* 1.7*  1.7*   CBC  Recent Labs Lab 03/04/17 0417 03/05/17 0730 03/06/17 0310  WBC 22.9* 18.8* 19.8*  HGB 8.4* 8.1* 7.7*  HCT 28.5* 27.9* 26.0*  PLT 272 275 263   Coag's  Recent Labs Lab 03/04/17 0417 03/05/17 0347 03/06/17 0310  APTT >200* >200* >200*   Sepsis Markers  Recent Labs Lab 02/27/17 2338 02/27/17 2347 02/28/17 0353 03/01/17 0331 03/03/17 0407  LATICACIDVEN  --  0.8 0.8  --   --   PROCALCITON 1.44  --   --  1.30 0.40   ABG  Recent Labs Lab 02/28/17 0100  PHART 7.363  PCO2ART 36.2  PO2ART 53.9*   Liver Enzymes  Recent Labs Lab 03/05/17 0347 03/05/17 1600 03/06/17 0310  ALBUMIN 2.4* 2.1* 2.3*   Cardiac Enzymes No results for input(s): TROPONINI, PROBNP in the last 168 hours.  Glucose  Recent Labs Lab 03/05/17 0738 03/05/17 1624 03/05/17 1956 03/05/17 2350 03/06/17 0347 03/06/17 0851  GLUCAP 96 158* 147* 142* 139* 134*   Imaging No results found.  STUDIES:  XR right Knee 6/24 >> No Acute CT abd/pelvis 6/25 >> anasarca with b/l pleural effusions, ascities. Fibroid uterus. Cardiomegaly with severe R atrial dilation. TTE 6/25 >> EF30-35%, diffuse hypokinesis, LA dilated, mild MR, RA mod dilated with signs of significant cor pulmonale. LE venous doppler 6/25 >> no DVT or SVT b/l US Paracentesis 6/26 >> 2.5 L peritoneal fluid yielded, cytology negative EGD 6/28 >> intense distal esophagitis with focal hemorrhagic hemorrhage, small hiatal hernia CXR 7/5 >> Cardiomegaly with vascular congestion, R midlung atelectasis with LLL opacity.  CULTURES: MRSA PCR neg 6/24 Blood  cultures 6/24 > 1 of 2 coag neg staph, likely contaminate Peritoneal fluid culture 6/26 > No growth 5 days, neg for AFB HIV 6/29 > negative  ANTIBIOTICS: Vancomycin 6/24 > 6/28 Rocephin 6/25 > 7/2 Keflex 7/2 > 7/4 Rocephin 7/6 >>  SIGNIFICANT EVENTS: 6/24  Presents to [facility], admitted to ICU  6/29  Off vasopressors, transferred out to SDU 7/02  Lasix gtt initiated 7/04  Transferred to [facility]. Milrinone started 7/05  Dopamine started, transfer to ICU 7/06  Diffuse pain, requires pressor support with dopamine and a lasix drip.  Nausea.  CVVHD initiated.  7/07  Removed 2.3L in last 24 hours, remains positive balance.    LINES/TUBES: L IJ CVL 6/26 >> Foley 6/24 >> R IJ HD 7/6 >>   DISCUSSION: 56 y.o. female who presented after a fall at home found to be in hemorrhagic/hypovolemic with severe anemia and cardiogenic shock due to biventricular heart failure. Now with worsening anasarca and progressive renal failure with oliguria and hypotension.  CVVHD initiated 7/6.    ASSESSMENT / PLAN:  PULMONARY A: Suspected OHS/OSA - intolerant of CPAP while inpatient (may have been related to fluid status) P:   Monitor in ICU given hypotension and anasarca Wean O2 for sats  Pulmonary hygiene - IS, mobilize  CARDIOVASCULAR A:  Acute systolic heart failure with RV overload/cor pulmonale  Afib with RVR, improved Cardiogenic shock  P:  Cardiology / CHF following  Continue CRRT with volume positive as long as hemodynamics will allow Tele monitoring  CVP Q Shift, daily weights MAP goal >65  Amiodarone, Dopamine, Milrinone per Cardiology Fluid / Na restriction   RENAL A:   Acute Kidney Injury Oliguria P:  CVVHD per Nephrology  Trend BMP / urinary output Replace electrolytes as indicated Avoid nephrotoxic agents as able, ensure adequate renal perfusion  GASTROINTESTINAL A:   Upper GI bleed on EGD Severe protein-calorie malnutrition with kwashiokor Hepatic steatosis Ascites >  cytology negative Peritoneal mets vs uterine fibroids P:   BID PPI  Nutrition following, promote adequate nutrition   HEMATOLOGIC A:   Anemia, likely multifactorial from GI bleed and chronic malnutrition P:  Trend CBC  Transfuse for Hgb <7 Continue Iron Peripheral smear with elliptocytes, polychromasia SCD's  INFECTIOUS A:   Skin breakdown/wounds Cellulitis RLE Stage II Decubitus to Bilateral Buttock, RLE UTI  P:   Wound care for BLE Monitor skin break down from severe anasarca D/C abx  ENDOCRINE A:   Mild hyperglycemia Hypothyroid P:   Continue synthroid  Will need follow up TSH in 6 weeks > around 8/1  SSI   NEUROLOGIC A:   Severe Deconditioning  P:   PT efforts   FAMILY  - Updates: Patient updated bedside   The patient is critically ill with multiple organ systems failure and requires high complexity decision making for assessment and support, frequent evaluation and titration of therapies, application of advanced monitoring technologies  and extensive interpretation of multiple databases.   Critical Care Time devoted to patient care services described in this note is  35  Minutes. This time reflects time of care of this signee Dr Jennet Maduro. This critical care time does not reflect procedure time, or teaching time or supervisory time of PA/NP/Med student/Med Resident etc but could involve care discussion time.  Rush Farmer, M.D. [facility] Pulmonary/Critical Care Medicine. Pager: [phone]. After hours pager: [phone].  03/06/2017, 10:46 AM

## 2017-03-06 NOTE — Progress Notes (Signed)
Patient is under ICU care. D/w Dr. Nelda Marseille from PCCM who kindly agreed to take over the case. hospitalist will sign off  Samantha Richards

## 2017-03-06 NOTE — Progress Notes (Signed)
[facility] PROGRESS NOTE  Samantha Richards HYQ:[id number] DOB: 02-05-1961 DOA: 02/16/2017 PCP: Patient, No Pcp Per  Brief Narrative:  56 y.o.femalewho presented on 6/24 after a fall at home found to be in hemorrhagic/hypovolemic and cardiogenic shock, severely anemic in the setting of slow GI bleed and Hgb 3.7. Transfusions and BiPAP provided in addition to vancomycin/ceftriaxone for cellulitis of the back. EGD 6/28 showed gastritis and esophagitis, and pt declined colonoscopy. Levophed was weaned 6/28 and the patient was transferred to hospitalist service 6/29. AFib with RVR has been managed by cardiology, and has converted to sinus rhythm. EF 30% with evidence of RV overload/cor pulmonale. She appears severely malnourished with anasarca with ascites, paracentesis 6/26 of 2.5L fluid with negative cytology and culture. Received iv diuresis, milrinone. However, patient remains critically ill on on pressure support, CRRT. Will discuss with PCCM to transfer the service   Assessment & Plan:   Cardiogenic shock with acute systolic and diastolic CHF and RV overload and cor pulmonale - ECHO showed RV overload, diffusely hypokinetic and LVEF 30-35% - Pt has diffuse anasarca that is slowly improving with Hemodialysis fluid removal - on milrinone, dopamine and amiodarone drip - Pt tolerating CVVHD - Pt remains full code - per nephrology, heart failure team   Acute blood loss anemia / Acute upper GI bleed / hematemesis / Iron deficiency anemia - Symptomatic anemia with hgb of 3.7 on admission - Occult negative on admission - EGD 6/28 showed gastritis / esophagitis - GI signed off 6/29, recommended to call back when pt more stable and willing to undergo colonoscopy  - Continue Protonix 40 mg BID - HG is 7.7 today., will monitor TF as needed   Ascites - SAAG > 1.1 - Negative SBP - S/P paracentesis   Hypokalemia - repleted    CKD stage 3 - nephrology started patient on CVVHD 7/6 with good  results  Generalized weakness - In the setting of acute decompensated CHF - PT recommended SNF placement when medically stabilized   Atrial fibrillation with RVR - CHADS vasc score at least 3 - On amiodarone drip, metoprolol 12.5 mg PO BID - Cardiology team following - now on heparin  Cellulitis, right lower extremity / Leukocytosis  - received vanc on admission . urinalysis suggestive of UTI and was started on ceftriaxone 7/6 -blood culture, apparently thought contaminant, but PCR was positive for mrsa. Will recheck blood cultures   Epistaxis - Improving, I suspect this is related to heparin used for CVVHD, subcut heparin discontinued.  Leukocytosis - appreciate hem/onc consult, likely this is stress reaction, will continue to observe, check CBC this morning  Hypothyroidism - TSH 11.7 - Started levothyroxine, recommending to recheck TSH in 6-8 weeks  Severe protein calorie malnutrition  - on tube feeding   Stage 2 pressure skin injury to bilateral buttocks and right lower extremity  - Seen by wound care, appreciate their assessment - Bilateral buttocks 18 cm x 30 cm with partial thickness tissue loss - Right lateral and posterior foot with 15 cm x 15 cm area of maceration with serous exudate - Added bariatric mattress, moisture barrier ointment   DVT prophylaxis: heparin Code Status: full code  Family Communication: no family at the bedside during rounds Disposition Plan: TBD but likely will need SNF   Consultants:   Cardiology, HF team  GI, [facility]   Pulmonary / CCM   Nutrition  PT - SNF recommended   [place]  Nephrology  Procedures:   Paracentesis 6/26 - 2.5 L fluid  removed   B/ LE venous doppler 6/25 - no DVT  EGD 6/28 - distal esophagitis and gastritis   Antimicrobials:   Vanco 6/24 --> 6/28  Rocephin 6/24 --> 7/2, 7/4-->  Cephalexin 7/2 -->7/4  HPI/Subjective: Reports  Some epistaxis. Still significant fluid overload. receiving CRRT.  D/w nephrology, cardiology   Objective: Vitals:   03/06/17 0800 03/06/17 0805  BP: 97/66   Pulse: (!) 109   Resp: 20   Temp:  98 F (36.7 C)    Intake/Output Summary (Last 24 hours) at 03/06/17 0815 Last data filed at 03/06/17 0800  Gross per 24 hour  Intake          2205.57 ml  Output             4320 ml  Net         -2114.43 ml   Filed Weights   03/04/17 0500 03/05/17 0500 03/06/17 0500  Weight: 107.2 kg (236 lb 5.3 oz) 101.7 kg (224 lb 3.3 oz) 102.4 kg (225 lb 12 oz)    Exam:   General:  Looks chronically ill   Cardiovascular: s1,s2 s3  Respiratory: crackles LL  Abdomen: soft, mild distended, NT  Musculoskeletal: leg edema    Data Reviewed: Basic Metabolic Panel:  Recent Labs Lab 03/03/17 0407  03/04/17 0417 03/04/17 1842 03/05/17 0347 03/05/17 1600 03/06/17 0310  NA 137  138  < > 137 136 137 133* 136  K 3.5  3.5  < > 3.7 3.7 3.8 3.7 3.9  CL 104  105  < > 103 103 104 99* 103  CO2 26  26  < > 26 26 27 27 27   GLUCOSE 99  100*  < > 104* 119* 113* 152* 140*  BUN 18  18  < > 12 10 9 8 8   CREATININE 1.08*  1.08*  < > 1.10* 1.06* 1.09* 1.08* 1.08*  CALCIUM 8.4*  8.5*  < > 8.6* 8.4* 8.5* 8.3* 8.3*  MG 2.3  --  2.4  --  2.3 2.4 2.3  PHOS 1.9*  < > 1.4* 1.1* 1.5* 2.3*  2.3* 1.7*  1.7*  < > = values in this interval not displayed. Liver Function Tests:  Recent Labs Lab 03/04/17 0417 03/04/17 1842 03/05/17 0347 03/05/17 1600 03/06/17 0310  ALBUMIN 2.4* 2.5* 2.4* 2.1* 2.3*   No results for input(s): LIPASE, AMYLASE in the last 168 hours. No results for input(s): AMMONIA in the last 168 hours. CBC:  Recent Labs Lab 02/28/17 0352 03/01/17 0331 03/02/17 0424 03/03/17 0407 03/04/17 0417 03/05/17 0730 03/06/17 0310  WBC 28.5* 27.8* 21.4* 21.5* 22.9* 18.8* 19.8*  NEUTROABS 25.9* 25.3* 18.8* 18.7* 19.3*  --   --   HGB 8.1* 8.3* 8.4* 8.1* 8.4* 8.1* 7.7*  HCT 27.1* 27.8* 28.1* 27.6* 28.5* 27.9* 26.0*  MCV 76.3* 76.0* 77.6* 77.7* 78.5 80.2  78.1  PLT 337 336 298 308 272 275 263   Cardiac Enzymes: No results for input(s): CKTOTAL, CKMB, CKMBINDEX, TROPONINI in the last 168 hours. BNP (last 3 results)  Recent Labs  02/16/17 1516  BNP 923.6*    ProBNP (last 3 results) No results for input(s): PROBNP in the last 8760 hours.  CBG:  Recent Labs Lab 03/05/17 0738 03/05/17 1624 03/05/17 1956 03/05/17 2350 03/06/17 0347  GLUCAP 96 158* 147* 142* 139*    No results found for this or any previous visit (from the past 240 hour(s)).   Studies: No results found.  Scheduled Meds: . Chlorhexidine Gluconate  Cloth  6 each Topical Daily  . feeding supplement (ENSURE ENLIVE)  237 mL Oral BID BM  . feeding supplement (PRO-STAT SUGAR FREE 64)  60 mL Per Tube BID  . fluticasone  2 spray Each Nare Daily  . insulin aspart  0-15 Units Subcutaneous Q4H  . levothyroxine  50 mcg Intravenous Daily  . multivitamin with minerals  1 tablet Oral Daily  . pantoprazole (PROTONIX) IV  40 mg Intravenous Q12H  . phosphorus  500 mg Oral TID  . senna-docusate  1 tablet Per Tube BID  . sodium chloride flush  10-40 mL Intracatheter Q12H  . thiamine injection  100 mg Intravenous Daily   Continuous Infusions: . sodium chloride    . amiodarone 30 mg/hr (03/06/17 0700)  . DOPamine 7 mcg/kg/min (03/06/17 0600)  . feeding supplement (VITAL 1.5 CAL) 1,000 mL (03/05/17 1309)  . heparin 10,000 units/ 20 mL infusion syringe 1,800 Units/hr (03/06/17 0800)  . heparin    . milrinone 0.125 mcg/kg/min (03/06/17 0615)  . potassium phosphate IVPB (mmol)    . dialysis replacement fluid (prismasate) 500 mL/hr at 03/06/17 0700  . dialysis replacement fluid (prismasate) 300 mL/hr at 03/05/17 1708  . dialysate (PRISMASATE) 1,000 mL/hr at 03/06/17 0254    Principal Problem:   Acute blood loss anemia Active Problems:   GI bleed   Pressure injury of skin   Ascites   Generalized weakness   Symptomatic anemia   Acute on chronic combined systolic and  diastolic CHF (congestive heart failure) (HCC)   Atypical atrial flutter (HCC)   Encounter for central line placement   Cardiogenic shock (HCC)   Severe protein-calorie malnutrition (HCC)   Hypothyroidism   AKI (acute kidney injury) ([place])   Leukocytosis    Time spent: >35 minutes     Kinnie Feil  [facility] Pager [phone]. If 7PM-7AM, please contact night-coverage at [email or web address], password [facility] 03/06/2017, 8:15 AM  LOS: 18 days

## 2017-03-06 NOTE — Progress Notes (Signed)
Pt seen on CVVHD.  UO zero.  SBP mostly 90's and still on milrinone and dopamine.  Metabolically stable except PO4 remains low, will replace IV and start orally thru FT.  Still markedly volume overloaded and discussed the fact that this is a marathon.  Foley out

## 2017-03-06 NOTE — Progress Notes (Signed)
Advanced Heart Failure Rounding Note  PCP:  Primary Cardiologist: Dr Acie Fredrickson   Subjective:    02/26/17 started on milrinone and placed on lasix drip.   Evening of 02/27/17 pts pressures fell requiring more dopamine, became less responsive and UOP worsening. Pt transferred to [facility].   CVVHD started 7/6   Continue on CVVHD.   Continues on milrinone 0.125 mcg + dopamine 7 mcg. Todays CO-OX 78.5%.   Complaining of fatigue. Denies SOB. Poor appetite.   Objective:   Weight Range: 102.4 kg (225 lb 12 oz) Body mass index is 37.57 kg/m.   Vital Signs:   Temp:  [97.3 F (36.3 C)-98.2 F (36.8 C)] 98 F (36.7 C) (07/12 0805) Pulse Rate:  [102-109] 109 (07/12 0800) Resp:  [12-23] 20 (07/12 0800) BP: (83-106)/(55-75) 97/66 (07/12 0800) SpO2:  [92 %-100 %] 97 % (07/12 0800) Weight:  [102.4 kg (225 lb 12 oz)] 102.4 kg (225 lb 12 oz) (07/12 0500) Last BM Date: 03/02/17 (per patient)  Weight change: Filed Weights   03/04/17 0500 03/05/17 0500 03/06/17 0500  Weight: 107.2 kg (236 lb 5.3 oz) 101.7 kg (224 lb 3.3 oz) 102.4 kg (225 lb 12 oz)    Intake/Output:   Intake/Output Summary (Last 24 hours) at 03/06/17 0834 Last data filed at 03/06/17 0800  Gross per 24 hour  Intake          2205.57 ml  Output             4320 ml  Net         -2114.43 ml     Physical Exam   CVP 14  General:  Chronically ill appearing. No resp difficulty HEENT: normal Neck: supple. JVP to jaw. Carotids 2+ bilat; no bruits. No lymphadenopathy or thryomegaly appreciated. Cor: PMI nondisplaced. Regular rate & rhythm. No rubs, gallops or murmurs. Lungs: Decreased Abdomen: obese, soft, nontender, nondistended. No hepatosplenomegaly. No bruits or masses. Good bowel sounds. Extremities: no cyanosis, clubbing, rash, R and LLE 2-3+edema Neuro: alert & orientedx3, cranial nerves grossly intact. moves all 4 extremities w/o difficulty. Affect pleasant Skin: RLE dressing. Buttcok multiple partial thickness wounds.      Telemetry  A flutter 100s. Personally reviewed.     EKG   Previously viewed from 02/24/2017 A Flutter 100 bpm   Labs    CBC  Recent Labs  03/04/17 0417 03/05/17 0730 03/06/17 0310  WBC 22.9* 18.8* 19.8*  NEUTROABS 19.3*  --   --   HGB 8.4* 8.1* 7.7*  HCT 28.5* 27.9* 26.0*  MCV 78.5 80.2 78.1  PLT 272 275 841   Basic Metabolic Panel  Recent Labs  03/05/17 1600 03/06/17 0310  NA 133* 136  K 3.7 3.9  CL 99* 103  CO2 27 27  GLUCOSE 152* 140*  BUN 8 8  CREATININE 1.08* 1.08*  CALCIUM 8.3* 8.3*  MG 2.4 2.3  PHOS 2.3*  2.3* 1.7*  1.7*   Liver Function Tests  Recent Labs  03/05/17 1600 03/06/17 0310  ALBUMIN 2.1* 2.3*     BNP (last 3 results)  Recent Labs  02/16/17 1516  BNP 923.6*       Imaging   Transthoracic Echocardiography Study Conclusions  - Left ventricle: Septal flattening consistant with elevated RV   pressures. Systolic function was moderately to severely reduced.   The estimated ejection fraction was in the range of 30% to 35%.   Diffuse hypokinesis. - Mitral valve: There was mild regurgitation. - Left atrium: The atrium  was mildly dilated. - Right ventricle: The cavity size was moderately dilated. - Right atrium: The atrium was moderately dilated. - Atrial septum: No defect or patent foramen ovale was identified. - Tricuspid valve: There was moderate-severe regurgitation. - Pericardium, extracardiac: A trivial pericardial effusion was   identified. - Impressions: Suspect PA pressure underestimated by TR velocity   due to RV dysfunction. There is moderate RV enlargement with   severe hypokinesis and signs of significant cor pulmonale.  Impressions:  - Suspect PA pressure underestimated by TR velocity due to RV   dysfunction. There is moderate RV enlargement with severe   hypokinesis and signs of significant cor pulmonale.     Medications:     Scheduled Medications: . Chlorhexidine Gluconate Cloth  6 each  Topical Daily  . feeding supplement (ENSURE ENLIVE)  237 mL Oral BID BM  . feeding supplement (PRO-STAT SUGAR FREE 64)  60 mL Per Tube BID  . fluticasone  2 spray Each Nare Daily  . insulin aspart  0-15 Units Subcutaneous Q4H  . levothyroxine  50 mcg Intravenous Daily  . multivitamin with minerals  1 tablet Oral Daily  . pantoprazole (PROTONIX) IV  40 mg Intravenous Q12H  . phosphorus  500 mg Oral TID  . senna-docusate  1 tablet Per Tube BID  . sodium chloride flush  10-40 mL Intracatheter Q12H  . thiamine injection  100 mg Intravenous Daily    Infusions: . sodium chloride    . amiodarone 30 mg/hr (03/06/17 0700)  . DOPamine 7 mcg/kg/min (03/06/17 0600)  . feeding supplement (VITAL 1.5 CAL) 1,000 mL (03/05/17 1309)  . heparin 10,000 units/ 20 mL infusion syringe 1,800 Units/hr (03/06/17 0800)  . heparin    . milrinone 0.125 mcg/kg/min (03/06/17 0615)  . potassium phosphate IVPB (mmol) 20 mmol (03/06/17 0821)  . dialysis replacement fluid (prismasate) 500 mL/hr at 03/06/17 0700  . dialysis replacement fluid (prismasate) 300 mL/hr at 03/05/17 1708  . dialysate (PRISMASATE) 1,000 mL/hr at 03/06/17 0614    PRN Medications: sodium chloride, acetaminophen, alum & mag hydroxide-simeth, camphor-menthol, guaiFENesin-dextromethorphan, heparin, heparin, heparin, hydrOXYzine, iopamidol, lip balm, menthol-cetylpyridinium, ondansetron (ZOFRAN) IV, phenol, promethazine, sodium chloride flush    Patient Profile  56 y/o woman with morbid obesity and little previous medical follow-up. Admitted 6/24 with severe anemia (hgb 3.7) and cardiogenic shock due biventricular HF R>L. Echo reviewed EF 30-35% with severe RV Failure and PAH. Has anasarca and progressive renal failure with inability to mobilize fluid. Transferred to [facility] on 7/4 for further evaluation by CHF team.    Assessment/Plan  1. Acute severe biventricular systolic HF (R>L) - Echo 3/71/69 EF 30% with severe PAH and RV failure with  D-shaped septum - Etiology of HF unclear DDx includes: tachy induced vs amyloid vs OHS vs hypothyroidism/myxedema. Low volts on ECG concerning for amyloid. Creatinine too high for CMRI. Consider TPY scan when more stable.  - Todays CO-OX 79%. Continue dopamine 7 mcg and milrinone 0.125 mcg.  CVP 14-15 .  Continue CVVHD. Pulling 120. Weight up 1 pound.    - No beta blocker with acute decompensation.  - Eventually will need VQ and R heart cath and sleep study  2. AKI  - Oliguric. Remains on CVVHD.  - Appreciate Renal input  3. Iron deficiency anemia - Hgb on admit was 3.7. With low MCV. Iron stores low. - EGD 02/20/17 with severe esophagitis  - Received feraheme 02/26/2017.  -Hgb down to 7.7. CBC in am. Blood per primary team - Will  need colonoscopy when more stable.   4. Atrial flutter - Rate just mildly elevated in setting of dopamine. Continue amio. Complaining of nausea ? Amio.  - On heparin through CVVHD.  - Will eventually need  DC-CV and possible ablation  5. Anasarca/ascites in setting of severe protein calorie malnutition - CT abdomen without hepatic stenosis.  - Likely due to combination of RHF and fatty liver. Albumin 2.7 02/18/17 - prealbumin was < 5 on 02/18/17. - Recheck Prealbumin. Started Tube feeds.   7. Leukocytosis - Persistently elevated Rocephin for UTI  -RLE wound does not appear infected. Buttock wounds appear partial thickness with evidence of induration.  PCT only 1.44 02/27/17.  - Seen by Dr. Beryle Beams on 7/9. Smear ok. No evidence of toxic process or malignancy at this point. WBC up 19.8  8. Severe fibroid uterus  9. Morbid obesity - Nutrition following .    10. RLE wound Continue foam dressing on RLE wound.  - No change to current plan.   11. Probable OSA/OHS - Bipap as needed.   12-MASD (Moisture Associated Skin Damage) Buttock Wounds- Partial thickness- dose appear infected. Appears to be related to moisture and fecal incontinence.  Will  likely need barrier cream as urine leaks under dressing. Prealbumin <5.  Wound Care appreciated.     Length of Stay: [place], NP  03/06/2017, 8:34 AM  Advanced Heart Failure Team Pager [phone] (M-F; 7a - 4p)  Please contact [facility] for night-coverage after hours (4p -7a ) and weekends on [email or web address]  Agree.   Remains critically ill. Continues on dopamine and milrinone. TFs started yesterday. Very weak and getting weaker.   Still markedly volume overloaded and remains on CVVHD. Anuric. WBC remains elevated. Hgb down. Will likely need blood later today.   On exam very weak Cor trak tube present Trialysis cath RIJ Cor RRR Lungs decreased anteriorly Ab obese NT Extremities severe edema  Remains critically ill and making very little progress. Continues on vasopressors. Pulling fluid with CVVHD. Hopefully TFs will help.  CRITICAL CARE Performed by: Glori Bickers  Total critical care time: 40 minutes  Critical care time was exclusive of separately billable procedures and treating other patients.  Critical care was necessary to treat or prevent imminent or life-threatening deterioration.  Critical care was time spent personally by me (independent of midlevel providers or residents) on the following activities: development of treatment plan with patient and/or surrogate as well as nursing, discussions with consultants, evaluation of patient's response to treatment, examination of patient, obtaining history from patient or surrogate, ordering and performing treatments and interventions, ordering and review of laboratory studies, ordering and review of radiographic studies, pulse oximetry and re-evaluation of patient's condition.     Glori Bickers, MD  9:24 PM

## 2017-03-07 ENCOUNTER — Inpatient Hospital Stay (HOSPITAL_COMMUNITY): Payer: Self-pay

## 2017-03-07 DIAGNOSIS — R601 Generalized edema: Secondary | ICD-10-CM

## 2017-03-07 LAB — POCT ACTIVATED CLOTTING TIME
ACTIVATED CLOTTING TIME: 180 s
ACTIVATED CLOTTING TIME: 186 s
ACTIVATED CLOTTING TIME: 191 s
ACTIVATED CLOTTING TIME: 197 s
ACTIVATED CLOTTING TIME: 197 s
ACTIVATED CLOTTING TIME: 202 s
ACTIVATED CLOTTING TIME: 208 s
ACTIVATED CLOTTING TIME: 208 s
ACTIVATED CLOTTING TIME: 208 s
ACTIVATED CLOTTING TIME: 219 s

## 2017-03-07 LAB — MAGNESIUM: Magnesium: 2.3 mg/dL (ref 1.7–2.4)

## 2017-03-07 LAB — CBC
HEMATOCRIT: 26.9 % — AB (ref 36.0–46.0)
Hemoglobin: 8 g/dL — ABNORMAL LOW (ref 12.0–15.0)
MCH: 23.7 pg — ABNORMAL LOW (ref 26.0–34.0)
MCHC: 29.7 g/dL — ABNORMAL LOW (ref 30.0–36.0)
MCV: 79.6 fL (ref 78.0–100.0)
Platelets: 226 10*3/uL (ref 150–400)
RBC: 3.38 MIL/uL — AB (ref 3.87–5.11)
RDW: 33.7 % — AB (ref 11.5–15.5)
WBC: 21.3 10*3/uL — AB (ref 4.0–10.5)

## 2017-03-07 LAB — BLOOD GAS, ARTERIAL
Acid-Base Excess: 4.3 mmol/L — ABNORMAL HIGH (ref 0.0–2.0)
Bicarbonate: 28 mmol/L (ref 20.0–28.0)
DRAWN BY: 100061
FIO2: 21
O2 Saturation: 92.6 %
PATIENT TEMPERATURE: 98.6
pCO2 arterial: 39.7 mmHg (ref 32.0–48.0)
pH, Arterial: 7.463 — ABNORMAL HIGH (ref 7.350–7.450)
pO2, Arterial: 61.9 mmHg — ABNORMAL LOW (ref 83.0–108.0)

## 2017-03-07 LAB — RENAL FUNCTION PANEL
ANION GAP: 7 (ref 5–15)
Albumin: 2.3 g/dL — ABNORMAL LOW (ref 3.5–5.0)
Albumin: 2.2 g/dL — ABNORMAL LOW (ref 3.5–5.0)
Anion gap: 6 (ref 5–15)
BUN: 12 mg/dL (ref 6–20)
BUN: 13 mg/dL (ref 6–20)
CALCIUM: 8.2 mg/dL — AB (ref 8.9–10.3)
CHLORIDE: 103 mmol/L (ref 101–111)
CHLORIDE: 103 mmol/L (ref 101–111)
CO2: 27 mmol/L (ref 22–32)
CO2: 27 mmol/L (ref 22–32)
CREATININE: 1.12 mg/dL — AB (ref 0.44–1.00)
Calcium: 8.2 mg/dL — ABNORMAL LOW (ref 8.9–10.3)
Creatinine, Ser: 1.07 mg/dL — ABNORMAL HIGH (ref 0.44–1.00)
GFR calc Af Amer: >60 mL/min (ref >60)
GFR calc non Af Amer: 58 mL/min — ABNORMAL LOW (ref >60)
GFR, EST NON AFRICAN AMERICAN: 55 mL/min — AB (ref >60)
GLUCOSE: 139 mg/dL — AB (ref 65–99)
Glucose, Bld: 86 mg/dL (ref 65–99)
POTASSIUM: 3.6 mmol/L (ref 3.5–5.1)
Phosphorus: 2.1 mg/dL — ABNORMAL LOW (ref 2.5–4.6)
Phosphorus: 2.4 mg/dL — ABNORMAL LOW (ref 2.5–4.6)
Potassium: 3.6 mmol/L (ref 3.5–5.1)
SODIUM: 136 mmol/L (ref 135–145)
Sodium: 137 mmol/L (ref 135–145)

## 2017-03-07 LAB — COOXEMETRY PANEL
Carboxyhemoglobin: 1.8 % — ABNORMAL HIGH (ref 0.5–1.5)
Methemoglobin: 1.2 % (ref 0.0–1.5)
O2 SAT: 67.1 %
Total hemoglobin: 7.9 g/dL — ABNORMAL LOW (ref 12.0–16.0)

## 2017-03-07 LAB — GLUCOSE, CAPILLARY
Glucose-Capillary: 125 mg/dL — ABNORMAL HIGH (ref 65–99)
Glucose-Capillary: 113 mg/dL — ABNORMAL HIGH (ref 65–99)
Glucose-Capillary: 123 mg/dL — ABNORMAL HIGH (ref 65–99)
Glucose-Capillary: 81 mg/dL (ref 65–99)
Glucose-Capillary: 106 mg/dL — ABNORMAL HIGH (ref 65–99)

## 2017-03-07 LAB — APTT

## 2017-03-07 LAB — PREALBUMIN: PREALBUMIN: 5.2 mg/dL — AB (ref 18–38)

## 2017-03-07 MED ORDER — POTASSIUM PHOSPHATES 15 MMOLE/5ML IV SOLN
10.0000 mmol | Freq: Once | INTRAVENOUS | Status: AC
  Administered 2017-03-07: 10 mmol via INTRAVENOUS
  Filled 2017-03-07: qty 3.33

## 2017-03-07 MED ORDER — ADULT MULTIVITAMIN W/MINERALS CH
1.0000 | ORAL_TABLET | Freq: Every day | ORAL | Status: DC
  Administered 2017-03-08: 1 via ORAL
  Filled 2017-03-07: qty 1

## 2017-03-07 MED ORDER — ACETAMINOPHEN 160 MG/5ML PO SOLN: 650.0000 mg | Freq: Four times a day (QID) | ORAL | Status: DC | PRN

## 2017-03-07 MED ORDER — POTASSIUM CHLORIDE 20 MEQ PO PACK
20.0000 meq | PACK | Freq: Two times a day (BID) | ORAL | Status: DC
  Administered 2017-03-07: 20 meq via NASOGASTRIC
  Filled 2017-03-07: qty 1

## 2017-03-07 MED ORDER — POTASSIUM CHLORIDE 20 MEQ PO PACK
20.0000 meq | PACK | Freq: Two times a day (BID) | ORAL | Status: DC
  Administered 2017-03-07 – 2017-03-08 (×2): 20 meq via ORAL
  Filled 2017-03-07 (×6): qty 1

## 2017-03-07 MED ORDER — SENNOSIDES-DOCUSATE SODIUM 8.6-50 MG PO TABS
1.0000 | ORAL_TABLET | Freq: Two times a day (BID) | ORAL | Status: DC
  Administered 2017-03-07 – 2017-03-08 (×2): 1 via ORAL
  Filled 2017-03-07 (×3): qty 1

## 2017-03-07 MED ORDER — LEVOTHYROXINE SODIUM 100 MCG PO TABS
100.0000 ug | ORAL_TABLET | Freq: Every day | ORAL | Status: DC
  Administered 2017-03-08: 100 ug via ORAL
  Filled 2017-03-07: qty 1

## 2017-03-07 MED ORDER — PANTOPRAZOLE SODIUM 40 MG PO TBEC
40.0000 mg | DELAYED_RELEASE_TABLET | Freq: Two times a day (BID) | ORAL | Status: DC
  Administered 2017-03-07 – 2017-03-08 (×2): 40 mg via ORAL
  Filled 2017-03-07 (×2): qty 1

## 2017-03-07 NOTE — Consult Note (Addendum)
[facility] Nurse wound re-consult note Refer to previous [facility] consult from 6/26.  Requested to re-assess skin to buttocks.  Bedside nurse reports that it had declined earlier this week with patchy areas of slough. Upon assessment today, the nurse states all areas have greatly improved since that time and there is no further slough; pt has been more receptive to turning and repositioning off the affected areas and a Flexiseal is in place to attempt to contain loose stool. Reason for Consult: Patient had fallen prior to admission and was not able to turn and reposition or get up for several days.  She was incontinent of urine and feces, skin was severely denuded on the posterior thighs, medial thighs, bilateral buttocks and right lateral and posterior foot. Patient is on a mattress replacement with low air loss feature.  Wound type: IAD  Pressure Injury POA: Several stage 2 pressure injuries were noted as present on admission on the nursing flow sheet.  Pt has developed patchy areas of full thickness skin loss to bilat buttocks from previous moisture associated skin damage. Other areas in this location and posterior thighs and legs have lighter-colored skin where previous wounds have healed. Measurement: Left buttock 3X3X.2cm, red and moist, small amt tan drainage, no odor Left buttock .5X.5X.2cm, red and moist, small amt tan drainage, no odor Right buttock .5X.5X.2cm, red and moist, small amt tan drainage, no odor Right buttock.3X.3X.1cm, red and moist, small amt tan drainage, no odor Dressing procedure/placement/frequency:  Continue present plan of care with foam dressings to protect affected areas and promote healing. Discussed plan of care with patient and she verbalized understanding. Please re-consult if further assistance is needed.  Thank-you,  Julien Girt MSN, [place], [place], [place], [place]

## 2017-03-07 NOTE — Progress Notes (Signed)
PT Cancellation Note  Patient Details Name: Samantha Richards MRN: [id number] DOB: 24-Jun-1961   Cancelled Treatment:    Reason Eval/Treat Not Completed: Patient declined, no reason specified;Other (comment) Pt dry heaving and feels sick on her stomach. Declines mobility at this time. Will follow up as time allows.   Marguarite Arbour A Lindy Garczynski 03/07/2017, 10:58 AM Wray Kearns, PT, DPT [phone]

## 2017-03-07 NOTE — Progress Notes (Signed)
CSW continue to follow for possible placement needs when pt is medically stable  Jorge Ny, [place] Social Worker [phone]

## 2017-03-07 NOTE — Progress Notes (Signed)
MD paged, pt request to remove Cortrak d/t making her have n/v, pt educated on need for Cortrak for nutrition but pt continues to request it be removed and states she will take nutrition PO instead, MD okay with removal of tube, will continue to monitor.   Kathleen Argue S 11:07 AM

## 2017-03-07 NOTE — Progress Notes (Signed)
PULMONARY / CRITICAL CARE MEDICINE   Name: Samantha Richards MRN: [id number] DOB: [birth year]    ADMISSION DATE:  02/16/2017 CONSULTATION DATE:  02/27/17  REFERRING MD:  Dr. Wynetta Emery  CHIEF COMPLAINT:  Hypotension  BRIEF SUMMARY:   Samantha Richards is a 56 y.o. female who presented to [facility] on 6/24 after a fall at home and was found to be in hemorrhagic/hypovolemic and cardiogenic shock. She was severely anemic to Hgb 3.7 in the setting of GI bleed. Requiring transfusions, pressors and BiPAP in ICU. She was also treated for cellulitis of the back. Levophed was weaned 6/28 and the patient was transferred to hospitalist service 6/29. Cardiology has been managing AFib with RVR with amiodarone gtt and biventricular heart failure R>L with lasix gtt. She had cardiogenic shock prompting transfer to [facility] on 7/4 for further evaluation by CHF team and initiation of milrinone and dopamine gtt. She also has anasarca and progressive renal failure with oliguria and hypotension.  Nephrology consulted, CVVHD initiated 7/6.    SUBJECTIVE:  No events overnight, continues to be on milrinone and dopamine drips  VITAL SIGNS: BP 95/60 (BP Location: Left Arm)   Pulse (!) 106   Temp (!) 97.4 F (36.3 C) (Oral)   Resp (!) 21   Ht 5\' 5"  (1.651 m)   Wt 94 kg (207 lb 3.7 oz)   LMP 01/16/2017 Comment: neg preg 02-16-2017  SpO2 97%   BMI 34.49 kg/m   HEMODYNAMICS: CVP:  [14 mmHg-16 mmHg] 16 mmHg  VENTILATOR SETTINGS:    INTAKE / OUTPUT: I/O last 3 completed shifts: In: 3690.4 [I.V.:1264.4; NG/GT:1960; IV [id number]:893] Out: 8101 [Other:9883]  PHYSICAL EXAMINATION: General: Chronically ill appearing, NAD HEENT: [place]/AT, PERRL, EOM-I and MMM PSY: Calm and appropriate Neuro: Alert and oriented x3, moving ext to command CV: RRR, Nl S1/S2, -M/R/G. PULM: Bibasilar crackles. GI: Soft, NT, ND and +BS, NGT in place Extremities: warm/dry, anasarca  Skin: no rashes or lesions  LABS:  BMET  Recent Labs Lab  03/06/17 0310 03/06/17 1637 03/07/17 0427  NA 136 136 137  K 3.9 4.0 3.6  CL 103 102 103  CO2 27 28 27   BUN 8 8 12   CREATININE 1.08* 1.16* 1.07*  GLUCOSE 140* 140* 139*   Electrolytes  Recent Labs Lab 03/06/17 0310 03/06/17 1637 03/07/17 0427  CALCIUM 8.3* 8.5* 8.2*  MG 2.3 2.3 2.3  PHOS 1.7*  1.7* 4.2  2.2* 2.1*   CBC  Recent Labs Lab 03/05/17 0730 03/06/17 0310 03/07/17 0427  WBC 18.8* 19.8* 21.3*  HGB 8.1* 7.7* 8.0*  HCT 27.9* 26.0* 26.9*  PLT 275 263 226   Coag's  Recent Labs Lab 03/05/17 0347 03/06/17 0310 03/07/17 0427  APTT >200* >200* >200*   Sepsis Markers  Recent Labs Lab 03/01/17 0331 03/03/17 0407  PROCALCITON 1.30 0.40   ABG  Recent Labs Lab 03/07/17 0500  PHART 7.463*  PCO2ART 39.7  PO2ART 61.9*   Liver Enzymes  Recent Labs Lab 03/06/17 0310 03/06/17 1637 03/07/17 0427  ALBUMIN 2.3* 2.4* 2.3*   Cardiac Enzymes No results for input(s): TROPONINI, PROBNP in the last 168 hours.  Glucose  Recent Labs Lab 03/06/17 1315 03/06/17 1604 03/06/17 2001 03/06/17 2350 03/07/17 0354 03/07/17 0736  GLUCAP 137* 130* 137* 103* 125* 113*   Imaging Dg Chest Port 1 View  Result Date: 03/07/2017 CLINICAL DATA:  CHF with weakness EXAM: PORTABLE CHEST 1 VIEW COMPARISON:  Three days ago FINDINGS: Stable cardiopericardial enlargement. Linear opacities in the perihilar and retrocardiac  lungs attributed atelectasis. No Kerley lines for edema. No effusion or pneumothorax. Feeding tube has been placed, at least reaching the stomach. Right IJ central line with tip at the SVC. Left IJ central line cannulating the azygos. IMPRESSION: 1. Cardiomegaly without failure. 2. Low volume chest with atelectasis. Electronically Signed   By: Monte Fantasia M.D.   On: 03/07/2017 08:42   STUDIES:  XR right Knee 6/24 >> No Acute CT abd/pelvis 6/25 >> anasarca with b/l pleural effusions, ascities. Fibroid uterus. Cardiomegaly with severe R atrial  dilation. TTE 6/25 >> EF30-35%, diffuse hypokinesis, LA dilated, mild MR, RA mod dilated with signs of significant cor pulmonale. LE venous doppler 6/25 >> no DVT or SVT b/l US Paracentesis 6/26 >> 2.5 L peritoneal fluid yielded, cytology negative EGD 6/28 >> intense distal esophagitis with focal hemorrhagic hemorrhage, small hiatal hernia CXR 7/5 >> Cardiomegaly with vascular congestion, R midlung atelectasis with LLL opacity.  CULTURES: MRSA PCR neg 6/24 Blood cultures 6/24 > 1 of 2 coag neg staph, likely contaminate Peritoneal fluid culture 6/26 > No growth 5 days, neg for AFB HIV 6/29 > negative  ANTIBIOTICS: Vancomycin 6/24 > 6/28 Rocephin 6/25 > 7/2 Keflex 7/2 > 7/4 Rocephin 7/6 >>7/12  SIGNIFICANT EVENTS: 6/24  Presents to [facility], admitted to ICU  6/29  Off vasopressors, transferred out to SDU 7/02  Lasix gtt initiated 7/04  Transferred to [facility]. Milrinone started 7/05  Dopamine started, transfer to ICU 7/06  Diffuse pain, requires pressor support with dopamine and a lasix drip.  Nausea.  CVVHD initiated.  7/07  Removed 2.3L in last 24 hours, remains positive balance.    LINES/TUBES: L IJ CVL 6/26 >> Foley 6/24 >> R IJ HD 7/6 >>   DISCUSSION: 56 y.o. female who presented after a fall at home found to be in hemorrhagic/hypovolemic with severe anemia and cardiogenic shock due to biventricular heart failure. Now with worsening anasarca and progressive renal failure with oliguria and hypotension.  CVVHD initiated 7/6.    ASSESSMENT / PLAN:  PULMONARY A: Suspected OHS/OSA - intolerant of CPAP while inpatient (may have been related to fluid status) P:   Monitor in ICU given hypotension and anasarca given pressor need Wean O2 for sats  Pulmonary hygiene - IS, mobilize  CARDIOVASCULAR A:  Acute systolic heart failure with RV overload/cor pulmonale  Afib with RVR, improved Cardiogenic shock  P:  Cardiology/CHF following  CRRT per renal Tele monitoring CVP qshift MAP  goal >65  Amiodarone, Dopamine, Milrinone per Cardiology  RENAL A:   Acute Kidney Injury Oliguria P:  CVVHD per Nephrology  Trend BMP/urinary output Replace electrolytes as indicated Avoid nephrotoxic agents as able, ensure adequate renal perfusion  GASTROINTESTINAL A:   Upper GI bleed on EGD Severe protein-calorie malnutrition with kwashiokor Hepatic steatosis Ascites > cytology negative Peritoneal mets vs uterine fibroids P:   BID PPI  Nutrition following, promote adequate nutrition   HEMATOLOGIC A:   Anemia, likely multifactorial from GI bleed and chronic malnutrition P:  Trend CBC Transfuse for Hgb <7 Continue iron supplement Peripheral smear with elliptocytes, polychromasia SCD's  INFECTIOUS A:   Skin breakdown/wounds Cellulitis RLE Stage II Decubitus to Bilateral Buttock, RLE UTI  P:   Would care for BLE Monitor skin breakdown  D/C abx  ENDOCRINE A:   Mild hyperglycemia Hypothyroid P:   Continue synthroid  Will need follow up TSH in 6 weeks > around 8/1  SSI CBGs  NEUROLOGIC A:   Severe Deconditioning  P:  PT efforts as patient is able  FAMILY  - Updates: Patient updated bedside   The patient is critically ill with multiple organ systems failure and requires high complexity decision making for assessment and support, frequent evaluation and titration of therapies, application of advanced monitoring technologies and extensive interpretation of multiple databases.   Critical Care Time devoted to patient care services described in this note is  35  Minutes. This time reflects time of care of this signee Dr Jennet Maduro. This critical care time does not reflect procedure time, or teaching time or supervisory time of PA/NP/Med student/Med Resident etc but could involve care discussion time.  Rush Farmer, M.D. [facility] Pulmonary/Critical Care Medicine. Pager: [phone]. After hours pager: [phone].  03/07/2017, 8:53 AM

## 2017-03-07 NOTE — Progress Notes (Signed)
CVVHD management.  SBP upper 90's on dopamine and milrinone.  PO4 better but still sl low.  Will give another dose of IV PO4, also getting PO4 per FT.  Now pulling 175cc/hr.  K 3.6, will give some K orally.

## 2017-03-07 NOTE — Progress Notes (Signed)
Advanced Heart Failure Rounding Note  PCP:  Primary Cardiologist: Dr Acie Fredrickson   Subjective:    02/26/17 started on milrinone and placed on lasix drip.   Evening of 02/27/17 pts pressures fell requiring more dopamine, became less responsive and UOP worsening. Pt transferred to [place].   CVVHD started 7/6   Continue on CVVHD. Continues on milrinone 0.125 mcg + dopamine 7 mcg. Todays CO-OX is 67%. Weight down another 18 pounds. WOC saw today. Buttock wounds improving.      Objective:   Weight Range: 207 lb 3.7 oz (94 kg) Body mass index is 34.49 kg/m.   Vital Signs:   Temp:  [97.4 F (36.3 C)-97.7 F (36.5 C)] 97.4 F (36.3 C) (07/13 0739) Pulse Rate:  [104-112] 106 (07/13 0800) Resp:  [14-26] 21 (07/13 0800) BP: (87-104)/(51-77) 95/60 (07/13 0800) SpO2:  [91 %-100 %] 97 % (07/13 0800) Weight:  [207 lb 3.7 oz (94 kg)] 207 lb 3.7 oz (94 kg) (07/13 0700) Last BM Date: 03/06/17  Weight change: Filed Weights   03/05/17 0500 03/06/17 0500 03/07/17 0700  Weight: 224 lb 3.3 oz (101.7 kg) 225 lb 12 oz (102.4 kg) 207 lb 3.7 oz (94 kg)    Intake/Output:   Intake/Output Summary (Last 24 hours) at 03/07/17 0825 Last data filed at 03/07/17 0800  Gross per 24 hour  Intake          2626.64 ml  Output             7671 ml  Net         -5044.36 ml     Physical Exam   CVP 16 General:  Chronically ill  appearing. No resp difficulty HEENT: normal Neck: supple. no JVD. Carotids 2+ bilat; no bruits. No lymphadenopathy or thryomegaly appreciated. HD catheter RIJ, LIJ  Cor: PMI nondisplaced. Regular rate & rhythm. No rubs, gallops or murmurs. Lungs: clear Abdomen: soft, nontender, nondistended. No hepatosplenomegaly. No bruits or masses. Good bowel sounds. Extremities: no cyanosis, clubbing, rash, R and LLE 3+ edema Neuro: alert & orientedx3, cranial nerves grossly intact. moves all 4 extremities w/o difficulty. Affect pleasant Skin: Partial thickness wounds on buttocks.       Telemetry  A flutter 100s. Personally reviewed.    EKG   Previously viewed from 02/24/2017 A Flutter 100 bpm   Labs    CBC  Recent Labs  03/06/17 0310 03/07/17 0427  WBC 19.8* 21.3*  HGB 7.7* 8.0*  HCT 26.0* 26.9*  MCV 78.1 79.6  PLT 263 540   Basic Metabolic Panel  Recent Labs  03/06/17 1637 03/07/17 0427  NA 136 137  K 4.0 3.6  CL 102 103  CO2 28 27  GLUCOSE 140* 139*  BUN 8 12  CREATININE 1.16* 1.07*  CALCIUM 8.5* 8.2*  MG 2.3 2.3  PHOS 4.2  2.2* 2.1*   Liver Function Tests  Recent Labs  03/06/17 1637 03/07/17 0427  ALBUMIN 2.4* 2.3*     BNP (last 3 results)  Recent Labs  02/16/17 1516  BNP 923.6*       Imaging   Transthoracic Echocardiography Study Conclusions  - Left ventricle: Septal flattening consistant with elevated RV   pressures. Systolic function was moderately to severely reduced.   The estimated ejection fraction was in the range of 30% to 35%.   Diffuse hypokinesis. - Mitral valve: There was mild regurgitation. - Left atrium: The atrium was mildly dilated. - Right ventricle: The cavity size was moderately dilated. - Right  atrium: The atrium was moderately dilated. - Atrial septum: No defect or patent foramen ovale was identified. - Tricuspid valve: There was moderate-severe regurgitation. - Pericardium, extracardiac: A trivial pericardial effusion was   identified. - Impressions: Suspect PA pressure underestimated by TR velocity   due to RV dysfunction. There is moderate RV enlargement with   severe hypokinesis and signs of significant cor pulmonale.  Impressions:  - Suspect PA pressure underestimated by TR velocity due to RV   dysfunction. There is moderate RV enlargement with severe   hypokinesis and signs of significant cor pulmonale.     Medications:     Scheduled Medications: . Chlorhexidine Gluconate Cloth  6 each Topical Daily  . feeding supplement (ENSURE ENLIVE)  237 mL Oral BID BM  .  feeding supplement (PRO-STAT SUGAR FREE 64)  60 mL Per Tube BID  . fluticasone  2 spray Each Nare Daily  . insulin aspart  0-15 Units Subcutaneous Q4H  . levothyroxine  100 mcg Per Tube Daily  . multivitamin  15 mL Oral Daily  . pantoprazole sodium  40 mg Per Tube BID  . phosphorus  500 mg Oral TID  . potassium chloride  20 mEq Per NG tube BID  . sennosides  5 mL Per Tube BID  . sodium chloride flush  10-40 mL Intracatheter Q12H  . thiamine  100 mg Per Tube Daily    Infusions: . sodium chloride    . amiodarone 30 mg/hr (03/07/17 0800)  . DOPamine 7 mcg/kg/min (03/07/17 0800)  . feeding supplement (VITAL 1.5 CAL) 1,000 mL (03/07/17 0800)  . heparin 10,000 units/ 20 mL infusion syringe 1,800 Units/hr (03/07/17 0800)  . heparin    . milrinone 0.125 mcg/kg/min (03/07/17 0800)  . potassium phosphate IVPB (mmol)    . dialysis replacement fluid (prismasate) 500 mL/hr at 03/07/17 0353  . dialysis replacement fluid (prismasate) 300 mL/hr at 03/07/17 0519  . dialysate (PRISMASATE) 1,000 mL/hr at 03/07/17 0313    PRN Medications: sodium chloride, acetaminophen, alum & mag hydroxide-simeth, camphor-menthol, guaiFENesin-dextromethorphan, heparin, heparin, heparin, hydrOXYzine, iopamidol, lip balm, menthol-cetylpyridinium, ondansetron (ZOFRAN) IV, phenol, promethazine, promethazine, sodium chloride flush    Patient Profile  56 y/o woman with morbid obesity and little previous medical follow-up. Admitted 6/24 with severe anemia (hgb 3.7) and cardiogenic shock due biventricular HF R>L. Echo reviewed EF 30-35% with severe RV Failure and PAH. Has anasarca and progressive renal failure with inability to mobilize fluid. Transferred to [facility] on 7/4 for further evaluation by CHF team.    Assessment/Plan  1. Acute severe biventricular systolic HF (R>L) - Echo 5/85/54 EF 30% with severe PAH and RV failure with D-shaped septum - Etiology of HF unclear DDx includes: tachy induced vs amyloid vs OHS vs  hypothyroidism/myxedema. Low volts on ECG concerning for amyloid. Creatinine too high for CMRI. Consider TPY scan when more stable.  - Todays CO-OX 67%. Continue dopamine 7 mcg + milrinone 0.125 mcg.   CVP 16.  Continue CVVHD. Pulling 175. Weight down another 18 pounds.     - No beta blocker with acute decompensation.  - Eventually will need VQ and R heart cath and sleep study  2. AKI  - Remains on CVVHD.  - Appreciate Renal input  3. Iron deficiency anemia - Hgb on admit was 3.7. With low MCV. Iron stores low. - EGD 02/20/17 with severe esophagitis  - Received feraheme 02/26/2017.  -Hgb 8. Has had epistaxis. CBC in am.  - Will need colonoscopy when more stable.  4. Atrial flutter - Rate just mildly elevated in setting of dopamine. Continue amio. Complaining of nausea ? Amio.  - Continue heparin.   - Will eventually need  DC-CV and possible ablation  5. Anasarca/ascites in setting of severe protein calorie malnutition - CT abdomen without hepatic stenosis.  - Likely due to combination of RHF and fatty liver. Albumin 2.7 02/18/17 - prealbumin was < 5 on 02/18/17. - Recheck Prealbumin. Started Tube feeds.   7. Leukocytosis - Persistently elevated Rocephin for UTI  -RLE wound does not appear infected. Buttock wounds appear partial thickness with evidence of induration.  PCT only 1.44 02/27/17.  - Seen by Dr. Beryle Beams on 7/9. Smear ok. No evidence of toxic process or malignancy at this point.  WBC trending up.   8. Severe fibroid uterus  9. Morbid obesity - Nutrition following .    10. RLE wound Continue foam dressing on RLE wound.  - No change to current plan.   11. Probable OSA/OHS - Bipap as needed.   12-MASD (Moisture Associated Skin Damage) Buttock Wounds- Partial thickness- dose not appear infected. Appears to be related to moisture and fecal incontinence.  Prealbumin <5.  Wound Care appreciated.    Length of Stay: [place], NP  03/07/2017, 8:25  AM  Advanced Heart Failure Team Pager [phone] (M-F; [place])  Please contact [facility] for night-coverage after hours (4p -7a ) and weekends on [email or web address]  Agree with above.   She remains critically ill.   Anuric. Remains on CVVHD. CVP remains elevated with massive volume overload despite removal of 53 pounds with CVVHD. Nutritional intake remains very poor. We place Cor-Trak for TFs but she wanted the tube out this am as it made her sick. Remains in AF. BP and co-ox stable on IV dopamine and milrinone.   Exam  Very fatigued and lethargic CVP up. + trialysis cath Cor Reg tachy Lungs clear anteriorly Ab obese soft Extremities cool 3-4+ edema.   She continues to deteriorate. I am very concerned about her prognosis particularly if we are unable to provide her with nutritional support. Will ask Nutrition to see again to consider TPN. WiIl continue inotropic support and CVVHD for now.  CRITICAL CARE Performed by: Glori Bickers  Total critical care time: 35 minutes  Critical care time was exclusive of separately billable procedures and treating other patients.  Critical care was necessary to treat or prevent imminent or life-threatening deterioration.  Critical care was time spent personally by me (independent of midlevel providers or residents) on the following activities: development of treatment plan with patient and/or surrogate as well as nursing, discussions with consultants, evaluation of patient's response to treatment, examination of patient, obtaining history from patient or surrogate, ordering and performing treatments and interventions, ordering and review of laboratory studies, ordering and review of radiographic studies, pulse oximetry and re-evaluation of patient's condition.   Glori Bickers, MD  4:01 PM

## 2017-03-07 NOTE — Progress Notes (Signed)
MEDICATION RELATED CONSULT NOTE - INITIAL   Pharmacy Consult for TPN Indication: intolerance to enteral feeding   Pharmacy received orders to start nutrition support after hours. Will order labs for tomorrow and do full nutritional assessment in am.  Plan Bmet, phos, mg in am TPN orders as appropriate  Erin Hearing PharmD., BCPS Clinical Pharmacist Pager [phone] 03/07/2017 4:10 PM

## 2017-03-08 ENCOUNTER — Inpatient Hospital Stay (HOSPITAL_COMMUNITY): Payer: Self-pay

## 2017-03-08 DIAGNOSIS — R51 Headache: Secondary | ICD-10-CM

## 2017-03-08 LAB — BASIC METABOLIC PANEL
Anion gap: 7 (ref 5–15)
BUN: 14 mg/dL (ref 6–20)
CALCIUM: 8.6 mg/dL — AB (ref 8.9–10.3)
CHLORIDE: 101 mmol/L (ref 101–111)
CO2: 27 mmol/L (ref 22–32)
CREATININE: 1.11 mg/dL — AB (ref 0.44–1.00)
GFR, EST NON AFRICAN AMERICAN: 56 mL/min — AB (ref >60)
Glucose, Bld: 103 mg/dL — ABNORMAL HIGH (ref 65–99)
Potassium: 4.3 mmol/L (ref 3.5–5.1)
SODIUM: 135 mmol/L (ref 135–145)

## 2017-03-08 LAB — RENAL FUNCTION PANEL
ALBUMIN: 2.2 g/dL — AB (ref 3.5–5.0)
Albumin: 2.4 g/dL — ABNORMAL LOW (ref 3.5–5.0)
Anion gap: 6 (ref 5–15)
Anion gap: 7 (ref 5–15)
BUN: 14 mg/dL (ref 6–20)
BUN: 13 mg/dL (ref 6–20)
CALCIUM: 8.6 mg/dL — AB (ref 8.9–10.3)
CO2: 28 mmol/L (ref 22–32)
CO2: 27 mmol/L (ref 22–32)
CREATININE: 1.1 mg/dL — AB (ref 0.44–1.00)
Calcium: 9 mg/dL (ref 8.9–10.3)
Chloride: 102 mmol/L (ref 101–111)
Chloride: 102 mmol/L (ref 101–111)
Creatinine, Ser: 1.24 mg/dL — ABNORMAL HIGH (ref 0.44–1.00)
GFR calc Af Amer: >60 mL/min (ref >60)
GFR calc Af Amer: 56 mL/min — ABNORMAL LOW (ref >60)
GFR, EST NON AFRICAN AMERICAN: 56 mL/min — AB (ref >60)
GFR, EST NON AFRICAN AMERICAN: 49 mL/min — AB (ref >60)
GLUCOSE: 107 mg/dL — AB (ref 65–99)
Glucose, Bld: 120 mg/dL — ABNORMAL HIGH (ref 65–99)
PHOSPHORUS: 2.8 mg/dL (ref 2.5–4.6)
POTASSIUM: 4.2 mmol/L (ref 3.5–5.1)
Phosphorus: 2.8 mg/dL (ref 2.5–4.6)
Potassium: 4.3 mmol/L (ref 3.5–5.1)
SODIUM: 136 mmol/L (ref 135–145)
Sodium: 136 mmol/L (ref 135–145)

## 2017-03-08 LAB — GLUCOSE, CAPILLARY
GLUCOSE-CAPILLARY: 112 mg/dL — AB (ref 65–99)
GLUCOSE-CAPILLARY: 128 mg/dL — AB (ref 65–99)
GLUCOSE-CAPILLARY: 96 mg/dL (ref 65–99)
Glucose-Capillary: 107 mg/dL — ABNORMAL HIGH (ref 65–99)

## 2017-03-08 LAB — POCT ACTIVATED CLOTTING TIME
ACTIVATED CLOTTING TIME: 230 s
ACTIVATED CLOTTING TIME: 219 s
ACTIVATED CLOTTING TIME: 224 s
ACTIVATED CLOTTING TIME: 219 s
Activated Clotting Time: 230 seconds
Activated Clotting Time: 230 seconds
Activated Clotting Time: 219 seconds
Activated Clotting Time: 213 seconds

## 2017-03-08 LAB — CBC
HCT: 27.3 % — ABNORMAL LOW (ref 36.0–46.0)
Hemoglobin: 7.9 g/dL — ABNORMAL LOW (ref 12.0–15.0)
MCH: 23.2 pg — AB (ref 26.0–34.0)
MCHC: 28.9 g/dL — AB (ref 30.0–36.0)
MCV: 80.1 fL (ref 78.0–100.0)
PLATELETS: 263 10*3/uL (ref 150–400)
RBC: 3.41 MIL/uL — ABNORMAL LOW (ref 3.87–5.11)
RDW: 33.5 % — AB (ref 11.5–15.5)
WBC: 20.7 10*3/uL — ABNORMAL HIGH (ref 4.0–10.5)

## 2017-03-08 LAB — HEPARIN LEVEL (UNFRACTIONATED): HEPARIN UNFRACTIONATED: 0.62 [IU]/mL (ref 0.30–0.70)

## 2017-03-08 LAB — COOXEMETRY PANEL
CARBOXYHEMOGLOBIN: 2.2 % — AB (ref 0.5–1.5)
Methemoglobin: 0.7 % (ref 0.0–1.5)
O2 SAT: 80.3 %
Total hemoglobin: 7.5 g/dL — ABNORMAL LOW (ref 12.0–16.0)

## 2017-03-08 LAB — APTT
APTT: 67 s — AB (ref 24–36)
aPTT: 188 seconds (ref 24–36)

## 2017-03-08 LAB — MAGNESIUM: MAGNESIUM: 2.4 mg/dL (ref 1.7–2.4)

## 2017-03-08 LAB — PHOSPHORUS: PHOSPHORUS: 2.7 mg/dL (ref 2.5–4.6)

## 2017-03-08 MED ORDER — PROTAMINE SULFATE 10 MG/ML IV SOLN
10.0000 mg | Freq: Once | INTRAVENOUS | Status: AC
  Administered 2017-03-08: 10 mg via INTRAVENOUS
  Filled 2017-03-08: qty 5

## 2017-03-08 MED ORDER — PRO-STAT SUGAR FREE PO LIQD
60.0000 mL | Freq: Two times a day (BID) | ORAL | Status: DC
  Filled 2017-03-08: qty 60

## 2017-03-08 MED ORDER — MORPHINE SULFATE (PF) 4 MG/ML IV SOLN
1.0000 mg | INTRAVENOUS | Status: AC
  Administered 2017-03-08: 1 mg via INTRAVENOUS
  Filled 2017-03-08: qty 1

## 2017-03-08 MED ORDER — FENTANYL CITRATE (PF) 100 MCG/2ML IJ SOLN
50.0000 ug | INTRAMUSCULAR | Status: DC | PRN
  Administered 2017-03-08 – 2017-03-10 (×8): 50 ug via INTRAVENOUS
  Filled 2017-03-08 (×8): qty 2

## 2017-03-08 MED ORDER — CLINIMIX E/DEXTROSE (5/15) 5 % IV SOLN
INTRAVENOUS | Status: AC
Start: 2017-03-08 — End: 2017-03-09
  Administered 2017-03-08: 22:00:00 via INTRAVENOUS
  Filled 2017-03-08: qty 960

## 2017-03-08 MED ORDER — SODIUM CHLORIDE 0.9 % IV SOLN: 250.0000 mg | Freq: Two times a day (BID) | INTRAVENOUS | Status: DC

## 2017-03-08 MED ORDER — SODIUM CHLORIDE 0.9 % IV SOLN
250.0000 mg | Freq: Two times a day (BID) | INTRAVENOUS | Status: DC
  Administered 2017-03-09: 250 mg via INTRAVENOUS
  Filled 2017-03-08 (×2): qty 2.5

## 2017-03-08 MED ORDER — LEVETIRACETAM 250 MG PO TABS
250.0000 mg | ORAL_TABLET | Freq: Two times a day (BID) | ORAL | Status: DC
  Administered 2017-03-08: 250 mg via ORAL
  Filled 2017-03-08: qty 1

## 2017-03-08 MED ORDER — VITAMIN B-1 100 MG PO TABS
100.0000 mg | ORAL_TABLET | Freq: Every day | ORAL | Status: DC
  Administered 2017-03-08: 100 mg via ORAL
  Filled 2017-03-08: qty 1

## 2017-03-08 MED ORDER — TRAMADOL HCL 50 MG PO TABS
50.0000 mg | ORAL_TABLET | Freq: Two times a day (BID) | ORAL | Status: DC
  Administered 2017-03-08 (×2): 50 mg via ORAL
  Filled 2017-03-08 (×2): qty 1

## 2017-03-08 NOTE — Consult Note (Signed)
CC:  Chief Complaint  Patient presents with  . Weakness    HPI: In brief: Samantha Richards is a 56 y.o. female who initially presented to [facility] on 6/24 after a fall at home and was found to be in hemorrhagic/hypovolemic shock. Had severely symptomatic anemia in setting of GI bleed. Eventually patient went into cardiogenic shock requiring transfer to [facility] for further evaluation by CHF team. She also had progressive renal failure so nephrology was consulted. On heparin & CRT.  Samantha Richards started complaining of diffuse headache 2 days ago. Moderate in severity. Associated with nausea and vomiting. No neuro deficits or changes in vision associated with the headache. No other concerns. Is on CRT so does not ambulate currently although can move her legs well. Denies denies paresthesias of the extremities, slurring speech, focal deficits.   PMH: History reviewed. No pertinent past medical history.  PSH: Past Surgical History:  Procedure Laterality Date  . ESOPHAGOGASTRODUODENOSCOPY (EGD) WITH PROPOFOL N/A 02/20/2017   Procedure: ESOPHAGOGASTRODUODENOSCOPY (EGD) WITH PROPOFOL;  Surgeon: Teena Irani, MD;  Location: [facility];  Service: Endoscopy;  Laterality: N/A;    SH: Social History  Substance Use Topics  . Smoking status: Never Smoker  . Smokeless tobacco: Never Used  . Alcohol use No    MEDS: Prior to Admission medications   Not on File    ALLERGY: Allergies  Allergen Reactions  . Gentamicin Itching and Rash    ROS: Review of Systems  Constitutional: Positive for malaise/fatigue.  Eyes: Negative for blurred vision and double vision.  Gastrointestinal: Positive for nausea and vomiting.  Neurological: Positive for weakness and headaches. Negative for dizziness, tingling, tremors, sensory change, speech change, focal weakness, seizures and loss of consciousness.    Vitals:   03/08/17 1300 03/08/17 1400  BP: (!) 97/57 101/72  Pulse:  94  Resp: 17 20  Temp:     General appearance:  WDWN, appears comfortable Eyes: PERRL Musculoskeletal:     Muscle tone upper extremities: Normal    Muscle tone lower extremities: Normal    Motor exam: Moves all extremities well with good strength  Neurological Awake, alert Answers questions appropriately  Memory and concentration grossly intact Speech fluent, appropriate CNII: Visual fields normal CNIII/IV/VI: EOMI CNV: Facial sensation normal CNVII: Symmetric, normal strength CNVIII: Grossly normal CNIX: Normal palate movement CNXI: Trap and SCM strength normal CN XII: Tongue protrusion normal Sensation grossly intact to LT  IMAGING: CT HEAD IMPRESSION: BILATERAL subdural collections, up to 15 mm thick, greatest in the LEFT middle cranial fossa. In this patient with severe headaches and history of anticoagulation, the findings are consistent with spontaneous LEFT much greater than RIGHT BILATERAL acute subdural hematomas. Up to 7 mm LEFT-to-RIGHT shift is measured at the septum pellucidum. Early uncal herniation is observed. Neurosurgical consultation is warranted Mixed attenuation of the subdural collections, some areas hyperdense and others hypodense, likely represents cell/serum separation in this patient with severe anemia.  IMPRESSION:/PLAN 56 y.o. female with bilateral subdural collections. She is neurologically intact with good strength of her extremities. No surgical intervention necessary as she is neuro intact. Will continue to monitor - Neuro exam q 1 hour - Repeat head CT tomorrow am, sooner as necessary for worsening exam - Keppra 250mg  BID x7 days for seizure prophylaxis - Hold Heparin due to SDH

## 2017-03-08 NOTE — Progress Notes (Signed)
CRITICAL VALUE ALERT  Critical Value:  APTT 188  Date & Time Notied: 03/08/17 1800  Provider Notified: Dr. Tamala Julian  Orders Received/Actions taken: acknowledged, protamine sulfate  Sherrie Mustache

## 2017-03-08 NOTE — Progress Notes (Signed)
[place] consulted to dose protamine for Samantha Richards who was found to have acute subdural hematomas. Patient was receiving heparin at a rate of 1750 units/hr with CRRT. The patient complained of headache and heparin was paused prior to transporting to the [place]. Patient's labs had been collected via central access and ACT's were being used for CRRT monitoring. Daily aPTT were also collected, however it was believed that the daily levels could have been falsely elevated due to the proximity of lab draw and infusion site. A STAT heparin level and aPTT were ordered from a peripheral site upon receiving the CT results. Orders for the reversal were placed immediately upon receiving lab results. RN and MD notified regarding labs and orders placed.    Samantha Richards 03/08/2017,5:42 PM

## 2017-03-08 NOTE — Progress Notes (Signed)
CT head report noted.  ELink Contacting neuro-surg All AC stopped Asked the patient. We have no contact people for her.  Will cont supportive care May need social work consult   Erick Colace ACNP-BC [place] Pager # [phone] OR # [phone] if no answer

## 2017-03-08 NOTE — Progress Notes (Signed)
Advanced Heart Failure Rounding Note  PCP:  Primary Cardiologist: Dr Acie Fredrickson   Subjective:    02/26/17 started on milrinone and placed on lasix drip.   Evening of 02/27/17 pts pressures fell requiring more dopamine, became less responsive and UOP worsening. Pt transferred to [facility].   CVVHD started 7/6   Continue on CVVHD pulling almost 200 cc per hour. Continues on milrinone 0.125 & dopamine 7. . Todays CO-OX is 80%. Weight down another 8 pounds. 52 pounds total  Main complaint this morning is severe headache which started yesterday. Much worse today. No other neuro symptoms.   Feels weak and nauseated. Denies dyspnea. Remains in atrial flutter. She was seen and starting TPN today due to intolerance of tube feeds and poor oral intake      Objective:   Weight Range: 90.6 kg (199 lb 11.8 oz) Body mass index is 33.24 kg/m.   Vital Signs:   Temp:  [97.4 F (36.3 C)-98.7 F (37.1 C)] 97.4 F (36.3 C) (07/14 1100) Pulse Rate:  [93-109] 102 (07/14 1100) Resp:  [14-21] 14 (07/14 1100) BP: (79-104)/(48-76) 98/69 (07/14 1100) SpO2:  [93 %-100 %] 99 % (07/14 1100) Weight:  [90.6 kg (199 lb 11.8 oz)] 90.6 kg (199 lb 11.8 oz) (07/14 0455) Last BM Date: 03/07/17  Weight change: Filed Weights   03/06/17 0500 03/07/17 0700 03/08/17 0455  Weight: 102.4 kg (225 lb 12 oz) 94 kg (207 lb 3.7 oz) 90.6 kg (199 lb 11.8 oz)    Intake/Output:   Intake/Output Summary (Last 24 hours) at 03/08/17 1231 Last data filed at 03/08/17 1200  Gross per 24 hour  Intake            972.4 ml  Output             5599 ml  Net          -4626.6 ml     Physical Exam   CVP 13-14 General:  Chronically ill appearing. No resp difficulty HEENT: normal Neck: supple. RIJ trialysis. RIJ TLC. JVP jaw  No lymphadenopathy or thryomegaly appreciated.  Cor: PMI nonpalpable tachycardic and regular 2/6 TR murmur Lungs: clear anteriorly Abdomen: obese soft, nontender, nondistended. No hepatosplenomegaly. No bruits  or masses. Good bowel sounds. Extremities: no cyanosis, clubbing, rash, R and LLE 3+ edema. Sloughing skin Neuro: alert & oriented x 3, cranial nerves grossly intact. moves all 4 extremities w/o difficulty. Affect pleasant Skin: Partial thickness wounds on buttocks.   Telemetry   A flutter 100-110s. Personally reviewed.    EKG   Previously viewed from 02/24/2017 A Flutter 100 bpm   Labs    CBC  Recent Labs  03/07/17 0427 03/08/17 0509  WBC 21.3* 20.7*  HGB 8.0* 7.9*  HCT 26.9* 27.3*  MCV 79.6 80.1  PLT 226 235   Basic Metabolic Panel  Recent Labs  03/07/17 0427 03/07/17 1600 03/08/17 0509  NA 137 136 135  136  K 3.6 3.6 4.3  4.3  CL 103 103 101  102  CO2 27 27 27  28   GLUCOSE 139* 86 103*  107*  BUN 12 13 14  14   CREATININE 1.07* 1.12* 1.11*  1.10*  CALCIUM 8.2* 8.2* 8.6*  8.6*  MG 2.3  --  2.4  PHOS 2.1* 2.4* 2.7  2.8   Liver Function Tests  Recent Labs  03/07/17 1600 03/08/17 0509  ALBUMIN 2.2* 2.2*     BNP (last 3 results)  Recent Labs  02/16/17 1516  BNP  923.6*       Imaging   Transthoracic Echocardiography Study Conclusions  - Left ventricle: Septal flattening consistant with elevated RV   pressures. Systolic function was moderately to severely reduced.   The estimated ejection fraction was in the range of 30% to 35%.   Diffuse hypokinesis. - Mitral valve: There was mild regurgitation. - Left atrium: The atrium was mildly dilated. - Right ventricle: The cavity size was moderately dilated. - Right atrium: The atrium was moderately dilated. - Atrial septum: No defect or patent foramen ovale was identified. - Tricuspid valve: There was moderate-severe regurgitation. - Pericardium, extracardiac: A trivial pericardial effusion was   identified. - Impressions: Suspect PA pressure underestimated by TR velocity   due to RV dysfunction. There is moderate RV enlargement with   severe hypokinesis and signs of significant cor  pulmonale.  Impressions:  - Suspect PA pressure underestimated by TR velocity due to RV   dysfunction. There is moderate RV enlargement with severe   hypokinesis and signs of significant cor pulmonale.     Medications:     Scheduled Medications: . Chlorhexidine Gluconate Cloth  6 each Topical Daily  . feeding supplement (ENSURE ENLIVE)  237 mL Oral BID BM  . feeding supplement (PRO-STAT SUGAR FREE 64)  60 mL Oral BID  . fluticasone  2 spray Each Nare Daily  . insulin aspart  0-15 Units Subcutaneous Q4H  . levothyroxine  100 mcg Oral Daily  . multivitamin with minerals  1 tablet Oral Daily  . pantoprazole  40 mg Oral BID AC  . phosphorus  500 mg Oral TID  . potassium chloride  20 mEq Oral BID  . senna-docusate  1 tablet Oral BID  . sodium chloride flush  10-40 mL Intracatheter Q12H  . thiamine  100 mg Oral Daily  . traMADol  50 mg Oral Q12H    Infusions: . Marland KitchenTPN (CLINIMIX-E) Adult    . sodium chloride    . amiodarone 30 mg/hr (03/08/17 1200)  . DOPamine 7 mcg/kg/min (03/08/17 1200)  . heparin 10,000 units/ 20 mL infusion syringe 1,700 Units/hr (03/08/17 1114)  . heparin    . milrinone 0.125 mcg/kg/min (03/08/17 1200)  . dialysis replacement fluid (prismasate) 500 mL/hr at 03/07/17 0353  . dialysis replacement fluid (prismasate) 300 mL/hr at 03/07/17 2203  . dialysate (PRISMASATE) 1,000 mL/hr at 03/08/17 1005    PRN Medications: sodium chloride, acetaminophen (TYLENOL) oral liquid 160 mg/5 mL, acetaminophen, alum & mag hydroxide-simeth, camphor-menthol, guaiFENesin-dextromethorphan, heparin, heparin, heparin, hydrOXYzine, iopamidol, lip balm, menthol-cetylpyridinium, ondansetron (ZOFRAN) IV, phenol, promethazine, promethazine, sodium chloride flush    Patient Profile  56 y/o woman with morbid obesity and little previous medical follow-up. Admitted 6/24 with severe anemia (hgb 3.7) and cardiogenic shock due biventricular HF R>L. Echo reviewed EF 30-35% with severe RV  Failure and PAH. Has anasarca and progressive renal failure with inability to mobilize fluid. Transferred to [facility] on 7/4 for further evaluation by CHF team.    Assessment/Plan   1. 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  Low volts on ECG concerning for amyloid. Creatinine too high for CMRI. Consider TPY scan when more stable.  - Todays CO-OX 80%. Continue milrinone and dopamine until fluid removal complete. Can wean dopamine as needed - CVP 13.  Continue CVVHD. Pulling 175-200/hr. Weight  down 51 pounds total and probably another 30-40 to go . Appreciate renal input - No beta blocker with acute decompensation.  - Eventually will need VQ and R heart cath and sleep study  2. AKI  - Remains on CVVHD. Continue volume removal with inotropes for pressure support - Appreciate Renal input  3. Iron deficiency anemia - Hgb on admit was 3.7. With low MCV. Iron stores low. - EGD 02/20/17 with severe esophagitis  - Received feraheme 02/26/2017.  - Hgb 7.9. Will likely need transfusion soon - Will need colonoscopy when more stable.   4. Atrial flutter - Rate just mildly elevated in setting of dopamine and milrinone. Continue amio. - Continue heparin as tolerated.   - Will eventually need  DC-CV and possible ablation  5. Anasarca/ascites in setting of severe protein calorie malnutition - CT abdomen with hepatic stenosis.  - Likely due to combination of RHF and fatty liver. Albumin 2.7 02/18/17 - prealbumin was < 5 on 02/18/17. - Failed tube feeds. Intake remains poor. I have contacted pharmacy to initiate TPN   7. Leukocytosis - Persistently elevated around 20K.Marland Kitchen Has completed rocephin for UTI  -RLE wound does not appear infected. Buttock wounds appear partial thickness with evidence of induration.  PCT  only 1.44 02/27/17.  - Seen by Dr. Beryle Beams on 7/9. Smear ok. No evidence of toxic process or malignancy at this point.  - Can proceed with further workup as needed  8. Severe fibroid uterus  9. Morbid obesity - Nutrition following .    10. RLE wound Continue foam dressing on RLE wound.  - No change to current plan.   11. Probable OSA/OHS - Bipap as needed.   12-MASD (Moisture Associated Skin Damage) Buttock Wounds- Partial thickness- dose not appear infected. Appears to be related to moisture and fecal incontinence.  Prealbumin <5.  Wound Care appreciated.   13. Severe headache - Will get noncontrast head CT today as she has been on anticoagulation and had a getting worse - Will give fentanyl for breakthrough pain   Length of Stay: 20  Glori Bickers, MD  03/08/2017, 12:31 PM  Advanced Heart Failure Team Pager [phone] (M-F; 7a - 4p)  Please contact [facility] for night-coverage after hours (4p -7a ) and weekends on [email or web address]

## 2017-03-08 NOTE — Progress Notes (Signed)
Patient vomited immediately after taking PO medication. Unsure if patient held down medications. Dr. Tamala Julian to change medications to IV if appropriate.

## 2017-03-08 NOTE — Progress Notes (Signed)
CVVHD management.  Metabolically stable. Still on dopamine and milrinone.  Pulling 175-200cc/hr.  Making progress slowly.

## 2017-03-08 NOTE — Progress Notes (Addendum)
Paged Dr. Jonnie Finner, patient with changes on CT, per CCM no heparin in CRRT, spoke with nephrology about discussion with CCM with inquires about starting CRRT without heparin vs not resuming CRRT, MD acknowledged and stated he would notify RN with orders, a/w callback, as of now CRRT has been stopped since 1430 prior to going to CT, will continue monitor patient.   Sherrie Mustache 4:31 PM   Spoke with Dr. Jonnie Finner will like to keep CRRT off for now. Sherrie Mustache 5:43 PM

## 2017-03-08 NOTE — Progress Notes (Signed)
PHARMACY - ADULT TOTAL PARENTERAL NUTRITION CONSULT NOTE   Pharmacy Consult:  TPN Indication: Intolerance to EN  Patient Measurements: Height: 5\' 5"  (165.1 cm) Weight: 199 lb 11.8 oz (90.6 kg) IBW/kg (Calculated) : 57 TPN AdjBW (KG): 68.4 Body mass index is 33.24 kg/m.  Assessment:  28 YOF with no PMH presented on 02/16/17 with symptomatic anemia s/p a mechanical fall 2 weeks prior and some bloody stool for 2-3 weeks.  EGD on 02/20/17 showed esophagitis with local hemorrhage, gastritis and hiatal hernia and a biopsy was also completed.  Hospital course complicated by AKI and shock with RV failure.  Patient did not have anything to eat or drink for 3 days PTA because she was unable to move.  RD unable to assess malnutrition given that muscle/fat wasting could be masked by severe edema.  Patient had intermittent PO intake throughout hospitalization until 03/05/17 when TF was initiated.  On 03/07/17, patient requested feeding tube to be removed.  MD concerned with patient's prognosis if nutrition is inadequate; therefore, Pharmacy consulted to initiate TPN.  Patient is nutritionally at risk.  GI: admitted with GIB, H.pylori negative - PPI PO, PRN Zofran/Phenergan Endo: hypothyroid on Synthroid.  No hx DM - CBGs controlled Insulin requirements in the past 24 hours: 4 units SSI Lytes: all WNL with supplementation  Renal: AKI started CRRT 7/6 - SCr 1.1, BUN 14.  Net -4.2L yesterday, -24L since admit Pulm: cor pulmonale - RA >> 2L [place] Cards: Aflutter - hypotensive, tachy, CVP 6 - amiodarone, dopamine, milrinone gtts Hepatobil: LFTs / tbili WNL Neuro: A&O.  Pain score 0-6, PRN APAP ID: s/p Vanc/CTX - afebrile, WBC down 20.7 Best Practices: SCDs, CHG TPN Access: left-IJ triple lumen 02/18/17 TPN start date: 03/08/17  Nutritional Goals (per RD recommendation on 7/1): 2000-2300 kCal and 125-150 gm protein per day  Current Nutrition:  Heart diet Ensure Enlive (received 1 in the past 10  days) Prostat 51mL BID (received 1 yesterday)   Plan:  - Initiate Clinimix E 5/15 at 40 ml/hr.  TPN will provide 1162 kCal, 48 gm of protein, and IL of IVF per day.  Advance to goal rate of 83-105 ml/hr pending clinical progress/volume status. - Hold lipid for the first 7 days on TPN in ICU patients per ASPEN/SCCM guidelines (start date 03/15/17). - Multivitamin PO daily.  No trace elements in TPN as they are provided in PO multivitamin. - KPhos Neutral 500mg  PO TID, KCL 84mEq PO BID, thiamine 100mg  PO daily per MD - Standard TPN labs and nursing care orders - Patient's GI tract is intact.  Please re-attempt/encourage tube feeding as appropriate.   Nyisha Clippard D. Mina Marble, PharmD, BCPS Pager:  [phone] 03/08/2017, 10:17 AM

## 2017-03-08 NOTE — Progress Notes (Signed)
PULMONARY / CRITICAL CARE MEDICINE   Name: Samantha Richards MRN: [id number] DOB: Aug 03, 1961    ADMISSION DATE:  02/16/2017 CONSULTATION DATE:  02/27/17  REFERRING MD:  Dr. Wynetta Emery  CHIEF COMPLAINT:  Hypotension  BRIEF SUMMARY:   Samantha Richards is a 56 y.o. female who presented to [facility] on 6/24 after a fall at home and was found to be in hemorrhagic/hypovolemic and cardiogenic shock. She was severely anemic to Hgb 3.7 in the setting of GI bleed. Requiring transfusions, pressors and BiPAP in ICU. She was also treated for cellulitis of the back. Levophed was weaned 6/28 and the patient was transferred to hospitalist service 6/29. Cardiology has been managing AFib with RVR with amiodarone gtt and biventricular heart failure R>L with lasix gtt. She had cardiogenic shock prompting transfer to [facility] on 7/4 for further evaluation by CHF team and initiation of milrinone and dopamine gtt. She also has anasarca and progressive renal failure with oliguria and hypotension.  Nephrology consulted, CVVHD initiated 7/6.    SUBJECTIVE:  No distress but not eating   VITAL SIGNS: BP 93/65 (BP Location: Left Arm)   Pulse 93   Temp (!) 97.5 F (36.4 C) (Oral)   Resp 16   Ht 5\' 5"  (1.651 m)   Wt 199 lb 11.8 oz (90.6 kg)   LMP 01/16/2017 Comment: neg preg 02-16-2017  SpO2 99%   BMI 33.24 kg/m  Room air  HEMODYNAMICS: CVP:  [12 mmHg-25 mmHg] 13 mmHg  VENTILATOR SETTINGS:    INTAKE / OUTPUT:  Intake/Output Summary (Last 24 hours) at 03/08/17 0843 Last data filed at 03/08/17 0800  Gross per 24 hour  Intake           1290.4 ml  Output             5535 ml  Net          -4244.6 ml     PHYSICAL EXAMINATION: Physical Exam  Constitutional: She is oriented to person, place, and time. She appears cachectic. She is cooperative.  Non-toxic appearance. She has a sickly appearance.  HENT:  Head: Normocephalic and atraumatic.  Mouth/Throat: Oropharynx is clear and moist.  Eyes: Pupils are equal, round,  and reactive to light. Conjunctivae and EOM are normal.  Neck: Normal range of motion. No JVD present.  Cardiovascular: Normal rate and regular rhythm.   Murmur heard. Pulmonary/Chest: Effort normal and breath sounds normal.  Abdominal: Soft. Bowel sounds are normal.  Musculoskeletal: Normal range of motion. She exhibits no edema or tenderness.  Neurological: She is alert and oriented to person, place, and time.  Skin: Skin is warm and dry.  Psychiatric: Her speech is normal. Thought content normal. She is withdrawn. She exhibits a depressed mood.    LABS:  BMET  Recent Labs Lab 03/07/17 0427 03/07/17 1600 03/08/17 0509  NA 137 136 135  136  K 3.6 3.6 4.3  4.3  CL 103 103 101  102  CO2 27 27 27  28   BUN 12 13 14  14   CREATININE 1.07* 1.12* 1.11*  1.10*  GLUCOSE 139* 86 103*  107*   Electrolytes  Recent Labs Lab 03/06/17 1637 03/07/17 0427 03/07/17 1600 03/08/17 0509  CALCIUM 8.5* 8.2* 8.2* 8.6*  8.6*  MG 2.3 2.3  --  2.4  PHOS 4.2  2.2* 2.1* 2.4* 2.7  2.8   CBC  Recent Labs Lab 03/06/17 0310 03/07/17 0427 03/08/17 0509  WBC 19.8* 21.3* 20.7*  HGB 7.7* 8.0* 7.9*  HCT  26.0* 26.9* 27.3*  PLT 263 226 263   Coag's  Recent Labs Lab 03/06/17 0310 03/07/17 0427 03/08/17 0509  APTT >200* >200* >200*   Sepsis Markers  Recent Labs Lab 03/03/17 0407  PROCALCITON 0.40   ABG  Recent Labs Lab 03/07/17 0500  PHART 7.463*  PCO2ART 39.7  PO2ART 61.9*   Liver Enzymes  Recent Labs Lab 03/07/17 0427 03/07/17 1600 03/08/17 0509  ALBUMIN 2.3* 2.2* 2.2*   Cardiac Enzymes No results for input(s): TROPONINI, PROBNP in the last 168 hours.  Glucose  Recent Labs Lab 03/07/17 1211 03/07/17 1557 03/07/17 2016 03/07/17 2350 03/08/17 0404 03/08/17 0803  GLUCAP 123* 81 106* 112* 107* 96   Imaging No results found. STUDIES:  XR right Knee 6/24 >> No Acute CT abd/pelvis 6/25 >> anasarca with b/l pleural effusions, ascities. Fibroid  uterus. Cardiomegaly with severe R atrial dilation. TTE 6/25 >> EF30-35%, diffuse hypokinesis, LA dilated, mild MR, RA mod dilated with signs of significant cor pulmonale. LE venous doppler 6/25 >> no DVT or SVT b/l US Paracentesis 6/26 >> 2.5 L peritoneal fluid yielded, cytology negative EGD 6/28 >> intense distal esophagitis with focal hemorrhagic hemorrhage, small hiatal hernia CXR 7/5 >> Cardiomegaly with vascular congestion, R midlung atelectasis with LLL opacity.  CULTURES: MRSA PCR neg 6/24 Blood cultures 6/24 > 1 of 2 coag neg staph, likely contaminate Peritoneal fluid culture 6/26 > No growth 5 days, neg for AFB HIV 6/29 > negative  ANTIBIOTICS: Vancomycin 6/24 > 6/28 Rocephin 6/25 > 7/2 Keflex 7/2 > 7/4 Rocephin 7/6 >>7/12  SIGNIFICANT EVENTS: 6/24  Presents to [facility], admitted to ICU  6/29  Off vasopressors, transferred out to SDU 7/02  Lasix gtt initiated 7/04  Transferred to [facility]. Milrinone started 7/05  Dopamine started, transfer to ICU 7/06  Diffuse pain, requires pressor support with dopamine and a lasix drip.  Nausea.  CVVHD initiated.  7/07  Removed 2.3L in last 24 hours, remains positive balance.    LINES/TUBES: L IJ CVL 6/26 >> Foley 6/24 >> R IJ HD 7/6 >>   DISCUSSION: 56 y.o. female who presented after a fall at home found to be in hemorrhagic/hypovolemic with severe anemia and cardiogenic shock due to biventricular heart failure. Now with worsening anasarca and progressive renal failure with oliguria and hypotension.  CVVHD initiated 7/6.   ->cont ICU level support given Inotrope requirements and CRRT  ASSESSMENT / PLAN:  PULMONARY A: Suspected OHS/OSA - intolerant of CPAP while inpatient (may have been related to fluid status) pcxr reviewed left basilar volume loss but no edema  P:   Wean O2 pulm hygiene   CARDIOVASCULAR A:  Acute systolic heart failure with RV overload/cor pulmonale  Afib with RVR, improved Cardiogenic shock  P:  CRRT for  volume removal.  Heart failure team managing hemodynamic support w/ inotrope titration  Cont tele   RENAL A:   Acute Kidney Injury/ cardiorenal syndrome  Oliguria P:  CRRT per nephrology Replace lytes as needed  Avoid nephrotoxins    GASTROINTESTINAL A:   Upper GI bleed on EGD Severe protein-calorie malnutrition with kwashiokor Hepatic steatosis Ascites > cytology negative Peritoneal mets vs uterine fibroids P:   Cont ppi bid  Cont diet (nutritional services following) HF team started TPA (not a great candidate for this)  HEMATOLOGIC A:   Anemia, likely multifactorial from GI bleed and chronic malnutrition hgb holding  P:  Trend cbc Transfuse for hgb < 7 Cont feso4 replacement  scds   INFECTIOUS A:  Skin breakdown/wounds Cellulitis RLE Stage II Decubitus to Bilateral Buttock, RLE UTI  Off abx. No sig fever or change in leukocytosis  P:   Cont wound care for BLE Monitor for skin breakdown   ENDOCRINE A:   Mild hyperglycemia Hypothyroid P:   Repeat TSH around 8/1 Cont synthroid  Cont ssi protocol   NEUROLOGIC A:   Severe Deconditioning  P:   Cont PT   FAMILY  - Updates: Patient updated bedside   Erick Colace ACNP-BC [place] Pager # [phone] OR # [phone] if no answer   03/08/2017, 8:35 AM

## 2017-03-08 NOTE — Progress Notes (Signed)
[place] consulted to dose protamine for Marylou Flesher who was found to have acute subdural hematomas. Last heparin level was undetectable and aPTT is down to 67. No need to give another dose of protamine at this point.   Rx will sign off   Elenor Quinones, PharmD, [facility] Clinical Pharmacist Pager [phone] 03/08/2017 11:07 PM

## 2017-03-08 NOTE — Progress Notes (Signed)
[place] Progress Note Patient Name: Ciarrah Rae DOB: Aug 16, 1961 MRN: [id number]   Date of Service  03/08/2017  HPI/Events of Note  keppra changed to IV.  Not tolerating oral intake  eICU Interventions       Intervention Category Evaluation Type: Other  Mauri [place], P 03/08/2017, 10:27 PM

## 2017-03-08 NOTE — Progress Notes (Signed)
[place] Progress Note Patient Name: Samantha Richards DOB: December 19, 1960 MRN: [id number]   Date of Service  03/08/2017  HPI/Events of Note  Received result of head CT.  Acute bilat SDH with midline shift.  Patient was on CRRT with heparin.  Patient awake but sleepy with headache today.   eICU Interventions  CRRT being held now.  Cannot restart until heparin out of circuit. Neurosurgery paged urgently     Intervention Category Major Interventions: Other:  Mauri Brooklyn, Mamie Nick 03/08/2017, 3:43 PM

## 2017-03-08 NOTE — Progress Notes (Addendum)
Pt not able to get TNA d/t placement of left IJ CVL, Dr. Tamala Julian notified, okay to continue to give current iv infusions and MD will have line pulled back, will continue to monitor for now.   Kathleen Argue S 5:59 PM

## 2017-03-09 ENCOUNTER — Inpatient Hospital Stay (HOSPITAL_COMMUNITY): Payer: Self-pay

## 2017-03-09 DIAGNOSIS — I6200 Nontraumatic subdural hemorrhage, unspecified: Secondary | ICD-10-CM

## 2017-03-09 LAB — RENAL FUNCTION PANEL
ALBUMIN: 2.4 g/dL — AB (ref 3.5–5.0)
ANION GAP: 7 (ref 5–15)
Albumin: 2.4 g/dL — ABNORMAL LOW (ref 3.5–5.0)
Anion gap: 6 (ref 5–15)
BUN: 15 mg/dL (ref 6–20)
BUN: 15 mg/dL (ref 6–20)
CALCIUM: 9.1 mg/dL (ref 8.9–10.3)
CALCIUM: 9.3 mg/dL (ref 8.9–10.3)
CHLORIDE: 102 mmol/L (ref 101–111)
CO2: 26 mmol/L (ref 22–32)
CO2: 27 mmol/L (ref 22–32)
CREATININE: 1.47 mg/dL — AB (ref 0.44–1.00)
CREATININE: 1.39 mg/dL — AB (ref 0.44–1.00)
Chloride: 102 mmol/L (ref 101–111)
GFR calc Af Amer: 46 mL/min — ABNORMAL LOW (ref >60)
GFR calc Af Amer: 49 mL/min — ABNORMAL LOW (ref >60)
GFR calc non Af Amer: 40 mL/min — ABNORMAL LOW (ref >60)
GFR calc non Af Amer: 42 mL/min — ABNORMAL LOW (ref >60)
GLUCOSE: 145 mg/dL — AB (ref 65–99)
GLUCOSE: 136 mg/dL — AB (ref 65–99)
PHOSPHORUS: 3.6 mg/dL (ref 2.5–4.6)
Phosphorus: 3.1 mg/dL (ref 2.5–4.6)
Potassium: 4.3 mmol/L (ref 3.5–5.1)
Potassium: 4.1 mmol/L (ref 3.5–5.1)
SODIUM: 135 mmol/L (ref 135–145)
SODIUM: 135 mmol/L (ref 135–145)

## 2017-03-09 LAB — CBC
HCT: 26.8 % — ABNORMAL LOW (ref 36.0–46.0)
Hemoglobin: 8.1 g/dL — ABNORMAL LOW (ref 12.0–15.0)
MCH: 23.7 pg — AB (ref 26.0–34.0)
MCHC: 30.2 g/dL (ref 30.0–36.0)
MCV: 78.4 fL (ref 78.0–100.0)
PLATELETS: 263 10*3/uL (ref 150–400)
RBC: 3.42 MIL/uL — AB (ref 3.87–5.11)
RDW: 33.6 % — AB (ref 11.5–15.5)
WBC: 25.4 10*3/uL — ABNORMAL HIGH (ref 4.0–10.5)

## 2017-03-09 LAB — GLUCOSE, CAPILLARY
GLUCOSE-CAPILLARY: 135 mg/dL — AB (ref 65–99)
GLUCOSE-CAPILLARY: 147 mg/dL — AB (ref 65–99)
GLUCOSE-CAPILLARY: 150 mg/dL — AB (ref 65–99)
Glucose-Capillary: 123 mg/dL — ABNORMAL HIGH (ref 65–99)
Glucose-Capillary: 130 mg/dL — ABNORMAL HIGH (ref 65–99)
Glucose-Capillary: 131 mg/dL — ABNORMAL HIGH (ref 65–99)
Glucose-Capillary: 144 mg/dL — ABNORMAL HIGH (ref 65–99)
Glucose-Capillary: 149 mg/dL — ABNORMAL HIGH (ref 65–99)

## 2017-03-09 LAB — APTT: aPTT: 47 seconds — ABNORMAL HIGH (ref 24–36)

## 2017-03-09 LAB — COOXEMETRY PANEL
Carboxyhemoglobin: 2.3 % — ABNORMAL HIGH (ref 0.5–1.5)
METHEMOGLOBIN: 0.7 % (ref 0.0–1.5)
O2 SAT: 87.1 %
TOTAL HEMOGLOBIN: 8.3 g/dL — AB (ref 12.0–16.0)

## 2017-03-09 LAB — MAGNESIUM: Magnesium: 2.3 mg/dL (ref 1.7–2.4)

## 2017-03-09 MED ORDER — CHLORHEXIDINE GLUCONATE 0.12 % MT SOLN
15.0000 mL | Freq: Two times a day (BID) | OROMUCOSAL | Status: DC
  Administered 2017-03-09 – 2017-03-12 (×7): 15 mL via OROMUCOSAL
  Filled 2017-03-09 (×2): qty 15

## 2017-03-09 MED ORDER — ORAL CARE MOUTH RINSE
15.0000 mL | Freq: Two times a day (BID) | OROMUCOSAL | Status: DC
  Administered 2017-03-10 (×2): 15 mL via OROMUCOSAL

## 2017-03-09 MED ORDER — FENTANYL 2500MCG IN NS 250ML (10MCG/ML) PREMIX INFUSION: 25.0000 ug/h | INTRAVENOUS | Status: DC

## 2017-03-09 MED ORDER — LEVETIRACETAM 500 MG/5ML IV SOLN
500.0000 mg | Freq: Two times a day (BID) | INTRAVENOUS | Status: DC
  Administered 2017-03-09 – 2017-03-13 (×9): 500 mg via INTRAVENOUS
  Filled 2017-03-09 (×10): qty 5

## 2017-03-09 MED ORDER — CLINIMIX/DEXTROSE (5/15) 5 % IV SOLN
INTRAVENOUS | Status: AC
  Administered 2017-03-09: 17:00:00 via INTRAVENOUS
  Filled 2017-03-09: qty 1992

## 2017-03-09 NOTE — Progress Notes (Signed)
Received call from nursing in regards to change in mental status that occurred approx 0240 today Alert and oriented up until then. Now can only state date of birth when questioned about year, birthday and location. Stat Head CT ordered prior to my arrival.  Otherwise, she can communicate and tell me she has a headache, she can point to the area of pain, she can respond to other ROS questions like N/T of extremities, pain in extremities which she denies.  She follows all commands including movement of her extremities. CN appear grossly intact. No gaze preference, no facial droop, no tongue deviation.  I do not see a change in her functional status from a neurological standpoint.  Based on location of subdural, it is possible she has a little expressive aphasia, but it does not necessarily make sense that she can communicate accurately otherwise to everything else except location and year.   I have discussed the case with Dr Kathyrn Sheriff who has also reviewed the imaging. Do not see a drastic change in the CT when compared to yesterday Will hold off on any emergent surgical intervention. Will continue to monitor her  Increase Keppra to 500mg  BID

## 2017-03-09 NOTE — Progress Notes (Signed)
   03/09/17 0240  Charting Type  Charting Type Reassessment  Neurological  Neuro (WDL) X  Level of Consciousness Responds to Voice  Orientation Level Disoriented to place;Disoriented to time;Disoriented to situation  [employer]ed forces training and education officer Expressive aphasia  R Pupil Size (mm) 4  R Pupil Shape Round  R Pupil Reaction Sluggish  L Pupil Size (mm) 3  L Pupil Shape Round  L Pupil Reaction Sluggish  Additional Pupil Assessments No  R Hand Grip Present;Weak  L Hand Grip Present;Weak   R Foot Dorsiflexion Weak  L Foot Dorsiflexion Weak  R Foot Plantar Flexion Weak  L Foot Plantar Flexion Weak  RUE Motor Response Purposeful movement;Responds to commands  LUE Motor Response Purposeful movement;Responds to commands  RLE Motor Response Purposeful movement;Responds to commands  LLE Motor Response Purposeful movement;Responds to commands  Neuro Symptoms Drowsiness  Glasgow Coma Scale  Eye Opening 3  Best Verbal Response (NON-intubated) 3  Best Motor Response 6  Glasgow Coma Scale Score 12  Patient has had a change in mental status. She has become more lethargic and is only able to verbalize her date of birth. Each additional question she answers by repeating her date of birth. Dr. Nicole Kindred with Neurology was notified and order for stat CT of the head was ordered.

## 2017-03-09 NOTE — Progress Notes (Signed)
CVVHD management.  Metabolically stable.  Has developed subdural hematoma and off CRRT to get CT head done.  She is awake and alert but only oriented x 1.  Will resume CVVHD but wo heparin.  If issues with clotting then will use citrate protocol.  Still on dopamine and milrinone

## 2017-03-09 NOTE — Progress Notes (Signed)
PHARMACY - ADULT TOTAL PARENTERAL NUTRITION CONSULT NOTE   Pharmacy Consult:  TPN Indication: Intolerance to EN  Patient Measurements: Height: 5\' 5"  (165.1 cm) Weight: 186 lb 1.1 oz (84.4 kg) IBW/kg (Calculated) : 57 TPN AdjBW (KG): 68.4 Body mass index is 30.96 kg/m.  Assessment:  60 YOF with no PMH presented on 02/16/17 with symptomatic anemia s/p a mechanical fall 2 weeks prior and some bloody stool for 2-3 weeks.  EGD on 02/20/17 showed esophagitis with local hemorrhage, gastritis and hiatal hernia and a biopsy was also completed.  Hospital course complicated by AKI and shock with RV failure.  Patient did not have anything to eat or drink for 3 days PTA because she was unable to move.  RD unable to assess malnutrition given that muscle/fat wasting could be masked by severe edema.  Patient had intermittent PO intake throughout hospitalization until 03/05/17 when TF was initiated.  On 03/07/17, patient requested feeding tube to be removed.  MD concerned with patient's prognosis if nutrition is inadequate; therefore, Pharmacy consulted to manage TPN.  Patient is nutritionally at risk.  GI: admitted with GIB, H.pylori negative - NG O/P 110mL - PPI PO, PRN Zofran/Phenergan Endo: hypothyroid on Synthroid.  No hx DM - CBGs controlled Insulin requirements in the past 24 hours: 2 units SSI Lytes: all WNL Renal: AKI, CRRT 7/6 >> 7/14, restart 7/15 - SCr 1.47, BUN 15.  Net -24L since admit Pulm: cor pulmonale - back on RA Cards: Afib/Aflutter - MAP 80-90s, CVP 18 - amiodarone, dopamine, milrinone gtts Heme: acute subdural hematoma s/p protamine 7/14 - hgb 8.1, plts WNL, aPTT 47 Hepatobil: LFTs / tbili WNL Neuro: Keppra x7 days for sz px.  Pain score 2-10, PRN APAP ID: s/p Vanc/CTX - afebrile, WBC up to 25.4 Best Practices: SCDs, CHG TPN Access: left-IJ triple lumen 02/18/17 TPN start date: 03/08/17  Nutritional Goals (per RD recommendation on 7/1): 2000-2300 kCal and 125-150 gm protein per  day  Current Nutrition:  Heart diet Ensure Enlive (received 1 yesterday) Prostat 35mL BID (received 0 yesterday) TPN   Plan:  - Change to Clinimix to 5/15 (no electrolytes) and increase to 83 ml/hr.  Clinimix will provide 1414 kCal, 100gm of protein and 2L IVF per day (Renal aware).  TPN is meeting 71% of kCal and 80% of protein needs.   - Hold lipid for the first 7 days on TPN in ICU patients per ASPEN/SCCM guidelines (start date 03/15/17). - Multivitamin PO daily.  No trace elements in TPN as they are provided in PO multivitamin. - KPhos Neutral 500mg  PO TID, KCL 23mEq PO BID, thiamine 100mg  PO daily per MD.  F/U mental status and ability to take PO meds. - F/U AM labs - Patient's GI tract is intact.  Please re-attempt/encourage tube feeding as appropriate.   Kavari Parrillo D. Mina Marble, PharmD, BCPS Pager:  [phone] 03/09/2017, 9:17 AM

## 2017-03-09 NOTE — Progress Notes (Signed)
Pt thought to have had some change in exam early this am, ?perseverating. Currently has no new c/o, unchanged HA.  EXAM:  BP 102/69   Pulse 97   Temp (!) 97.5 F (36.4 C) (Core (Comment))   Resp (!) 31   Ht 5\' 5"  (1.651 m)   Wt 84.4 kg (186 lb 1.1 oz)   LMP 01/16/2017 Comment: neg preg 02-16-2017  SpO2 98%   BMI 30.96 kg/m   Awake, alert, oriented  Speech fluent, appropriate  CN grossly intact  MAE well  IMPRESSION:  56 y.o. female with left tentorial SDH. Appears to be neurologically stable, essentially normal. She has severe cardiorenal syndrome and is very cachectic. With her current exam and medical condition, I believe operative evacuation has a much higher likelihood of causing worsening in her condition than improving her.  PLAN: - Cont neurologic observation - Keppra 500 BID

## 2017-03-09 NOTE — Progress Notes (Signed)
Patient back from CT scan. Dr. Nicole Kindred called to notify RN that CT head results are worse. Neuro surgery paged as recommended.

## 2017-03-09 NOTE — Progress Notes (Signed)
Spoke with the Neurosurgery PA. He plans to come in and see the patient.

## 2017-03-09 NOTE — Progress Notes (Signed)
PULMONARY / CRITICAL CARE MEDICINE   Name: Samantha Richards MRN: [id number] DOB: July 25, 1961    ADMISSION DATE:  02/16/2017 CONSULTATION DATE:  02/27/17  REFERRING MD:  Dr. Wynetta Emery  CHIEF COMPLAINT:  Hypotension  BRIEF SUMMARY:   Samantha Richards is a 56 y.o. female who presented to [facility] on 6/24 after a fall at home and was found to be in hemorrhagic/hypovolemic and cardiogenic shock. She was severely anemic to Hgb 3.7 in the setting of GI bleed. Requiring transfusions, pressors and BiPAP in ICU. She was also treated for cellulitis of the back. Levophed was weaned 6/28 and the patient was transferred to hospitalist service 6/29. Cardiology has been managing AFib with RVR with amiodarone gtt and biventricular heart failure R>L with lasix gtt. She had cardiogenic shock prompting transfer to [facility] on 7/4 for further evaluation by CHF team and initiation of milrinone and dopamine gtt. She also has anasarca and progressive renal failure with oliguria and hypotension.  Nephrology consulted, CVVHD initiated 7/6.    SUBJECTIVE:  More sleepy CT worse   VITAL SIGNS: BP 105/78   Pulse 89   Temp 97.6 F (36.4 C) (Oral)   Resp (!) 21   Ht 5\' 5"  (1.651 m)   Wt 186 lb 1.1 oz (84.4 kg)   LMP 01/16/2017 Comment: neg preg 02-16-2017  SpO2 98%   BMI 30.96 kg/m  Room air  HEMODYNAMICS: CVP:  [18 mmHg] 18 mmHg  VENTILATOR SETTINGS:    INTAKE / OUTPUT:  Intake/Output Summary (Last 24 hours) at 03/09/17 0840 Last data filed at 03/09/17 0700  Gross per 24 hour  Intake          1414.97 ml  Output             1423 ml  Net            -8.03 ml     PHYSICAL EXAMINATION: Physical Exam  Constitutional: She is oriented to person, place, and time. She appears lethargic. She is cooperative. She has a sickly appearance.  HENT:  Head: Normocephalic and atraumatic.  Eyes: Conjunctivae and EOM are normal. No scleral icterus.  Cardiovascular: Regular rhythm and normal pulses.   Murmur heard. Marked  anasarca   Pulmonary/Chest: Effort normal. No accessory muscle usage. No tachypnea. No respiratory distress. She has decreased breath sounds.  Abdominal: Soft. Normal appearance. There is no tenderness.  Neurological: She is oriented to person, place, and time. She appears lethargic.  Skin: Skin is warm and dry.  Psychiatric: She has a normal mood and affect.    LABS:  BMET  Recent Labs Lab 03/08/17 0509 03/08/17 1545 03/09/17 0457  NA 135  136 136 135  K 4.3  4.3 4.2 4.3  CL 101  102 102 102  CO2 27  28 27 26   BUN 14  14 13 15   CREATININE 1.11*  1.10* 1.24* 1.47*  GLUCOSE 103*  107* 120* 145*   Electrolytes  Recent Labs Lab 03/07/17 0427  03/08/17 0509 03/08/17 1545 03/09/17 0457  CALCIUM 8.2*  < > 8.6*  8.6* 9.0 9.1  MG 2.3  --  2.4  --  2.3  PHOS 2.1*  < > 2.7  2.8 2.8 3.6  < > = values in this interval not displayed. CBC  Recent Labs Lab 03/07/17 0427 03/08/17 0509 03/09/17 0457  WBC 21.3* 20.7* 25.4*  HGB 8.0* 7.9* 8.1*  HCT 26.9* 27.3* 26.8*  PLT 226 263 263   Coag's  Recent Labs Lab 03/08/17  1614 03/08/17 2145 03/09/17 0457  APTT 188* 67* 47*   Sepsis Markers  Recent Labs Lab 03/03/17 0407  PROCALCITON 0.40   ABG  Recent Labs Lab 03/07/17 0500  PHART 7.463*  PCO2ART 39.7  PO2ART 61.9*   Liver Enzymes  Recent Labs Lab 03/08/17 0509 03/08/17 1545 03/09/17 0457  ALBUMIN 2.2* 2.4* 2.4*   Cardiac Enzymes No results for input(s): TROPONINI, PROBNP in the last 168 hours.  Glucose  Recent Labs Lab 03/08/17 0404 03/08/17 0803 03/08/17 1623 03/08/17 2340 03/09/17 0453 03/09/17 0808  GLUCAP 107* 96 128* 149* 135* 144*   Imaging Ct Head Wo Contrast  Result Date: 03/09/2017 CLINICAL DATA:  Altered mental status. History of subdural hematoma and severe anemia. EXAM: CT HEAD WITHOUT CONTRAST TECHNIQUE: Contiguous axial images were obtained from the base of the skull through the vertex without intravenous contrast.  COMPARISON:  CT HEAD March 08, 2017 at 1446 hours FINDINGS: BRAIN: Enlarging LEFT holo hemispheric heterogeneous cyst subdural collection, previously 15 mm in thickness within LEFT middle cranial fossa, now 19 mm. Hematocrit level within LEFT anterior cranial fossa. 8 mm LEFT-to-RIGHT midline shift increased from 6 mm. Mass effect on LEFT lateral ventricle without hydrocephalus or entrapment. Stable 3-4 mm bilateral falcotentorial subdural hematomas. 8 mm RIGHT parietal subdural hematoma was 5 mm. Similar to slightly worsening LEFT uncal herniation with mildly effaced basal cisterns. No intraparenchymal hemorrhage or acute large vascular territory infarct. VASCULAR: Unremarkable. SKULL/SOFT TISSUES: No skull fracture. No significant soft tissue swelling. ORBITS/SINUSES: The included ocular globes and orbital contents are normal.The mastoid aircells and included paranasal sinuses are well-aerated. OTHER: None. IMPRESSION: Enlarging LEFT greater than RIGHT mixed density subdural hematomas. Relatively stable mixed density falcotentorial subdural hematomas. Increased 8 mm LEFT-to-RIGHT midline shift without ventricular entrapment. Worsening LEFT uncal herniation. Acute findings discussed with and reconfirmed by Dr.CHARLES STEWART on 03/09/2017 at 4:08 am. Electronically Signed   By: Elon Alas M.D.   On: 03/09/2017 04:09   Ct Head Wo Contrast  Addendum Date: 03/08/2017   ADDENDUM REPORT: 03/08/2017 15:36 ADDENDUM: Critical Value/emergent results were called by telephone at the time of interpretation on 03/08/2017 at 3:25 pm to Dr. Tamala Julian , who verbally acknowledged these results. Electronically Signed   By: Staci Righter M.D.   On: 03/08/2017 15:36   Result Date: 03/08/2017 CLINICAL DATA:  Nausea and vomiting. Severe headache for 2 days. Patient has been on anticoagulation. Severe anemia. EXAM: CT HEAD WITHOUT CONTRAST TECHNIQUE: Contiguous axial images were obtained from the base of the skull through the  vertex without intravenous contrast. COMPARISON:  None. FINDINGS: Brain: Large acute subdural hematoma on the LEFT, with the largest collection subtemporal. This is up to 15 mm thick. Extra-axial hematoma extends over the convexity, from the frontal, through the parietal and occipital regions. The collection extends along the tentorium posteriorly on the LEFT, and/or lesser degree on the RIGHT. Some areas are hyperdense and others are hypodense. Mixed attenuation could represent cell/serum separation in this patient with severe anemia. Much smaller collection of subdural fluid on the RIGHT, 3 mm as seen in the frontal interhemispheric fissure, but hyperdense along the RIGHT tentorium, up to 5 mm thick. There is LEFT-to-RIGHT shift of 7 mm as measured at the septum pellucidum. Early uncal herniation is also present, with effacement of the suprasellar cistern is slight brainstem rotation. Normal cerebral volume. No white matter disease. Gray-white junction is preserved throughout. No intra-axial hemorrhage, mass lesion, or visible acute stroke. Vascular: No hyperdense vessel or unexpected calcification.  Skull: Normal. Negative for fracture or focal lesion. Sinuses/Orbits: No acute finding. Other: None. IMPRESSION: BILATERAL subdural collections, up to 15 mm thick, greatest in the LEFT middle cranial fossa. In this patient with severe headaches and history of anticoagulation, the findings are consistent with spontaneous LEFT much greater than RIGHT BILATERAL acute subdural hematomas. Up to 7 mm LEFT-to-RIGHT shift is measured at the septum pellucidum. Early uncal herniation is observed. Neurosurgical consultation is warranted. Mixed attenuation of the subdural collections, some areas hyperdense and others hypodense, likely represents cell/serum separation in this patient with severe anemia. A call has been placed to the ordering provider. Electronically Signed: By: Staci Righter M.D. On: 03/08/2017 15:05   Dg Chest  Port 1 View  Result Date: 03/09/2017 CLINICAL DATA:  Acute respiratory failure. EXAM: PORTABLE CHEST 1 VIEW COMPARISON:  03/07/2017. FINDINGS: Marked enlargement of cardiac silhouette probably not changed. Support tubes and apparatus stable. Central venous catheter tip remains within the azygos vein and should be repositioned. Mild vascular congestion without frank edema or consolidation. IMPRESSION: Center venous catheter tip remains within the azygos vein and should be repositioned. No change aeration or enlarged cardiomediastinal silhouette. Electronically Signed   By: Staci Righter M.D.   On: 03/09/2017 08:11   STUDIES:  XR right Knee 6/24 >> No Acute CT abd/pelvis 6/25 >> anasarca with b/l pleural effusions, ascities. Fibroid uterus. Cardiomegaly with severe R atrial dilation. TTE 6/25 >> EF30-35%, diffuse hypokinesis, LA dilated, mild MR, RA mod dilated with signs of significant cor pulmonale. LE venous doppler 6/25 >> no DVT or SVT b/l US Paracentesis 6/26 >> 2.5 L peritoneal fluid yielded, cytology negative EGD 6/28 >> intense distal esophagitis with focal hemorrhagic hemorrhage, small hiatal hernia CXR 7/5 >> Cardiomegaly with vascular congestion, R midlung atelectasis with LLL opacity. CT head 7/14: spontaneous LEFT much greater than RIGHT BILATERAL acute subdural Hematomas. Up to 7 mm LEFT-to-RIGHT shift is measured at the septum pellucidum. Early uncal herniation is observed CT head 7/15: Enlarging LEFT greater than RIGHT mixed density subdural hematomas. Relatively stable mixed density falcotentorial subdural hematomas. Increased 8 mm LEFT-to-RIGHT midline shift without ventricular entrapment. Worsening LEFT uncal herniation.  CULTURES: MRSA PCR neg 6/24 Blood cultures 6/24 > 1 of 2 coag neg staph, likely contaminate Peritoneal fluid culture 6/26 > No growth 5 days, neg for AFB HIV 6/29 > negative  ANTIBIOTICS: Vancomycin 6/24 > 6/28 Rocephin 6/25 > 7/2 Keflex 7/2 >  7/4 Rocephin 7/6 >>7/12  SIGNIFICANT EVENTS: 6/24  Presents to [facility], admitted to ICU  6/29  Off vasopressors, transferred out to SDU 7/02  Lasix gtt initiated 7/04  Transferred to [facility]. Milrinone started 7/05  Dopamine started, transfer to ICU 7/06  Diffuse pain, requires pressor support with dopamine and a lasix drip.  Nausea.  CVVHD initiated.  7/07  Removed 2.3L in last 24 hours, remains positive balance.    LINES/TUBES: L IJ CVL 6/26 >> Foley 6/24 >> R IJ HD 7/6 >>   DISCUSSION: 56 y.o. female who presented after a fall at home found to be in hemorrhagic/hypovolemic with severe anemia and cardiogenic shock due to biventricular heart failure. Now with worsening anasarca and progressive renal failure with oliguria and hypotension.  CVVHD initiated 7/6.   Now w/ spont SDH w/ evidence of herniation but neuro-surg feels not clinically a concern w/ her current neuro exam  -I do not see this ending well for Ms Panchal. I do not think that she is able to make complex medical decisions, and  she says she does not have family around. I suspect that she will arrest if this continues. Over all prognosis for this is terrible. Will ask SW to see if we can get any one to speak on her behalf. May end up needing ethics consult  -there is some concern about placement of her central line in the azygous  Vein. Will hold off on addressing this as would like to see her level out.   ASSESSMENT / PLAN:  NEUROLOGIC A:   Severe Deconditioning  Acute bilateral SDH L>R. Left to right shift w/ worsening uncal herniation.  Felt d/t heparin during CRRT per n-surg note. This is not common.  Being followed by n-surg  P:   NPO Close neuro checks keppra increased  High risk for worsening  n-surg following and recommending conservative rx  PULMONARY A: Suspected OHS/OSA - intolerant of CPAP while inpatient (may have been related to fluid status) Increasing aspiration risk  pcxr personally reviewed. No sig change   P:   Wean oxygen Pulse ox NPO for aspiration risk High risk for intubation    CARDIOVASCULAR A:  Acute systolic heart failure with RV overload/cor pulmonale and massive anasarca Afib with RVR, improved Cardiogenic shock  P:  Cont rx per HF team CRRT per renal Cont tele   RENAL A:   Acute Kidney Injury/ cardiorenal syndrome  Oliguria P:  CRRT per renal F/u PRN chem   GASTROINTESTINAL A:   Upper GI bleed on EGD Severe protein-calorie malnutrition with kwashiokor Hepatic steatosis Ascites > cytology negative Peritoneal mets vs uterine fibroids P:   Cont TPA NPO for asp risk PPI   HEMATOLOGIC A:   Anemia, likely multifactorial from GI bleed and chronic malnutrition hgb holding  P:  Trend cbc SCDs No a/c  INFECTIOUS A:   Skin breakdown/wounds Cellulitis RLE Stage II Decubitus to Bilateral Buttock, RLE UTI  Off abx. No sig fever or change in leukocytosis  P:   Cont wound care  Trend fever and WBC curve  ENDOCRINE A:   Mild hyperglycemia Hypothyroid P:   Repeat TSH 8/1 Cont synthroid ssi as indicated    FAMILY  - Updates: Patient updated bedside   Erick Colace ACNP-BC [facility] Pager # [phone] OR # [phone] if no answer   03/09/2017, 8:40 AM

## 2017-03-09 NOTE — Progress Notes (Signed)
[facility] Progress Note Patient Name: Samantha Richards DOB: 04/25/1961 MRN: [id number]   Date of Service  03/09/2017  HPI/Events of Note  Subdural hematomas noted. Patient continuing to have headache. Pain reportedly stable and relieved with every 2 hours fentanyl.   eICU Interventions  Continuing close monitoring. May need to repeat CT head if pain level increased or mental status changes.      Intervention Category Major Interventions: Other:  Tera Partridge 03/09/2017, 11:34 PM

## 2017-03-09 NOTE — Progress Notes (Signed)
Advanced Heart Failure Rounding Note  PCP:  Primary Cardiologist: Dr Acie Fredrickson   Subjective:    02/26/17 started on milrinone and placed on lasix drip.   Evening of 02/27/17 pts pressures fell requiring more dopamine, became less responsive and UOP worsening. Pt transferred to [place].   CVVHD started 7/6   Yesterday developed severe HA. CT scan notable for SDH with mass effect. Heparin stopped. Seen by [facility] who recommended conservative management.  This am developed some aphasia and had repeat CT which showed mild increase in midline shift.   She is a bit sleepy this am due to Fentanyl, But full oriented. No obvious aphasia or weakness. HA resolved. Remains on CVVHD without heparin. Continues on milrinone 0.125 & dopamine 7. Todays CO-OX is 87%. Weight down another 13 pounds. 65 pounds total  Feels weak. Somewhat nauseated but no dyspnea.  Remains in atrial flutter - rates 90-100 on amio   TPN started this am  Objective:   Weight Range: 84.4 kg (186 lb 1.1 oz) Body mass index is 30.96 kg/m.   Vital Signs:   Temp:  [97.4 F (36.3 C)-98 F (36.7 C)] 97.6 F (36.4 C) (07/15 0700) Pulse Rate:  [81-102] 81 (07/15 1100) Resp:  [12-24] 22 (07/15 1100) BP: (94-166)/(57-131) 124/83 (07/15 1100) SpO2:  [93 %-100 %] 97 % (07/15 1100) Weight:  [84.4 kg (186 lb 1.1 oz)] 84.4 kg (186 lb 1.1 oz) (07/15 0457) Last BM Date: 03/07/17  Weight change: Filed Weights   03/07/17 0700 03/08/17 0455 03/09/17 0457  Weight: 94 kg (207 lb 3.7 oz) 90.6 kg (199 lb 11.8 oz) 84.4 kg (186 lb 1.1 oz)    Intake/Output:   Intake/Output Summary (Last 24 hours) at 03/09/17 1154 Last data filed at 03/09/17 1100  Gross per 24 hour  Intake          1714.57 ml  Output             1350 ml  Net           364.57 ml     Physical Exam   CVP 13 General:  Lying in bed. Chronically ill appearing. NAD HEENT: normal Neck: supple. RIJ trialysis. LIJ TLC  JVP jaw  No lymphadenopathy or thryomegaly appreciated.    Cor: PMI nonpalpable. RRR 2/6 TR Lungs: clear anteriorly Abdomen:obese soft NT/ND No hepatosplenomegaly. No bruits or masses. Good bowel sounds. Extremities: no cyanosis, clubbing, rash, 2-3+ edema with some sloughing skin Neuro: alert & oriented x 3, cranial nerves grossly intact. moves all 4 extremities w/o difficulty. Affect pleasant Skin: Partial thickness wounds on buttocks.   Telemetry   A flutter 90s. Personally reviewed.    EKG   N/A  Labs    CBC  Recent Labs  03/08/17 0509 03/09/17 0457  WBC 20.7* 25.4*  HGB 7.9* 8.1*  HCT 27.3* 26.8*  MCV 80.1 78.4  PLT 263 532   Basic Metabolic Panel  Recent Labs  03/08/17 0509 03/08/17 1545 03/09/17 0457  NA 135  136 136 135  K 4.3  4.3 4.2 4.3  CL 101  102 102 102  CO2 27  28 27 26   GLUCOSE 103*  107* 120* 145*  BUN 14  14 13 15   CREATININE 1.11*  1.10* 1.24* 1.47*  CALCIUM 8.6*  8.6* 9.0 9.1  MG 2.4  --  2.3  PHOS 2.7  2.8 2.8 3.6   Liver Function Tests  Recent Labs  03/08/17 1545 03/09/17 0457  ALBUMIN 2.4* 2.4*  BNP (last 3 results)  Recent Labs  02/16/17 1516  BNP 923.6*       Imaging   Transthoracic Echocardiography Study Conclusions  - Left ventricle: Septal flattening consistant with elevated RV   pressures. Systolic function was moderately to severely reduced.   The estimated ejection fraction was in the range of 30% to 35%.   Diffuse hypokinesis. - Mitral valve: There was mild regurgitation. - Left atrium: The atrium was mildly dilated. - Right ventricle: The cavity size was moderately dilated. - Right atrium: The atrium was moderately dilated. - Atrial septum: No defect or patent foramen ovale was identified. - Tricuspid valve: There was moderate-severe regurgitation. - Pericardium, extracardiac: A trivial pericardial effusion was   identified. - Impressions: Suspect PA pressure underestimated by TR velocity   due to RV dysfunction. There is moderate RV  enlargement with   severe hypokinesis and signs of significant cor pulmonale.  Impressions:  - Suspect PA pressure underestimated by TR velocity due to RV   dysfunction. There is moderate RV enlargement with severe   hypokinesis and signs of significant cor pulmonale.     Medications:     Scheduled Medications: . Chlorhexidine Gluconate Cloth  6 each Topical Daily  . feeding supplement (ENSURE ENLIVE)  237 mL Oral BID BM  . feeding supplement (PRO-STAT SUGAR FREE 64)  60 mL Oral BID  . fluticasone  2 spray Each Nare Daily  . insulin aspart  0-15 Units Subcutaneous Q4H  . levothyroxine  100 mcg Oral Daily  . multivitamin with minerals  1 tablet Oral Daily  . pantoprazole  40 mg Oral BID AC  . phosphorus  500 mg Oral TID  . potassium chloride  20 mEq Oral BID  . senna-docusate  1 tablet Oral BID  . sodium chloride flush  10-40 mL Intracatheter Q12H  . thiamine  100 mg Oral Daily  . traMADol  50 mg Oral Q12H    Infusions: . Marland KitchenTPN (CLINIMIX-E) Adult 40 mL/hr at 03/09/17 1100  . sodium chloride Stopped (03/09/17 0200)  . amiodarone 30 mg/hr (03/09/17 1100)  . DOPamine 7 mcg/kg/min (03/09/17 1100)  . levETIRAcetam Stopped (03/09/17 1035)  . milrinone 0.125 mcg/kg/min (03/09/17 1100)  . dialysis replacement fluid (prismasate) 500 mL/hr at 03/09/17 0900  . dialysis replacement fluid (prismasate) 300 mL/hr at 03/09/17 0900  . dialysate (PRISMASATE) 1,000 mL/hr at 03/09/17 0900  . TPN (CLINIMIX) Adult without lytes      PRN Medications: sodium chloride, acetaminophen (TYLENOL) oral liquid 160 mg/5 mL, acetaminophen, alum & mag hydroxide-simeth, camphor-menthol, fentaNYL (SUBLIMAZE) injection, guaiFENesin-dextromethorphan, hydrOXYzine, iopamidol, lip balm, menthol-cetylpyridinium, ondansetron (ZOFRAN) IV, phenol, promethazine, promethazine, sodium chloride flush    Patient Profile  56 y/o woman with morbid obesity and little previous medical follow-up. Admitted 6/24 with  severe anemia (hgb 3.7) and cardiogenic shock due biventricular HF R>L. Echo reviewed EF 30-35% with severe RV Failure and PAH. Has anasarca and progressive renal failure with inability to mobilize fluid. Transferred to [facility] on 7/4 for further evaluation by CHF team.    Assessment/Plan   1. 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  Low volts on ECG concerning for amyloid. Creatinine too high for CMRI. Consider TPY scan when more stable.  - Todays CO-OX 87%. I am going to stop milrinone. Continue dopamine. Can  wean as BP tolerated - CVP 13.  Continue CVVHD. Pulling 175-200/hr. Weight down 65 pounds total and probably another 25-30 pounds to go . Appreciate renal input - No beta blocker with acute decompensation.  - Eventually will need further w/u for biventricular CM if/when she is improved. Including VQ, possible L/R heart cath and sleep study - Suspect severe malnutrition may be key factor in her entire presentation.  2. AKI  - Remains on CVVHD. Now down 65 pounds. Continue volume removal with inotropes for pressure support. No heparin - Appreciate Renal input  4. Intracranial hemorrhage with SDH - developed on 7/13 - Heparin stopped. CTs reviewed personally. ? Some mild progression overnight radiographically - Exam stable - I spoke with [facility] personally and will continue conservative management given lack of neuro symptoms and location of bleed (which leeds to upward pressure on brain and lower risk of herniation) - All heparin on hold - Continue frequent neuro checks  5. Iron deficiency anemia - Hgb on admit was 3.7. With low MCV. Iron stores low. - EGD 02/20/17 with severe esophagitis  - Received feraheme 02/26/2017.  - Hgb stable at 8.1. Off all heparin due to SDH - Will need colonoscopy when  more stable.   6. Atrial flutter - Rates now controlled. Continue amio  - Off heparin due to SDH so not candidate for  DC-CV or possible ablation  7. Anasarca/ascites in setting of severe protein calorie malnutition - CT abdomen with hepatic stenosis.  - Likely due to combination of RHF and fatty liver. Albumin 2.7 02/18/17 - prealbumin was < 5 on 02/18/17. - Failed tube feeds. Intake remains poor. TPN started 7/14  8. Leukocytosis - Persistently elevated around 20-25K.Marland Kitchen Has completed rocephin for UTI  - No obvious sites of infection.  PCT only 1.44 02/27/17.  - Seen by Dr. Beryle Beams on 7/9. Smear ok. No evidence of toxic process or malignancy at this point.  - Can proceed with further workup as needed  9. Severe fibroid uterus  10. Morbid obesity - Nutrition following .    11. RLE wound - WOC has seen. Wounds improving   12. Probable OSA/OHS - Bipap as needed.   13-MASD (Moisture Associated Skin Damage) Buttock Wounds- Partial thickness- dose not appear infected. Appears to be related to moisture and fecal incontinence.  Prealbumin <5.  Wound Care appreciated.   CRITICAL CARE Performed by: Glori Bickers  Total critical care time: 45 minutes  Critical care time was exclusive of separately billable procedures and treating other patients.  Critical care was necessary to treat or prevent imminent or life-threatening deterioration.  Critical care was time spent personally by me (independent of midlevel providers or residents) on the following activities: development of treatment plan with patient and/or surrogate as well as nursing, discussions with consultants, evaluation of patient's response to treatment, examination of patient, obtaining history from patient or surrogate, ordering and performing treatments and interventions, ordering and review of laboratory studies, ordering and review of radiographic studies, pulse oximetry and re-evaluation of patient's  condition.     Length of Stay: 21  Glori Bickers, MD  03/09/2017, 11:54 AM  Advanced Heart Failure Team Pager [phone] (M-F; 7a - 4p)  Please contact [facility] for night-coverage after hours (4p -7a ) and weekends on [email or web address]

## 2017-03-09 NOTE — Progress Notes (Signed)
Mitts placed on patient hands d/t scratching off cvl dressings, pt currently removing mitts and refuses to keep them on even if that will keep her from accidentally removing the dressing, patient educated but still insists to have them off, will continue to monitor.   Kathleen Argue S 4:30 PM

## 2017-03-09 NOTE — Progress Notes (Signed)
Patient's "friend" is at bedside asking questions regarding the patient. The patient does not give the nurse permission to discuss her health with this Friend. This nurse explains to the friend that I can not answer his questions because of the privacy laws. The friend becomes very confrontational and the nurse has to be stern with the policies of the facility. The patient voices understanding and the Friend decides to stay at the bedside at this time.

## 2017-03-10 DIAGNOSIS — J9601 Acute respiratory failure with hypoxia: Secondary | ICD-10-CM

## 2017-03-10 DIAGNOSIS — I611 Nontraumatic intracerebral hemorrhage in hemisphere, cortical: Secondary | ICD-10-CM

## 2017-03-10 DIAGNOSIS — J810 Acute pulmonary edema: Secondary | ICD-10-CM

## 2017-03-10 DIAGNOSIS — M25569 Pain in unspecified knee: Secondary | ICD-10-CM

## 2017-03-10 LAB — GLUCOSE, CAPILLARY
GLUCOSE-CAPILLARY: 96 mg/dL (ref 65–99)
GLUCOSE-CAPILLARY: 133 mg/dL — AB (ref 65–99)
GLUCOSE-CAPILLARY: 138 mg/dL — AB (ref 65–99)
Glucose-Capillary: 101 mg/dL — ABNORMAL HIGH (ref 65–99)
Glucose-Capillary: 145 mg/dL — ABNORMAL HIGH (ref 65–99)
Glucose-Capillary: 157 mg/dL — ABNORMAL HIGH (ref 65–99)
Glucose-Capillary: 166 mg/dL — ABNORMAL HIGH (ref 65–99)
Glucose-Capillary: 152 mg/dL — ABNORMAL HIGH (ref 65–99)

## 2017-03-10 LAB — DIFFERENTIAL
BASOS ABS: 0 10*3/uL (ref 0.0–0.1)
Basophils Relative: 0 %
EOS PCT: 0 %
Eosinophils Absolute: 0 10*3/uL (ref 0.0–0.7)
LYMPHS ABS: 1 10*3/uL (ref 0.7–4.0)
Lymphocytes Relative: 3 %
MONOS PCT: 6 %
Monocytes Absolute: 1.9 10*3/uL — ABNORMAL HIGH (ref 0.1–1.0)
NEUTROS ABS: 29.4 10*3/uL — AB (ref 1.7–7.7)
Neutrophils Relative %: 91 %

## 2017-03-10 LAB — TRIGLYCERIDES: Triglycerides: 52 mg/dL (ref <150)

## 2017-03-10 LAB — COMPREHENSIVE METABOLIC PANEL
ALT: 14 U/L (ref 14–54)
AST: 16 U/L (ref 15–41)
Albumin: 2.5 g/dL — ABNORMAL LOW (ref 3.5–5.0)
Alkaline Phosphatase: 101 U/L (ref 38–126)
Anion gap: 6 (ref 5–15)
BILIRUBIN TOTAL: 1 mg/dL (ref 0.3–1.2)
BUN: 16 mg/dL (ref 6–20)
CO2: 27 mmol/L (ref 22–32)
CREATININE: 1.21 mg/dL — AB (ref 0.44–1.00)
Calcium: 9 mg/dL (ref 8.9–10.3)
Chloride: 100 mmol/L — ABNORMAL LOW (ref 101–111)
GFR, EST AFRICAN AMERICAN: 58 mL/min — AB (ref >60)
GFR, EST NON AFRICAN AMERICAN: 50 mL/min — AB (ref >60)
Glucose, Bld: 153 mg/dL — ABNORMAL HIGH (ref 65–99)
POTASSIUM: 4.1 mmol/L (ref 3.5–5.1)
Sodium: 133 mmol/L — ABNORMAL LOW (ref 135–145)
TOTAL PROTEIN: 6.1 g/dL — AB (ref 6.5–8.1)

## 2017-03-10 LAB — CBC
HEMATOCRIT: 26.7 % — AB (ref 36.0–46.0)
Hemoglobin: 7.9 g/dL — ABNORMAL LOW (ref 12.0–15.0)
MCH: 23.4 pg — AB (ref 26.0–34.0)
MCHC: 29.6 g/dL — ABNORMAL LOW (ref 30.0–36.0)
MCV: 79.2 fL (ref 78.0–100.0)
Platelets: 240 10*3/uL (ref 150–400)
RBC: 3.37 MIL/uL — ABNORMAL LOW (ref 3.87–5.11)
RDW: 32.8 % — AB (ref 11.5–15.5)
WBC: 32.3 10*3/uL — AB (ref 4.0–10.5)

## 2017-03-10 LAB — PHOSPHORUS: Phosphorus: 2.3 mg/dL — ABNORMAL LOW (ref 2.5–4.6)

## 2017-03-10 LAB — RENAL FUNCTION PANEL
Albumin: 2.1 g/dL — ABNORMAL LOW (ref 3.5–5.0)
Anion gap: 6 (ref 5–15)
BUN: 15 mg/dL (ref 6–20)
CHLORIDE: 105 mmol/L (ref 101–111)
CO2: 25 mmol/L (ref 22–32)
Calcium: 8.3 mg/dL — ABNORMAL LOW (ref 8.9–10.3)
Creatinine, Ser: 0.97 mg/dL (ref 0.44–1.00)
Glucose, Bld: 126 mg/dL — ABNORMAL HIGH (ref 65–99)
POTASSIUM: 3.7 mmol/L (ref 3.5–5.1)
Phosphorus: 2.3 mg/dL — ABNORMAL LOW (ref 2.5–4.6)
Sodium: 136 mmol/L (ref 135–145)

## 2017-03-10 LAB — COOXEMETRY PANEL
Carboxyhemoglobin: 1.9 % — ABNORMAL HIGH (ref 0.5–1.5)
Methemoglobin: 0.9 % (ref 0.0–1.5)
O2 SAT: 60.2 %
TOTAL HEMOGLOBIN: 6.9 g/dL — AB (ref 12.0–16.0)

## 2017-03-10 LAB — MAGNESIUM: MAGNESIUM: 2.3 mg/dL (ref 1.7–2.4)

## 2017-03-10 LAB — APTT: aPTT: 48 seconds — ABNORMAL HIGH (ref 24–36)

## 2017-03-10 LAB — PREALBUMIN: Prealbumin: 5.6 mg/dL — ABNORMAL LOW (ref 18–38)

## 2017-03-10 MED ORDER — FENTANYL 25 MCG/HR TD PT72
50.0000 ug | MEDICATED_PATCH | TRANSDERMAL | Status: DC
  Administered 2017-03-10: 50 ug via TRANSDERMAL
  Filled 2017-03-10: qty 2

## 2017-03-10 MED ORDER — THIAMINE HCL 100 MG/ML IJ SOLN
INTRAVENOUS | Status: AC
  Administered 2017-03-10: 18:00:00 via INTRAVENOUS
  Filled 2017-03-10: qty 2640

## 2017-03-10 MED ORDER — HYDROMORPHONE HCL 1 MG/ML IJ SOLN
0.5000 mg | INTRAMUSCULAR | Status: DC | PRN
  Administered 2017-03-10 – 2017-03-13 (×13): 0.5 mg via INTRAVENOUS
  Filled 2017-03-10 (×13): qty 0.5

## 2017-03-10 MED ORDER — SODIUM PHOSPHATES 45 MMOLE/15ML IV SOLN
15.0000 mmol | Freq: Once | INTRAVENOUS | Status: AC
  Administered 2017-03-10: 15 mmol via INTRAVENOUS
  Filled 2017-03-10: qty 5

## 2017-03-10 MED ORDER — LEVOTHYROXINE SODIUM 100 MCG IV SOLR
50.0000 ug | Freq: Every day | INTRAVENOUS | Status: DC
  Administered 2017-03-10 – 2017-03-12 (×3): 50 ug via INTRAVENOUS
  Filled 2017-03-10 (×3): qty 5

## 2017-03-10 MED ORDER — PANTOPRAZOLE SODIUM 40 MG IV SOLR
40.0000 mg | Freq: Two times a day (BID) | INTRAVENOUS | Status: DC
  Administered 2017-03-10 – 2017-03-13 (×7): 40 mg via INTRAVENOUS
  Filled 2017-03-10 (×7): qty 40

## 2017-03-10 NOTE — Progress Notes (Signed)
Advanced Heart Failure Rounding Note  PCP:  Primary Cardiologist: Dr Acie Fredrickson   Subjective:    02/26/17 started on milrinone and placed on lasix drip.   Evening of 02/27/17 pts pressures fell requiring more dopamine, became less responsive and UOP worsening. Pt transferred to [place].   CVVHD started 7/6   On 7/14 developed severe HA. CT scan notable for SDH with mass effect. Heparin stopped. Seen by [facility] who recommended conservative management.  On 7/15 developed some aphasia and had repeat CT which showed mild increase in midline shift. Continue medical management  Had frequent HAs overnight but now improved. No focal deficits or aphasia  Legs hurt. Denies SOB. Severe leg cramps  Remains on CVVHD on TPN  Objective:   Weight Range: 82.6 kg (182 lb 1.6 oz) Body mass index is 30.3 kg/m.   Vital Signs:   Temp:  [96 F (35.6 C)-97.6 F (36.4 C)] 97.4 F (36.3 C) (07/16 0400) Pulse Rate:  [81-100] 93 (07/16 0500) Resp:  [13-31] 16 (07/16 0500) BP: (82-129)/(63-109) 98/86 (07/16 0500) SpO2:  [95 %-100 %] 100 % (07/16 0500) Weight:  [82.6 kg (182 lb 1.6 oz)] 82.6 kg (182 lb 1.6 oz) (07/16 0500) Last BM Date: 03/07/17  Weight change: Filed Weights   03/08/17 0455 03/09/17 0457 03/10/17 0500  Weight: 90.6 kg (199 lb 11.8 oz) 84.4 kg (186 lb 1.1 oz) 82.6 kg (182 lb 1.6 oz)    Intake/Output:   Intake/Output Summary (Last 24 hours) at 03/10/17 0544 Last data filed at 03/10/17 0500  Gross per 24 hour  Intake          2366.44 ml  Output             5472 ml  Net         -3105.56 ml     Physical Exam   CVP 15-18  General:  Chronically-ill appearing. No resp difficulty HEENT: normal Neck: supple. RIJ trialysis cath LIJ TLC. Carotids 2+ bilat; no bruits. No lymphadenopathy or thryomegaly appreciated. Cor: PMI nondisplaced. Tachy irreg Lungs: clear Abdomen: obese soft, nontender, nondistended. No hepatosplenomegaly. No bruits or masses. Good bowel sounds. Extremities: no  cyanosis, clubbing, rash, 2+ edema Neuro: alert & orientedx3, cranial nerves grossly intact. moves all 4 extremities w/o difficulty. Affect pleasant   Telemetry   A flutter 90s. Personally reviewed    EKG   N/A  Labs    CBC  Recent Labs  03/08/17 0509 03/09/17 0457  WBC 20.7* 25.4*  HGB 7.9* 8.1*  HCT 27.3* 26.8*  MCV 80.1 78.4  PLT 263 224   Basic Metabolic Panel  Recent Labs  03/08/17 0509  03/09/17 0457 03/09/17 1640  NA 135  136  < > 135 135  K 4.3  4.3  < > 4.3 4.1  CL 101  102  < > 102 102  CO2 27  28  < > 26 27  GLUCOSE 103*  107*  < > 145* 136*  BUN 14  14  < > 15 15  CREATININE 1.11*  1.10*  < > 1.47* 1.39*  CALCIUM 8.6*  8.6*  < > 9.1 9.3  MG 2.4  --  2.3  --   PHOS 2.7  2.8  < > 3.6 3.1  < > = values in this interval not displayed. Liver Function Tests  Recent Labs  03/09/17 0457 03/09/17 1640  ALBUMIN 2.4* 2.4*     BNP (last 3 results)  Recent Labs  02/16/17 1516  BNP 923.6*       Imaging   Transthoracic Echocardiography Study Conclusions  - Left ventricle: Septal flattening consistant with elevated RV   pressures. Systolic function was moderately to severely reduced.   The estimated ejection fraction was in the range of 30% to 35%.   Diffuse hypokinesis. - Mitral valve: There was mild regurgitation. - Left atrium: The atrium was mildly dilated. - Right ventricle: The cavity size was moderately dilated. - Right atrium: The atrium was moderately dilated. - Atrial septum: No defect or patent foramen ovale was identified. - Tricuspid valve: There was moderate-severe regurgitation. - Pericardium, extracardiac: A trivial pericardial effusion was   identified. - Impressions: Suspect PA pressure underestimated by TR velocity   due to RV dysfunction. There is moderate RV enlargement with   severe hypokinesis and signs of significant cor pulmonale.  Impressions:  - Suspect PA pressure underestimated by TR  velocity due to RV   dysfunction. There is moderate RV enlargement with severe   hypokinesis and signs of significant cor pulmonale.     Medications:     Scheduled Medications: . chlorhexidine  15 mL Mouth Rinse BID  . Chlorhexidine Gluconate Cloth  6 each Topical Daily  . feeding supplement (ENSURE ENLIVE)  237 mL Oral BID BM  . feeding supplement (PRO-STAT SUGAR FREE 64)  60 mL Oral BID  . fluticasone  2 spray Each Nare Daily  . insulin aspart  0-15 Units Subcutaneous Q4H  . levothyroxine  100 mcg Oral Daily  . mouth rinse  15 mL Mouth Rinse q12n4p  . multivitamin with minerals  1 tablet Oral Daily  . pantoprazole  40 mg Oral BID AC  . phosphorus  500 mg Oral TID  . potassium chloride  20 mEq Oral BID  . senna-docusate  1 tablet Oral BID  . sodium chloride flush  10-40 mL Intracatheter Q12H  . thiamine  100 mg Oral Daily  . traMADol  50 mg Oral Q12H    Infusions: . sodium chloride 250 mL (03/09/17 2012)  . amiodarone 30 mg/hr (03/09/17 2000)  . DOPamine 2 mcg/kg/min (03/10/17 0345)  . levETIRAcetam Stopped (03/09/17 2159)  . dialysis replacement fluid (prismasate) 500 mL/hr at 03/09/17 1904  . dialysis replacement fluid (prismasate) 300 mL/hr at 03/10/17 0155  . dialysate (PRISMASATE) 1,000 mL/hr at 03/10/17 0005  . TPN (CLINIMIX) Adult without lytes 83 mL/hr at 03/09/17 1900    PRN Medications: sodium chloride, acetaminophen (TYLENOL) oral liquid 160 mg/5 mL, acetaminophen, alum & mag hydroxide-simeth, camphor-menthol, fentaNYL (SUBLIMAZE) injection, guaiFENesin-dextromethorphan, hydrOXYzine, iopamidol, lip balm, menthol-cetylpyridinium, ondansetron (ZOFRAN) IV, phenol, promethazine, promethazine, sodium chloride flush    Patient Profile  56 y/o woman with morbid obesity and little previous medical follow-up. Admitted 6/24 with severe anemia (hgb 3.7) and cardiogenic shock due biventricular HF R>L. Echo reviewed EF 30-35% with severe RV Failure and PAH. Has anasarca  and progressive renal failure with inability to mobilize fluid. Transferred to [facility] on 7/4 for further evaluation by CHF team.    Assessment/Plan   1. 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  Low volts on ECG concerning for amyloid. Creatinine too high for CMRI. Consider TPY scan when more stable.  - Milrinone stopped yesterday. Remains on dopamine Todays CO-OX pending. Can wean dopamine as BP tolerated - CVP  15-18.  Continue CVVHD. Pulling 175-200/hr. Will likely have to slow down with leg cramps.  Weight down 69 pounds total and probably another 20 pounds to go . Appreciate renal input - No beta blocker with acute decompensation.  - Eventually will need further w/u for biventricular CM if/when she is improved. Including VQ, possible L/R heart cath and sleep study - Suspect severe malnutrition may be key factor in her entire presentation.  2. AKI  - Remains on CVVHD. Now down 69 pounds. Continue volume removal with inotropes for pressure support. No heparin - May need to sloww CVVHD with cramping - Appreciate Renal input  4. Intracranial hemorrhage with SDH - developed on 7/13 - Heparin stopped. CTs reviewed personally. ? Some mild progression overnight radiographically - Exam stable - I spoke with NSU personally yesterday and will continue conservative management given lack of neuro symptoms and location of bleed (which leeds to upward pressure on brain and lower risk of herniation) - All heparin on hold - Continue frequent neuro checks - Will repeat head CT today   5. Iron deficiency anemia - Hgb on admit was 3.7. With low MCV. Iron stores low. - EGD 02/20/17 with severe esophagitis  - Received feraheme 02/26/2017.  - Hgb stable at 8. Yesterday. Repeat today Off all heparin due to SDH - Will  need colonoscopy when more stable.   6. Atrial flutter - Rates now controlled. Continue amio  - Off heparin due to SDH so not candidate for  DC-CV or possible ablation  7. Anasarca/ascites in setting of severe protein calorie malnutition - CT abdomen with hepatic stenosis.  - Likely due to combination of RHF and fatty liver. Albumin 2.7 02/18/17 - prealbumin was < 5 on 02/18/17. - Failed tube feeds. Intake remains poor. TPN started 7/14  8. Leukocytosis - Persistently elevated around 20-25K.Marland Kitchen Has completed rocephin for UTI  - No obvious sites of infection.  PCT only 1.44 02/27/17.  - Seen by Dr. Beryle Beams on 7/9. Smear ok. No evidence of toxic process or malignancy at this point.  - Can proceed with further workup as needed  9. Severe fibroid uterus  10. Morbid obesity - Nutrition following .    11. RLE wound - WOC has seen. Wounds improving   12. Probable OSA/OHS - Bipap as needed.   13-MASD (Moisture Associated Skin Damage) Buttock Wounds- Partial thickness- dose not appear infected. Appears to be related to moisture and fecal incontinence.  Prealbumin <5.  Wound Care appreciated.   CRITICAL CARE Performed by: Glori Bickers  Total critical care time: 35 minutes  Critical care time was exclusive of separately billable procedures and treating other patients.  Critical care was necessary to treat or prevent imminent or life-threatening deterioration.  Critical care was time spent personally by me (independent of midlevel providers or residents) on the following activities: development of treatment plan with patient and/or surrogate as well as nursing, discussions with consultants, evaluation of patient's response to treatment, examination of patient, obtaining history from patient or surrogate, ordering and performing treatments and interventions, ordering and review of laboratory studies, ordering and review of radiographic studies, pulse oximetry and re-evaluation of  patient's condition.   Length of Stay: 22  Glori Bickers, MD  03/10/2017, 5:44 AM  Advanced Heart Failure Team Pager [phone] (M-F; 7a - 4p)  Please contact [facility] for night-coverage after hours (4p -7a ) and weekends on [email or web address]

## 2017-03-10 NOTE — Plan of Care (Signed)
Patient's temp 94.8 axillary, bair hugger on, return line CRRT on warmer, encouraged pt to keep covered instead of turning every 15 minutes and throwing off cover. Room temp increased as well, will continue to assess.

## 2017-03-10 NOTE — Plan of Care (Signed)
Problem: Safety: Goal: Ability to remain free from injury will improve Outcome: Not Progressing Pt continues to scratch at central line dressings despite multiple attempts to educate on infection risk.

## 2017-03-10 NOTE — Plan of Care (Signed)
Fentanyl 50 mcg patch removed and discarded in sharps witnessed by [employer]

## 2017-03-10 NOTE — Progress Notes (Signed)
eLink Physician-Brief Progress Note Patient Name: Samantha Richards DOB: Jan 09, 1961 MRN: [id number]   Date of Service  03/10/2017  HPI/Events of Note  Pain legs Failing patch fenrt Dc fent all forms Add dilauded lowest dose for now  eICU Interventions       Intervention Category Intermediate Interventions: Pain - evaluation and management  Raylene Miyamoto. 03/10/2017, 5:41 PM

## 2017-03-10 NOTE — Progress Notes (Signed)
PULMONARY / CRITICAL CARE MEDICINE   Name: Samantha Richards MRN: [id number] DOB: [birth year]    ADMISSION DATE:  02/16/2017 CONSULTATION DATE:  02/27/17  REFERRING MD:  Dr. Wynetta Emery  CHIEF COMPLAINT:  Hypotension  BRIEF SUMMARY:   Samantha Richards is a 56 y.o. female who presented to [facility] on 6/24 after a fall at home and was found to be in hemorrhagic/hypovolemic and cardiogenic shock. She was severely anemic to Hgb 3.7 in the setting of GI bleed. Requiring transfusions, pressors and BiPAP in ICU. She was also treated for cellulitis of the back. Levophed was weaned 6/28 and the patient was transferred to hospitalist service 6/29. Cardiology has been managing AFib with RVR with amiodarone gtt and biventricular heart failure R>L with lasix gtt. She had cardiogenic shock prompting transfer to [facility] on 7/4 for further evaluation by CHF team and initiation of milrinone and dopamine gtt. She also has anasarca and progressive renal failure with oliguria and hypotension.  Nephrology consulted, CVVHD initiated 7/6.    SUBJECTIVE:   No events overnight, continues to be on pressors, no new complaints  VITAL SIGNS: BP 100/81   Pulse 82   Temp (!) 97.4 F (36.3 C) (Axillary)   Resp 19   Ht 5\' 5"  (1.651 m)   Wt 82.6 kg (182 lb 1.6 oz)   LMP 01/16/2017 Comment: neg preg 02-16-2017  SpO2 100%   BMI 30.30 kg/m   HEMODYNAMICS: CVP:  [18 mmHg] 18 mmHg  VENTILATOR SETTINGS:    INTAKE / OUTPUT: I/O last 3 completed shifts: In: 6659 [P.O.:120; I.V.:3046.5; IV Piggyback:312.5] Out: 6167 [Urine:1; Emesis/NG output:250; Other:5916]  PHYSICAL EXAMINATION: General: Chronically ill appearing, NAD HEENT: [place]/AT, PERRL, EOM-I and MMM PSY: Calm and appropriate Neuro: Awake, lethargic, moving all available ext to command CV: RRR, Nl S1/S2, -M/R/G. PULM: Bibasilar crackles GI: Soft, NT, ND and +BS, NGT in place Extremities: warm/dry, anasarca  Skin: no rashes or lesions  LABS:  BMET  Recent  Labs Lab 03/09/17 0457 03/09/17 1640 03/10/17 0523  NA 135 135 133*  K 4.3 4.1 4.1  CL 102 102 100*  CO2 26 27 27   BUN 15 15 16   CREATININE 1.47* 1.39* 1.21*  GLUCOSE 145* 136* 153*   Electrolytes  Recent Labs Lab 03/08/17 0509  03/09/17 0457 03/09/17 1640 03/10/17 0523  CALCIUM 8.6*  8.6*  < > 9.1 9.3 9.0  MG 2.4  --  2.3  --  2.3  PHOS 2.7  2.8  < > 3.6 3.1 2.3*  < > = values in this interval not displayed. CBC  Recent Labs Lab 03/08/17 0509 03/09/17 0457 03/10/17 0523  WBC 20.7* 25.4* 32.3*  HGB 7.9* 8.1* 7.9*  HCT 27.3* 26.8* 26.7*  PLT 263 263 240   Coag's  Recent Labs Lab 03/08/17 2145 03/09/17 0457 03/10/17 0523  APTT 67* 47* 48*   Sepsis Markers No results for input(s): LATICACIDVEN, PROCALCITON, O2SATVEN in the last 168 hours. ABG  Recent Labs Lab 03/07/17 0500  PHART 7.463*  PCO2ART 39.7  PO2ART 61.9*   Liver Enzymes  Recent Labs Lab 03/09/17 0457 03/09/17 1640 03/10/17 0523  AST  --   --  16  ALT  --   --  14  ALKPHOS  --   --  101  BILITOT  --   --  1.0  ALBUMIN 2.4* 2.4* 2.5*   Cardiac Enzymes No results for input(s): TROPONINI, PROBNP in the last 168 hours.  Glucose  Recent Labs Lab 03/09/17 0808 03/09/17  1215 03/09/17 1604 03/09/17 1940 03/09/17 2018 03/09/17 2336  GLUCAP 144* 131* 130* 123* 147* 150*   Imaging No results found. STUDIES:  XR right Knee 6/24 >> No Acute CT abd/pelvis 6/25 >> anasarca with b/l pleural effusions, ascities. Fibroid uterus. Cardiomegaly with severe R atrial dilation. TTE 6/25 >> EF30-35%, diffuse hypokinesis, LA dilated, mild MR, RA mod dilated with signs of significant cor pulmonale. LE venous doppler 6/25 >> no DVT or SVT b/l US Paracentesis 6/26 >> 2.5 L peritoneal fluid yielded, cytology negative EGD 6/28 >> intense distal esophagitis with focal hemorrhagic hemorrhage, small hiatal hernia CXR 7/5 >> Cardiomegaly with vascular congestion, R midlung atelectasis with LLL  opacity.  CULTURES: MRSA PCR neg 6/24 Blood cultures 6/24 > 1 of 2 coag neg staph, likely contaminate Peritoneal fluid culture 6/26 > No growth 5 days, neg for AFB HIV 6/29 > negative  ANTIBIOTICS: Vancomycin 6/24 > 6/28 Rocephin 6/25 > 7/2 Keflex 7/2 > 7/4 Rocephin 7/6 >>7/12  SIGNIFICANT EVENTS: 6/24  Presents to [facility], admitted to ICU  6/29  Off vasopressors, transferred out to SDU 7/02  Lasix gtt initiated 7/04  Transferred to [facility]. Milrinone started 7/05  Dopamine started, transfer to ICU 7/06  Diffuse pain, requires pressor support with dopamine and a lasix drip.  Nausea.  CVVHD initiated.  7/07  Removed 2.3L in last 24 hours, remains positive balance.    LINES/TUBES: L IJ CVL 6/26 >> Foley 6/24 >> R IJ HD 7/6 >>   DISCUSSION: 56 y.o.femalewho presented after a fall at home found to be in hemorrhagic/hypovolemic with severe anemia and cardiogenic shock due to biventricular heart failure. Now with worsening anasarca and progressive renal failure with oliguria and hypotension.  CVVHD initiated 7/6.   Now w/ spont SDH w/ evidence of herniation but neuro-surg feels not clinically a concern w/ her current neuro exam  - I agree, I do not see this ending well for Samantha Richards. I do not think that she is able to make complex medical decisions, and she says she does not have family around. I suspect that she will arrest if this continues. Over all prognosis for this is terrible.  SW to find family for complex decision making.  If no success by AM will consider an ethics consult and a psych consult. - There is some concern about placement of her central line in the azygous  Vein. Will hold off on addressing this as would like to see her level out.   ASSESSMENT / PLAN:  NEUROLOGIC A:   Severe Deconditioning  Acute bilateral SDH L>R. Left to right shift w/ worsening uncal herniation.  Felt d/t heparin during CRRT per n-surg note. This is not common.  Being followed by n-surg  P:   -  NPO - Neuro checks - SLP - Keppra increased  - NS following and recommending conservative rx  PULMONARY A: Suspected OHS/OSA - intolerant of CPAP while inpatient (may have been related to fluid status) Increasing aspiration risk  pcxr personally reviewed. No sig change  P:   - Wean oxygen for sat of 88-92% - Pulse ox monitoring - NPO for aspiration risk - High risk for intubation    CARDIOVASCULAR A:  Acute systolic heart failure with RV overload/cor pulmonale and massive anasarca Afib with RVR, improved Cardiogenic shock  P:  - Cont rx per HF team - CRRT per renal negative 175 ml/hr. - Cont tele  - Dopamine weaning - Milrinone off - Amio drip  RENAL A:  Acute Kidney Injury/ cardiorenal syndrome  Oliguria P:  - CRRT per renal -175 ml/hr. - F/u PRN chem. - Replace electrolytes as indicated.  GASTROINTESTINAL A:   Upper GI bleed on EGD Severe protein-calorie malnutrition with kwashiokor Hepatic steatosis Ascites > cytology negative Peritoneal mets vs uterine fibroids P:   - Cont TPN - NPO for asp risk - PPI  - SLP  HEMATOLOGIC A:   Anemia, likely multifactorial from GI bleed and chronic malnutrition hgb holding  P:  - Trend CBC - SCDs - Hold all anticoagulation - Citrate in CRRT  INFECTIOUS A:   Skin breakdown/wounds Cellulitis RLE Stage II Decubitus to Bilateral Buttock, RLE UTI  Off abx. No sig fever or change in leukocytosis  P:   - Cont wound care  - Trend fever and WBC curve  ENDOCRINE A:   Mild hyperglycemia Hypothyroid P:   - Repeat TSH 8/1 - Cont synthroid - CBG and SSI  Neuro: Leg pain, fentanyl patch 50 mcg.  FAMILY  - Updates: Patient updated bedside.  RN to contact SW for a family for decision making if not then will need ethics consult.  The patient is critically ill with multiple organ systems failure and requires high complexity decision making for assessment and support, frequent evaluation and titration  of therapies, application of advanced monitoring technologies and extensive interpretation of multiple databases.   Critical Care Time devoted to patient care services described in this note is  35  Minutes. This time reflects time of care of this signee Dr Jennet Maduro. This critical care time does not reflect procedure time, or teaching time or supervisory time of PA/NP/Med student/Med Resident etc but could involve care discussion time.  Rush Farmer, M.D. [facility] Pulmonary/Critical Care Medicine. Pager: [phone]. After hours pager: [phone].  03/10/2017, 9:11 AM

## 2017-03-10 NOTE — Procedures (Signed)
Admit: 02/16/2017 LOS: 10  47F with anuric AKI llikely 2/2 ATN and Cardiorenal, hypervolemia, b/l systolic HF, recent severe GIB, b/l SDH   Current CRRT Prescription: Sart Date: 02/28/17 Catheter: R IJ TDC BFR: 200 Pre Blood Pump: 500 4K DFR: 1000 4K Replacement Rate: 300 4K Goal UF: net neg 123mL/hr Anticoagulation: none, hx/o SDH on heparin Clotting: ~q18h    S: Agitated this AM Tolerating UF, weight down Clotting 9am to 2am, no citrate On dopamine / amiodarone   O: 07/15 0701 - 07/16 0700 In: 2444.6 [I.V.:2234.6; IV Piggyback:210] Out: 6016 [Emesis/NG output:100]  Filed Weights   03/08/17 0455 03/09/17 0457 03/10/17 0500  Weight: 90.6 kg (199 lb 11.8 oz) 84.4 kg (186 lb 1.1 oz) 82.6 kg (182 lb 1.6 oz)     Recent Labs Lab 03/09/17 0457 03/09/17 1640 03/10/17 0523  NA 135 135 133*  K 4.3 4.1 4.1  CL 102 102 100*  CO2 26 27 27   GLUCOSE 145* 136* 153*  BUN 15 15 16   CREATININE 1.47* 1.39* 1.21*  CALCIUM 9.1 9.3 9.0  PHOS 3.6 3.1 2.3*    Recent Labs Lab 03/04/17 0417  03/08/17 0509 03/09/17 0457 03/10/17 0523  WBC 22.9*  < > 20.7* 25.4* 32.3*  NEUTROABS 19.3*  --   --   --  29.4*  HGB 8.4*  < > 7.9* 8.1* 7.9*  HCT 28.5*  < > 27.3* 26.8* 26.7*  MCV 78.5  < > 80.1 78.4 79.2  PLT 272  < > 263 263 240  < > = values in this interval not displayed.  Scheduled Meds: . chlorhexidine  15 mL Mouth Rinse BID  . Chlorhexidine Gluconate Cloth  6 each Topical Daily  . feeding supplement (ENSURE ENLIVE)  237 mL Oral BID BM  . feeding supplement (PRO-STAT SUGAR FREE 64)  60 mL Oral BID  . fluticasone  2 spray Each Nare Daily  . insulin aspart  0-15 Units Subcutaneous Q4H  . levothyroxine  100 mcg Oral Daily  . mouth rinse  15 mL Mouth Rinse q12n4p  . pantoprazole  40 mg Oral BID AC  . senna-docusate  1 tablet Oral BID  . sodium chloride flush  10-40 mL Intracatheter Q12H  . traMADol  50 mg Oral Q12H   Continuous Infusions: . sodium chloride 250 mL (03/10/17  0800)  . amiodarone 30 mg/hr (03/10/17 0800)  . DOPamine 1 mcg/kg/min (03/10/17 0076)  . levETIRAcetam Stopped (03/09/17 2159)  . dialysis replacement fluid (prismasate) 500 mL/hr at 03/10/17 0554  . dialysis replacement fluid (prismasate) 300 mL/hr at 03/10/17 0155  . dialysate (PRISMASATE) 1,000 mL/hr at 03/10/17 0554  . sodium phosphate  Dextrose 5% IVPB 15 mmol (03/10/17 0811)  . TPN (CLINIMIX) Adult without lytes 83 mL/hr at 03/10/17 0800  . TPN (CLINIMIX) Adult without lytes     PRN Meds:.sodium chloride, acetaminophen (TYLENOL) oral liquid 160 mg/5 mL, acetaminophen, alum & mag hydroxide-simeth, camphor-menthol, fentaNYL (SUBLIMAZE) injection, guaiFENesin-dextromethorphan, hydrOXYzine, iopamidol, lip balm, menthol-cetylpyridinium, ondansetron (ZOFRAN) IV, phenol, promethazine, promethazine, sodium chloride flush  ABG    Component Value Date/Time   PHART 7.463 (H) 03/07/2017 0500   PCO2ART 39.7 03/07/2017 0500   PO2ART 61.9 (L) 03/07/2017 0500   HCO3 28.0 03/07/2017 0500   ACIDBASEDEF 4.3 (H) 02/28/2017 0100   O2SAT 60.2 03/10/2017 0530    A/P  1. Dialysis dependent AKI, anuric,  Admission SCr 2; likely ATN/Cardiorenal; on CRRT;  Cont CRRT at current settings. All 4K bath.  UF as able.  2. BiV sCHF, AHF following, on dopamine, hypervolemia improving 3. B/l SDH, off heparin, NSG following 4. Afib on amio 5. Leukocytosis 6. OSA/OHS  Pearson Grippe, MD [facility] pgr [phone]

## 2017-03-10 NOTE — Plan of Care (Signed)
Encouraged pt to keep bair hugger on d/t low temp, she constantly kicks it off, starts shivering and wants it put back on.

## 2017-03-10 NOTE — Progress Notes (Signed)
Nutrition Follow-up  DOCUMENTATION CODES:   Obesity unspecified  INTERVENTION:   TPN per Pharmacy  Recommend replace Cortrak tube for trial of trickle feedings    NUTRITION DIAGNOSIS:   Inadequate oral intake related to acute illness, poor appetite as evidenced by per patient/family report. Ongoing.   GOAL:   Patient will meet greater than or equal to 90% of their needs Progressing  MONITOR:   Skin, I & O's, Diet advancement, Labs  REASON FOR ASSESSMENT:   Consult New TPN/TNA  ASSESSMENT:   56 year old female with PMH of Obesity presents to ED on 6/24 with complaints of weakness and pain for the last 3 days. 2 weeks prior had a mechanical fall which resulted in right knee pain. Patient reports that she has not had anything to eat or drink as she has been laying in bed for 3 days unable to move, reports she lives with a roommate however they refused to help. EMS arrived to house on 6/21 and found patient laying face down on an air-mattress, however refused help. EMS reports that living situation was poor and house was in un-livable conditions.  7/6 CVVHD, pt now 29 L negative with 55 lb weight loss since admission 7/11 Cortrak tube placed 7/13 Pt requested Cortrak tube be removed 7/14 TPN started  Today discussed with pt importance of using GI tract for nutrition. Pt refuses tube replacement, states it was too painful. Pt refused to eat/drink during prior visit.   Per MD very poor prognosis  Medications reviewed and include: senokot-s Labs reviewed: Na 133 (L), PO4 2.3 (L) CBG's: 157-133  Clinimix 5/15 (no electrolytes) advancing to 110 ml/hr tonight (7/16) Provides: 2640 ml, 1874 kcal (94% of needs) and 132 grams protein (100% of needs) No lipids first 7 days (start date 7/21) MVI daily, TE every other day Thiamine 100 mg added to TPN Na Phos 15 mmol IV x 1  Diet Order:  Diet NPO time specified TPN (CLINIMIX) Adult without lytes TPN (CLINIMIX) Adult without  lytes  Skin:   (stage II bilateral buttocks, non-pressure wound leg, MASD groin/leg)  Last BM:  7/14  Height:   Ht Readings from Last 1 Encounters:  03/10/17 5\' 5"  (1.651 m)    Weight:   Wt Readings from Last 1 Encounters:  03/10/17 182 lb 1.6 oz (82.6 kg)    Ideal Body Weight:  56.82 kg  BMI:  Body mass index is 30.3 kg/m.  Estimated Nutritional Needs:   Kcal:  0814-4818  Protein:  125-150 grams  Fluid:  per MD  EDUCATION NEEDS:   Education needs addressed  Maylon Peppers RD, [place], [place] Pager [phone] After Hours Pager

## 2017-03-10 NOTE — Progress Notes (Signed)
Physical Therapy Treatment Patient Details Name: Samantha Richards MRN: [id number] DOB: 11/09/1960 Today's Date: 03/10/2017    History of Present Illness patient is a 56 y.o. female who presented on 6/24 after a fall at home found to be in hemorrhagic/hypovolemic and cardiogenic shock, severely anemic in the setting of weeks of slow GI bleed and Hgb 3.7.with anemia after a fall also found to have Acute systolic CHF and anasarca    PT Comments    Patient limited to bed level activity per RN. Pt limited by pain when performing LE therex. PT will continue to follow acutely.    Follow Up Recommendations  SNF     Equipment Recommendations  Wheelchair (measurements PT);Hospital bed    Recommendations for Other Services       Precautions / Restrictions Precautions Precautions: Fall    Mobility  Bed Mobility Overal bed mobility: Needs Assistance Bed Mobility: Rolling Rolling: Min assist         General bed mobility comments: assist at LE when rolling  Transfers                 General transfer comment: pt limited to bed level activity  Ambulation/Gait                 Stairs            Wheelchair Mobility    Modified Rankin (Stroke Patients Only)       Balance Overall balance assessment:  (not assessed)                                          Cognition Arousal/Alertness: Awake/alert Behavior During Therapy: Flat affect Overall Cognitive Status: Within Functional Limits for tasks assessed                                 General Comments: speaking very softly      Exercises General Exercises - Lower Extremity Ankle Circles/Pumps: AROM;Both;20 reps Quad Sets: AROM;10 reps;Left Heel Slides: AAROM;10 reps;Left Hip ABduction/ADduction: AAROM;10 reps;Left    General Comments General comments (skin integrity, edema, etc.): pt c/o bilat LE pain and increased L hip pain with therex; pt declined R LE therex       Pertinent Vitals/Pain Pain Assessment: Faces Faces Pain Scale: Hurts little more Pain Location: L hip with therex; bilat LE Pain Descriptors / Indicators: Grimacing;Guarding;Cramping;Aching;Moaning Pain Intervention(s): Limited activity within patient's tolerance;Monitored during session;Premedicated before session;Repositioned    Home Living                      Prior Function            PT Goals (current goals can now be found in the care plan section) Progress towards PT goals: Not progressing toward goals - comment    Frequency    Min 3X/week      PT Plan Current plan remains appropriate    Co-evaluation              AM-PAC PT "6 Clicks" Daily Activity  Outcome Measure  Difficulty turning over in bed (including adjusting bedclothes, sheets and blankets)?: Total Difficulty moving from lying on back to sitting on the side of the bed? : Total Difficulty sitting down on and standing up from a chair with arms (e.g., wheelchair, bedside commode, etc,.)?:  Total Help needed moving to and from a bed to chair (including a wheelchair)?: Total Help needed walking in hospital room?: Total Help needed climbing 3-5 steps with a railing? : Total 6 Click Score: 6    End of Session   Activity Tolerance: Patient limited by pain Patient left: in bed;with call bell/phone within reach;with nursing/sitter in room;with SCD's reapplied Nurse Communication: Mobility status PT Visit Diagnosis: Difficulty in walking, not elsewhere classified (R26.2);Muscle weakness (generalized) (M62.81)     Time: 9604-5409 PT Time Calculation (min) (ACUTE ONLY): 20 min  Charges:  $Therapeutic Exercise: 8-22 mins                    G Codes:       Earney Navy, PTA Pager: [phone]     Darliss Cheney 03/10/2017, 4:34 PM

## 2017-03-10 NOTE — Progress Notes (Signed)
Pt reports no HA this am.  EXAM:  BP 96/85   Pulse 77   Temp (!) 97 F (36.1 C) (Axillary)   Resp (!) 21   Ht 5\' 5"  (1.651 m)   Wt 82.6 kg (182 lb 1.6 oz)   LMP 01/16/2017 Comment: neg preg 02-16-2017  SpO2 100%   BMI 30.30 kg/m   Awake, alert, oriented  Speech fluent, appropriate  CN grossly intact  Good strength throughout, no drift  IMPRESSION:  56 y.o. female with left tentorial subdural, neurologically stable essentially intact.   PLAN: - Cont current mgmt

## 2017-03-10 NOTE — Plan of Care (Signed)
Patient still wants to be turned every few minutes, wants bair hugger on and a few minutes later wants it off, restless, demanding, requiring frequent attention, moans most of time. Will continue to assess.

## 2017-03-10 NOTE — Progress Notes (Signed)
CSW met with patient to discuss possible family contacts.  Patient maintains that she has no family supports that we can contact but states she is agreeable to [place] contacting friends.  States that she would want her friend Mirian Capuchin contacted- tried to get # off of phone but phone was not charged- CSW hooked up phone to charger- from memory she thought # is [phone]- CSW will verify  Pt became agitated from discomfort and requested RN come to help to get off her back  CSW will continue to follow and assist with getting contacts patient is comfortable with [place] talking to  Jorge Ny, [place] Social Worker [phone]

## 2017-03-10 NOTE — Plan of Care (Signed)
Social work consult in per order Dr. Nelda Marseille to help find family for patient, number paged and message left.

## 2017-03-10 NOTE — Progress Notes (Addendum)
PHARMACY - ADULT TOTAL PARENTERAL NUTRITION CONSULT NOTE   Pharmacy Consult:  TPN Indication: Intolerance to EN  Patient Measurements: Height: 5\' 5"  (165.1 cm) Weight: 182 lb 1.6 oz (82.6 kg) IBW/kg (Calculated) : 57 TPN AdjBW (KG): 63.4 Body mass index is 30.3 kg/m.  Assessment:  56 YOF with no PMH presented on 02/16/17 with symptomatic anemia s/p a mechanical fall 2 weeks prior and some bloody stool for 2-3 weeks.  EGD on 02/20/17 showed esophagitis with local hemorrhage, gastritis and hiatal hernia and a biopsy was also completed.  Hospital course complicated by cardiorenal syndrome.  Patient did not have anything to eat or drink for 3 days PTA because she was unable to move.  RD unable to assess malnutrition given that muscle/fat wasting could be masked by severe edema.  Patient had intermittent PO intake throughout hospitalization until 03/05/17 when TF was initiated.  On 03/07/17, patient requested feeding tube to be removed.  MD concerned with patient's prognosis if nutrition is inadequate; therefore, Pharmacy consulted to manage TPN.  Patient is nutritionally at risk.  GI: admitted with GIB, H.pylori negative.  Prealbumin low at 5.6 - NG O/P 153mL, emesis x1 - PPI PO, PRN Zofran/Phenergan Endo: hypothyroid on Synthroid.  No hx DM - CBGs controlled Insulin requirements in the past 24 hours: 12 units SSI Lytes: low Na/CL, Phos low at 2.3, others WNL Renal: AKI, CRRT 7/6 >> 7/14, restart 7/15 - SCr 1.21, BUN WNL.  Net -27.7L since admit Pulm: cor pulmonale - stable on RA Cards: Afib/Aflutter - MAP 80-90s, CVP 18 - amio/DA gtts, off milrinone Heme: acute subdural hematoma s/p protamine 7/14, repeat CT showed midline shift - hgb 7.9, plts WNL, aPTT 48 Hepatobil: LFTs / tbili / TG WNL Neuro: Keppra x7 days for sz px.  Pain score 0-10, PRN APAP ID: s/p Vanc/CTX - afebrile, WBC up to 32.3 Best Practices: SCDs, CHG TPN Access: left-IJ triple lumen 02/18/17 (TPN administered via dialysis port  per Renal) TPN start date: 03/08/17  Nutritional Goals (per RD recommendation on 7/1): 2000-2300 kCal and 125-150 gm protein per day  Current Nutrition:  NPO Ensure Enlive (received 0 yesterday) Prostat 65mL BID (received 0 yesterday) TPN   Plan:  - Increase Clinimix 5/15 (no electrolytes) to 110 ml/hr.  Clinimix will provide 1874 kCal, 132gm of protein and 2663mL of IVF per day.  Clinimix will meet 94% of kCal and 100% of protein needs (2.3 g/kg/d of protein while on CRRT). - Hold lipid for the first 7 days of TPN in ICU patients per ASPEN/SCCM guidelines (start date 03/15/17). - D/C PO multivitamin daily.  Add to TPN. - Trace elements in TPN every other day, next due 7/17 - Add thiamine 100mg  to TPN - D/C KPhos Neutral 500mg  PO TID, KCL 1mEq PO BID - NaPhos 15 mmol IV x 1 - Continue moderate SSI Q4H - F/U AM labs, CBGs (may need to add insulin to TPN), meds to IV - Patient's GI tract is intact.  Please re-attempt/encourage tube feeding as appropriate.   Samantha Richards, PharmD, BCPS Pager:  [phone] 03/10/2017, 7:39 AM

## 2017-03-10 NOTE — Evaluation (Signed)
Clinical/Bedside Swallow Evaluation Patient Details  Name: Samantha Richards MRN: [id number] Date of Birth: June 26, 1961  Today's Date: 03/10/2017 Time: SLP Start Time (ACUTE ONLY): 0258 SLP Stop Time (ACUTE ONLY): 1545 SLP Time Calculation (min) (ACUTE ONLY): 12 min  Past Medical History: History reviewed. No pertinent past medical history. Past Surgical History:  Past Surgical History:  Procedure Laterality Date  . ESOPHAGOGASTRODUODENOSCOPY (EGD) WITH PROPOFOL N/A 02/20/2017   Procedure: ESOPHAGOGASTRODUODENOSCOPY (EGD) WITH PROPOFOL;  Surgeon: Teena Irani, MD;  Location: [facility];  Service: Endoscopy;  Laterality: N/A;   HPI: 56 y.o. female who presented after a fall at home found to be in hemorrhagic/hypovolemic with severe anemia and cardiogenic shock due to biventricular heart failure,  worsening anasarca and progressive renal failure with oliguria and hypotension.  CVVHD initiated 7/6, spontaneous SDH w/ evidence of herniation but neuro-surg feels not clinically a concern w/ her current neuro exam, acute GI bleed.       Assessment / Plan / Recommendation Clinical Impression  Bedside exam limited due to patient cooperation and c/o nausea following initial sip of liquid. Patient with weak cough in direct response to initial sip of thin liquid indicative of decreased airway protection. Will follow up at bedside 7/17 am for diagnostic po trials.  SLP Visit Diagnosis: Dysphagia, unspecified (R13.10)    Aspiration Risk       Diet Recommendation NPO   Medication Administration: Via alternative means    Other  Recommendations Oral Care Recommendations: Oral care QID   Follow up Recommendations  (TBD)      Frequency and Duration min 2x/week  2 weeks       Prognosis        Swallow Study   General Type of Study: Bedside Swallow Evaluation Previous Swallow Assessment: none Diet Prior to this Study: NPO Temperature Spikes Noted: No Respiratory Status: Room air History of Recent  Intubation: No Behavior/Cognition: Alert;Distractible;Requires cueing Oral Cavity Assessment: Within Functional Limits Oral Care Completed by SLP: Recent completion by staff Oral Cavity - Dentition: Adequate natural dentition Vision: Functional for self-feeding Self-Feeding Abilities: Able to feed self;Needs assist Patient Positioning: Upright in bed Baseline Vocal Quality: Hoarse;Low vocal intensity Volitional Cough: Weak Volitional Swallow: Able to elicit    Oral/Motor/Sensory Function Overall Oral Motor/Sensory Function: Within functional limits   Ice Chips Ice chips: Not tested   Thin Liquid Thin Liquid: Impaired Presentation: Cup Pharyngeal  Phase Impairments: Cough - Immediate    Nectar Thick Nectar Thick Liquid: Not tested   Honey Thick Honey Thick Liquid: Not tested   Puree Puree: Not tested   Solid   GO   Solid: Not tested        Samantha Richards Samantha Richards 03/10/2017,3:50 PM

## 2017-03-11 LAB — RENAL FUNCTION PANEL
ALBUMIN: 2.3 g/dL — AB (ref 3.5–5.0)
ANION GAP: 6 (ref 5–15)
Albumin: 2.3 g/dL — ABNORMAL LOW (ref 3.5–5.0)
Anion gap: 6 (ref 5–15)
BUN: 20 mg/dL (ref 6–20)
BUN: 22 mg/dL — AB (ref 6–20)
CALCIUM: 8.9 mg/dL (ref 8.9–10.3)
CALCIUM: 9.2 mg/dL (ref 8.9–10.3)
CHLORIDE: 100 mmol/L — AB (ref 101–111)
CO2: 26 mmol/L (ref 22–32)
CO2: 25 mmol/L (ref 22–32)
CREATININE: 1.07 mg/dL — AB (ref 0.44–1.00)
Chloride: 101 mmol/L (ref 101–111)
Creatinine, Ser: 1.05 mg/dL — ABNORMAL HIGH (ref 0.44–1.00)
GFR, EST NON AFRICAN AMERICAN: 60 mL/min — AB (ref >60)
GFR, EST NON AFRICAN AMERICAN: 58 mL/min — AB (ref >60)
Glucose, Bld: 168 mg/dL — ABNORMAL HIGH (ref 65–99)
Glucose, Bld: 158 mg/dL — ABNORMAL HIGH (ref 65–99)
PHOSPHORUS: 2 mg/dL — AB (ref 2.5–4.6)
POTASSIUM: 4 mmol/L (ref 3.5–5.1)
Phosphorus: 2.7 mg/dL (ref 2.5–4.6)
Potassium: 3.8 mmol/L (ref 3.5–5.1)
SODIUM: 133 mmol/L — AB (ref 135–145)
SODIUM: 131 mmol/L — AB (ref 135–145)

## 2017-03-11 LAB — CBC
HCT: 24.4 % — ABNORMAL LOW (ref 36.0–46.0)
HEMATOCRIT: 24.4 % — AB (ref 36.0–46.0)
HEMOGLOBIN: 7.2 g/dL — AB (ref 12.0–15.0)
HEMOGLOBIN: 7.6 g/dL — AB (ref 12.0–15.0)
MCH: 23.3 pg — AB (ref 26.0–34.0)
MCH: 24.8 pg — ABNORMAL LOW (ref 26.0–34.0)
MCHC: 29.5 g/dL — ABNORMAL LOW (ref 30.0–36.0)
MCHC: 31.1 g/dL (ref 30.0–36.0)
MCV: 79 fL (ref 78.0–100.0)
MCV: 79.5 fL (ref 78.0–100.0)
PLATELETS: 216 10*3/uL (ref 150–400)
Platelets: 189 10*3/uL (ref 150–400)
RBC: 3.09 MIL/uL — AB (ref 3.87–5.11)
RBC: 3.07 MIL/uL — AB (ref 3.87–5.11)
RDW: 32 % — ABNORMAL HIGH (ref 11.5–15.5)
RDW: 30.5 % — AB (ref 11.5–15.5)
WBC: 29.4 10*3/uL — AB (ref 4.0–10.5)
WBC: 28.2 10*3/uL — AB (ref 4.0–10.5)

## 2017-03-11 LAB — GLUCOSE, CAPILLARY
GLUCOSE-CAPILLARY: 139 mg/dL — AB (ref 65–99)
Glucose-Capillary: 165 mg/dL — ABNORMAL HIGH (ref 65–99)
Glucose-Capillary: 155 mg/dL — ABNORMAL HIGH (ref 65–99)
Glucose-Capillary: 160 mg/dL — ABNORMAL HIGH (ref 65–99)
Glucose-Capillary: 157 mg/dL — ABNORMAL HIGH (ref 65–99)

## 2017-03-11 LAB — APTT: APTT: 46 s — AB (ref 24–36)

## 2017-03-11 LAB — ABO/RH: ABO/RH(D): O POS

## 2017-03-11 LAB — CULTURE, BLOOD (ROUTINE X 2)
Culture: NO GROWTH
Culture: NO GROWTH
SPECIAL REQUESTS: ADEQUATE
SPECIAL REQUESTS: ADEQUATE

## 2017-03-11 LAB — COOXEMETRY PANEL
CARBOXYHEMOGLOBIN: 1.7 % — AB (ref 0.5–1.5)
Carboxyhemoglobin: 1.6 % — ABNORMAL HIGH (ref 0.5–1.5)
Carboxyhemoglobin: 1.6 % — ABNORMAL HIGH (ref 0.5–1.5)
Methemoglobin: 0.6 % (ref 0.0–1.5)
Methemoglobin: 0.9 % (ref 0.0–1.5)
Methemoglobin: 1.1 % (ref 0.0–1.5)
O2 SAT: 65.1 %
O2 Saturation: 47.4 %
O2 Saturation: 46.2 %
TOTAL HEMOGLOBIN: 6.7 g/dL — AB (ref 12.0–16.0)
Total hemoglobin: 7.2 g/dL — ABNORMAL LOW (ref 12.0–16.0)
Total hemoglobin: 7.2 g/dL — ABNORMAL LOW (ref 12.0–16.0)

## 2017-03-11 LAB — MAGNESIUM: MAGNESIUM: 2 mg/dL (ref 1.7–2.4)

## 2017-03-11 LAB — PREPARE RBC (CROSSMATCH)

## 2017-03-11 MED ORDER — SODIUM CHLORIDE 0.9 % IV SOLN: Freq: Once | INTRAVENOUS | Status: DC

## 2017-03-11 MED ORDER — SODIUM PHOSPHATES 45 MMOLE/15ML IV SOLN
30.0000 mmol | Freq: Once | INTRAVENOUS | Status: AC
  Administered 2017-03-11: 30 mmol via INTRAVENOUS
  Filled 2017-03-11: qty 10

## 2017-03-11 MED ORDER — TRACE MINERALS CR-CU-MN-SE-ZN 10-1000-500-60 MCG/ML IV SOLN
INTRAVENOUS | Status: AC
  Administered 2017-03-11: 17:00:00 via INTRAVENOUS
  Filled 2017-03-11: qty 2640

## 2017-03-11 MED ORDER — DOBUTAMINE IN D5W 4-5 MG/ML-% IV SOLN
2.5000 ug/kg/min | INTRAVENOUS | Status: DC
  Administered 2017-03-11: 2.5 ug/kg/min via INTRAVENOUS
  Filled 2017-03-11: qty 250

## 2017-03-11 NOTE — Progress Notes (Addendum)
PULMONARY / CRITICAL CARE MEDICINE   Name: Samantha Richards MRN: [id number] DOB: May 30, 1961    ADMISSION DATE:  02/16/2017 CONSULTATION DATE:  02/27/17  REFERRING MD:  Dr. Wynetta Emery  CHIEF COMPLAINT:  Hypotension  BRIEF SUMMARY:   Samantha Richards is a 56 y.o. female who presented to [facility] on 6/24 after a fall at home and was found to be in hemorrhagic/hypovolemic and cardiogenic shock. She was severely anemic to Hgb 3.7 in the setting of GI bleed. Requiring transfusions, pressors and BiPAP in ICU. She was also treated for cellulitis of the back. Levophed was weaned 6/28 and the patient was transferred to hospitalist service 6/29. Cardiology has been managing AFib with RVR with amiodarone gtt and biventricular heart failure R>L with lasix gtt. She had cardiogenic shock prompting transfer to [facility] on 7/4 for further evaluation by CHF team and initiation of milrinone and dopamine gtt. She also has anasarca and progressive renal failure with oliguria and hypotension.  Nephrology consulted, CVVHD initiated 7/6.    SUBJECTIVE:   No events overnight, no longer complaining of leg pain with dilaudid on board  VITAL SIGNS: BP 99/75   Pulse 98   Temp (!) 97.5 F (36.4 C) (Oral)   Resp (!) 30   Ht 5\' 5"  (1.651 m)   Wt 76.1 kg (167 lb 12.3 oz)   LMP 01/16/2017 Comment: neg preg 02-16-2017  SpO2 96%   BMI 27.92 kg/m   HEMODYNAMICS: CVP:  [6 mmHg-20 mmHg] 20 mmHg  VENTILATOR SETTINGS:    INTAKE / OUTPUT: I/O last 3 completed shifts: In: 4868.6 [I.V.:4300.6; IV Piggyback:568] Out: 03888 [Urine:2; Emesis/NG output:100; KCMKL:49179]  PHYSICAL EXAMINATION: General: Chronically ill appearing female, NAD HEENT: [place]/AT, PERRL, EOM-I and MMM PSY: Calm and appropriate Neuro: Awake and interactive, moving all ext to command CV: RRR, Nl S1/S2, -M/R/G. PULM: Bibasilar crackles GI: Soft, NT, ND and +BS, NGT in place Extremities: warm/dry, anasarca  Skin: no rashes or  lesions  LABS:  BMET  Recent Labs Lab 03/10/17 0523 03/10/17 1457 03/11/17 0406  NA 133* 136 133*  K 4.1 3.7 4.0  CL 100* 105 101  CO2 27 25 26   BUN 16 15 20   CREATININE 1.21* 0.97 1.05*  GLUCOSE 153* 126* 168*   Electrolytes  Recent Labs Lab 03/09/17 0457  03/10/17 0523 03/10/17 1457 03/11/17 0406  CALCIUM 9.1  < > 9.0 8.3* 8.9  MG 2.3  --  2.3  --  2.0  PHOS 3.6  < > 2.3* 2.3* 2.0*  < > = values in this interval not displayed. CBC  Recent Labs Lab 03/09/17 0457 03/10/17 0523 03/11/17 0406  WBC 25.4* 32.3* 29.4*  HGB 8.1* 7.9* 7.2*  HCT 26.8* 26.7* 24.4*  PLT 263 240 216   Coag's  Recent Labs Lab 03/09/17 0457 03/10/17 0523 03/11/17 0406  APTT 47* 48* 46*   Sepsis Markers No results for input(s): LATICACIDVEN, PROCALCITON, O2SATVEN in the last 168 hours. ABG  Recent Labs Lab 03/07/17 0500  PHART 7.463*  PCO2ART 39.7  PO2ART 61.9*   Liver Enzymes  Recent Labs Lab 03/10/17 0523 03/10/17 1457 03/11/17 0406  AST 16  --   --   ALT 14  --   --   ALKPHOS 101  --   --   BILITOT 1.0  --   --   ALBUMIN 2.5* 2.1* 2.3*   Cardiac Enzymes No results for input(s): TROPONINI, PROBNP in the last 168 hours.  Glucose  Recent Labs Lab 03/10/17 1158 03/10/17  1628 03/10/17 1957 03/10/17 2319 03/11/17 0410 03/11/17 0808  GLUCAP 133* 138* 166* 152* 165* 139*   Imaging No results found. STUDIES:  XR right Knee 6/24 >> No Acute CT abd/pelvis 6/25 >> anasarca with b/l pleural effusions, ascities. Fibroid uterus. Cardiomegaly with severe R atrial dilation. TTE 6/25 >> EF30-35%, diffuse hypokinesis, LA dilated, mild MR, RA mod dilated with signs of significant cor pulmonale. LE venous doppler 6/25 >> no DVT or SVT b/l US Paracentesis 6/26 >> 2.5 L peritoneal fluid yielded, cytology negative EGD 6/28 >> intense distal esophagitis with focal hemorrhagic hemorrhage, small hiatal hernia CXR 7/5 >> Cardiomegaly with vascular congestion, R midlung  atelectasis with LLL opacity.  CULTURES: MRSA PCR neg 6/24 Blood cultures 6/24 > 1 of 2 coag neg staph, likely contaminate Peritoneal fluid culture 6/26 > No growth 5 days, neg for AFB HIV 6/29 > negative  ANTIBIOTICS: Vancomycin 6/24 > 6/28 Rocephin 6/25 > 7/2 Keflex 7/2 > 7/4 Rocephin 7/6 >>7/12  SIGNIFICANT EVENTS: 6/24  Presents to [facility], admitted to ICU  6/29  Off vasopressors, transferred out to SDU 7/02  Lasix gtt initiated 7/04  Transferred to [facility]. Milrinone started 7/05  Dopamine started, transfer to ICU 7/06  Diffuse pain, requires pressor support with dopamine and a lasix drip.  Nausea.  CVVHD initiated.  7/07  Removed 2.3L in last 24 hours, remains positive balance.    LINES/TUBES: L IJ CVL 6/26 >> Foley 6/24 >> R IJ HD 7/6 >>   DISCUSSION: 56 y.o.femalewho presented after a fall at home found to be in hemorrhagic/hypovolemic with severe anemia and cardiogenic shock due to biventricular heart failure. Now with worsening anasarca and progressive renal failure with oliguria and hypotension.  CVVHD initiated 7/6.   Now w/ spont SDH w/ evidence of herniation but neuro-surg feels not clinically a concern w/ her current neuro exam  - I agree, I do not see this ending well for Ms Wissner. I do not think that she is able to make complex medical decisions, and she says she does not have family around. I suspect that she will arrest if this continues. Over all prognosis for this is terrible.  SW to find family for complex decision making.  If no success by AM will consider an ethics consult and a psych consult. - There is some concern about placement of her central line in the azygous  Vein. Will hold off on addressing this as would like to see her level out.   ASSESSMENT / PLAN:  NEUROLOGIC A:   Severe Deconditioning  Acute bilateral SDH L>R. Left to right shift w/ worsening uncal herniation.  Felt d/t heparin during CRRT per n-surg note. This is not common.  Being followed  by n-surg  P:   - Diet as tolerated - Neuro checks as needed - Keppra, maintain current dose - NS following and recommending conservative rx  PULMONARY A: Suspected OHS/OSA - intolerant of CPAP while inpatient (may have been related to fluid status) Increasing aspiration risk  pcxr personally reviewed. No sig change  P:   - Wean O2 as able for sat of 88-92% - Monitor sats - Monitor for aspiration risk  CARDIOVASCULAR A:  Acute systolic heart failure with RV overload/cor pulmonale and massive anasarca Afib with RVR, improved Cardiogenic shock  P:  - Heart failure team following - CRRT for 2 more days per renal - Continue tele monitoring - Continue to wean dopamine as able - Milrinone off - Amio drip  RENAL A:  Acute Kidney Injury/ cardiorenal syndrome  Oliguria P:  - CRRT for 2 more days per renal - F/u PRN chem. - Replace electrolytes as indicated.  GASTROINTESTINAL A:   Upper GI bleed on EGD Severe protein-calorie malnutrition Hepatic steatosis Ascites > cytology negative Peritoneal mets vs uterine fibroids P:   - TPN given ileus - PPI  - SLP per speech  HEMATOLOGIC A:   Anemia, likely multifactorial from GI bleed and chronic malnutrition hgb holding  P:  - Trend CBC - SCDs - Hold all anticoagulation  INFECTIOUS A:   Skin breakdown/wounds Cellulitis RLE Stage II Decubitus to Bilateral Buttock, RLE UTI  Off abx. No sig fever or change in leukocytosis  P:   - Cont wound care  - Trend fever and WBC curve  ENDOCRINE A:   Mild hyperglycemia Hypothyroid P:   - Repeat TSH 8/1 - Cont synthroid - CBG and SSI  Neuro: Leg pain, dilaudid  FAMILY  - Updates: Continue to wait for family support  The patient is critically ill with multiple organ systems failure and requires high complexity decision making for assessment and support, frequent evaluation and titration of therapies, application of advanced monitoring technologies and  extensive interpretation of multiple databases.   Critical Care Time devoted to patient care services described in this note is  35  Minutes. This time reflects time of care of this signee Dr Jennet Maduro. This critical care time does not reflect procedure time, or teaching time or supervisory time of PA/NP/Med student/Med Resident etc but could involve care discussion time.  Rush Farmer, M.D. [facility] Pulmonary/Critical Care Medicine. Pager: [phone]. After hours pager: [phone].  03/11/2017, 9:56 AM

## 2017-03-11 NOTE — Progress Notes (Signed)
  Hgb 7.2.  Repeat Coox this afternoon stable, but Hgb 6.7 (may be underestimated on Coox panel)  Will give 1 unit PURBCs  [employer], PA-C 03/11/2017 2:36 PM

## 2017-03-11 NOTE — Progress Notes (Signed)
CSW met with pt again to discuss contacts- pt maintains that only person she would want [place] to contact is Samantha Richards [phone]- pt states that she would want him contacted to help with medical decisions if she is unable to make decisions for herself.  CSW called Pilar Plate to confirm correct number- reached voicemail with his name on it and left message asking him to return call  Jorge Ny, [place] Social Worker [phone]

## 2017-03-11 NOTE — Progress Notes (Signed)
Palliative:  Palliative received consult. SLP seeing patient. Patient recently received dilaudid per RN so unable to address [place] or have conversation at this time. Await psych input. CSW visited and working to confirm a contact that could be potential Air traffic controller. Will attempt to visit with patient tomorrow and hopefully we can contact her friend, Pilar Plate, to assist [place] in [place] and decision making. Thank you for this consult.   No charge  Vinie Sill, NP Palliative Medicine Team Pager # [phone] (M-F 8a-5p) Team Phone # [phone] (Nights/Weekends)

## 2017-03-11 NOTE — Procedures (Signed)
Admit: 02/16/2017 LOS: 44  44F with anuric AKI llikely 2/2 ATN and Cardiorenal, hypervolemia, b/l systolic HF, recent severe GIB, b/l SDH   Current CRRT Prescription: Sart Date: 02/28/17 Catheter: R IJ TDC BFR: 200 Pre Blood Pump: 500 4K DFR: 1000 4K Replacement Rate: 300 4K Goal UF: net neg 168mL/hr Anticoagulation: none, hx/o SDH on heparin Clotting: ~q18h    S: More comfortable this AM 3.5L Net negative yesterday, off dopamine gtt Off pressors Clotted once yesterday, thsi AM at 0300   O: 07/16 0701 - 07/17 0700 In: 3411.2 [I.V.:2948.2; IV Piggyback:463] Out: 7733 [Urine:2]  Filed Weights   03/09/17 0457 03/10/17 0500 03/11/17 0400  Weight: 84.4 kg (186 lb 1.1 oz) 82.6 kg (182 lb 1.6 oz) 76.1 kg (167 lb 12.3 oz)     Recent Labs Lab 03/10/17 0523 03/10/17 1457 03/11/17 0406  NA 133* 136 133*  K 4.1 3.7 4.0  CL 100* 105 101  CO2 27 25 26   GLUCOSE 153* 126* 168*  BUN 16 15 20   CREATININE 1.21* 0.97 1.05*  CALCIUM 9.0 8.3* 8.9  PHOS 2.3* 2.3* 2.0*    Recent Labs Lab 03/09/17 0457 03/10/17 0523 03/11/17 0406  WBC 25.4* 32.3* 29.4*  NEUTROABS  --  29.4*  --   HGB 8.1* 7.9* 7.2*  HCT 26.8* 26.7* 24.4*  MCV 78.4 79.2 79.0  PLT 263 240 216    Scheduled Meds: . chlorhexidine  15 mL Mouth Rinse BID  . Chlorhexidine Gluconate Cloth  6 each Topical Daily  . feeding supplement (ENSURE ENLIVE)  237 mL Oral BID BM  . feeding supplement (PRO-STAT SUGAR FREE 64)  60 mL Oral BID  . fluticasone  2 spray Each Nare Daily  . insulin aspart  0-15 Units Subcutaneous Q4H  . levothyroxine  50 mcg Intravenous Daily  . mouth rinse  15 mL Mouth Rinse q12n4p  . pantoprazole (PROTONIX) IV  40 mg Intravenous Q12H  . senna-docusate  1 tablet Oral BID  . sodium chloride flush  10-40 mL Intracatheter Q12H  . traMADol  50 mg Oral Q12H   Continuous Infusions: . sodium chloride 250 mL (03/11/17 0700)  . amiodarone 30 mg/hr (03/11/17 0700)  . DOPamine Stopped (03/10/17 8921)   . levETIRAcetam Stopped (03/10/17 2215)  . dialysis replacement fluid (prismasate) 500 mL/hr at 03/11/17 0226  . dialysis replacement fluid (prismasate) 300 mL/hr at 03/10/17 1753  . dialysate (PRISMASATE) 1,000 mL/hr at 03/11/17 0226  . TPN (CLINIMIX) Adult without lytes 110 mL/hr at 03/11/17 0700   PRN Meds:.sodium chloride, acetaminophen (TYLENOL) oral liquid 160 mg/5 mL, acetaminophen, alum & mag hydroxide-simeth, camphor-menthol, guaiFENesin-dextromethorphan, HYDROmorphone (DILAUDID) injection, hydrOXYzine, iopamidol, lip balm, menthol-cetylpyridinium, ondansetron (ZOFRAN) IV, phenol, promethazine, promethazine, sodium chloride flush  ABG    Component Value Date/Time   PHART 7.463 (H) 03/07/2017 0500   PCO2ART 39.7 03/07/2017 0500   PO2ART 61.9 (L) 03/07/2017 0500   HCO3 28.0 03/07/2017 0500   ACIDBASEDEF 4.3 (H) 02/28/2017 0100   O2SAT 47.4 03/11/2017 0411    A/P  1. Dialysis dependent AKI, anuric,  Admission SCr 2; likely ATN/Cardiorenal; on CRRT;  Cont CRRT at current settings. All 4K bath.  UF as able. Now taht off pressors could consider transition to iHD, but given stable and tol UF con CRRT for now.   2. BiV sCHF, AHF following, on dopamine, hypervolemia improving 3. B/l SDH, off heparin, NSG following 4. Afib on amio 5. Leukocytosis 6. OSA/OHS  Pearson Grippe, MD [facility] pgr [phone]

## 2017-03-11 NOTE — Care Management Note (Signed)
Case Management Note Previous CM note initiated by Maryclare Labrador, RN 02/27/2017, 3:17 PM   Patient Details  Name: Samantha Richards MRN: [id number] Date of Birth: 10-25-1960  Subjective/Objective:   Pt admitted with HF   - transferred from [facility] with HF team  - on lasix and milrinone              Action/Plan:  PTA from home, recommendation is for SNF - CSW consulted   Expected Discharge Date:  02/26/17               Expected Discharge Plan:  [place] (CSW consulted )  In-House Referral:  Clinical Social Work  Discharge planning Services  CM Consult  Post Acute Care Choice:  NA Choice offered to:  NA  DME Arranged:    DME Agency:     HH Arranged:    [place] Agency:     Status of Service:  In process, will continue to follow  If discussed at Long Length of Stay Meetings, dates discussed:    Discharge Disposition:   Additional Comments:  03/11/17- 1055- Marvetta Gibbons RN,. CM- pt continues on CVVHD- per MD note- Acute bilateral SDH L>R. Left to right shift w/ worsening uncal herniation.Felt d/t heparin during CRRT per n-surg note.  -Being followed by n-surg -- continues on TPN- CSW working with pt to locate family- at this time pt states that she does not have family supports - CSW continues to follow for support contacts- CM will follow along with CSW   Dawayne Patricia, RN 03/11/2017, 10:56 AM [phone]

## 2017-03-11 NOTE — Progress Notes (Signed)
   Coox 46.2%. Will add dobutamine 2.5 mcg/kg/min per Dr. Tamala Julian "[address] [place], [place] 03/11/2017 11:20 AM

## 2017-03-11 NOTE — Progress Notes (Signed)
Pt seen this am, on CRRT. Minimal HA.   Awake, alert Good strength throughout.  56 y.o. woman with left tentorial SDH, has remained essentially normal from neurologic standpoint. - would cont to monitor neurologic exam, please call with any changes.

## 2017-03-11 NOTE — Progress Notes (Signed)
CSW was unable to find family, will have palliative care come and see patient.  Rush Farmer, M.D. [facility] Pulmonary/Critical Care Medicine. Pager: [phone]. After hours pager: [phone].

## 2017-03-11 NOTE — Progress Notes (Signed)
Advanced Heart Failure Rounding Note  PCP:  Primary Cardiologist: Dr Acie Fredrickson   Subjective:    02/26/17 started on milrinone and placed on lasix drip.   Evening of 02/27/17 pts pressures fell requiring more dopamine, became less responsive and UOP worsening. Pt transferred to [place].   CVVHD started 7/6   On 7/14 developed severe HA. CT scan notable for SDH with mass effect. Heparin stopped. Seen by [facility] who recommended conservative management.  On 7/15 developed some aphasia and had repeat CT which showed mild increase in midline shift. Continue medical management  Coox 47.4% this am off milrinone and dopamine. Remains on CVVHD and TPN. Tolerating > 200 output an hour.   Slept more comfortably overnight with dilaudid.  Denies HA.   WBC 29.4 this am. Dr. Beryle Beams thinks possible from meningeal irritation  Out 4.3 L total yesterday with CVVHD. Bed weight shows down 15 lbs. ? accuracy. CVP remains 18-20 range.   Objective:   Weight Range: 167 lb 12.3 oz (76.1 kg) Body mass index is 27.92 kg/m.   Vital Signs:   Temp:  [94.8 F (34.9 C)-98.1 F (36.7 C)] 97.5 F (36.4 C) (07/17 0800) Pulse Rate:  [73-99] 83 (07/17 0800) Resp:  [11-28] 20 (07/17 0700) BP: (82-196)/(62-126) 82/62 (07/17 0800) SpO2:  [96 %-100 %] 100 % (07/17 0800) Weight:  [167 lb 12.3 oz (76.1 kg)] 167 lb 12.3 oz (76.1 kg) (07/17 0400) Last BM Date: 03/08/17  Weight change: Filed Weights   03/09/17 0457 03/10/17 0500 03/11/17 0400  Weight: 186 lb 1.1 oz (84.4 kg) 182 lb 1.6 oz (82.6 kg) 167 lb 12.3 oz (76.1 kg)    Intake/Output:   Intake/Output Summary (Last 24 hours) at 03/11/17 0840 Last data filed at 03/11/17 0800  Gross per 24 hour  Intake          3391.24 ml  Output             7849 ml  Net         -4457.76 ml     Physical Exam   CVP 18-20 General: Chronically ill appearing. NAD.  HEENT: Normal Neck: Supple. RIJ trialysis cath LIJ TLC. Carotids 2+ bilat; no bruits. No thyromegaly or  nodule noted. Cor: PMI nondisplaced. Tachy, irregular. Lungs: Diminished basilar sounds.  Abdomen: Soft, non-tender, non-distended, no HSM. No bruits or masses. +BS  Extremities: No cyanosis, clubbing, or rash. 1-2+ edema.  Neuro: Alert & orientedx3, cranial nerves grossly intact. moves all 4 extremities w/o difficulty. Affect pleasant    Telemetry   Personally reviewed, A flutter 80-90s  EKG   N/A  Labs    CBC  Recent Labs  03/10/17 0523 03/11/17 0406  WBC 32.3* 29.4*  NEUTROABS 29.4*  --   HGB 7.9* 7.2*  HCT 26.7* 24.4*  MCV 79.2 79.0  PLT 240 885   Basic Metabolic Panel  Recent Labs  03/10/17 0523 03/10/17 1457 03/11/17 0406  NA 133* 136 133*  K 4.1 3.7 4.0  CL 100* 105 101  CO2 27 25 26   GLUCOSE 153* 126* 168*  BUN 16 15 20   CREATININE 1.21* 0.97 1.05*  CALCIUM 9.0 8.3* 8.9  MG 2.3  --  2.0  PHOS 2.3* 2.3* 2.0*   Liver Function Tests  Recent Labs  03/10/17 0523 03/10/17 1457 03/11/17 0406  AST 16  --   --   ALT 14  --   --   ALKPHOS 101  --   --   BILITOT 1.0  --   --  PROT 6.1*  --   --   ALBUMIN 2.5* 2.1* 2.3*     BNP (last 3 results)  Recent Labs  02/16/17 1516  BNP 923.6*       Imaging   Transthoracic Echocardiography Study Conclusions  - Left ventricle: Septal flattening consistant with elevated RV   pressures. Systolic function was moderately to severely reduced.   The estimated ejection fraction was in the range of 30% to 35%.   Diffuse hypokinesis. - Mitral valve: There was mild regurgitation. - Left atrium: The atrium was mildly dilated. - Right ventricle: The cavity size was moderately dilated. - Right atrium: The atrium was moderately dilated. - Atrial septum: No defect or patent foramen ovale was identified. - Tricuspid valve: There was moderate-severe regurgitation. - Pericardium, extracardiac: A trivial pericardial effusion was   identified. - Impressions: Suspect PA pressure underestimated by TR  velocity   due to RV dysfunction. There is moderate RV enlargement with   severe hypokinesis and signs of significant cor pulmonale.  Impressions:  - Suspect PA pressure underestimated by TR velocity due to RV   dysfunction. There is moderate RV enlargement with severe   hypokinesis and signs of significant cor pulmonale.     Medications:     Scheduled Medications: . chlorhexidine  15 mL Mouth Rinse BID  . Chlorhexidine Gluconate Cloth  6 each Topical Daily  . feeding supplement (ENSURE ENLIVE)  237 mL Oral BID BM  . feeding supplement (PRO-STAT SUGAR FREE 64)  60 mL Oral BID  . fluticasone  2 spray Each Nare Daily  . insulin aspart  0-15 Units Subcutaneous Q4H  . levothyroxine  50 mcg Intravenous Daily  . mouth rinse  15 mL Mouth Rinse q12n4p  . pantoprazole (PROTONIX) IV  40 mg Intravenous Q12H  . senna-docusate  1 tablet Oral BID  . sodium chloride flush  10-40 mL Intracatheter Q12H  . traMADol  50 mg Oral Q12H    Infusions: . sodium chloride 250 mL (03/11/17 0800)  . amiodarone 30 mg/hr (03/11/17 0800)  . DOPamine Stopped (03/10/17 2353)  . levETIRAcetam Stopped (03/10/17 2215)  . dialysis replacement fluid (prismasate) 500 mL/hr at 03/11/17 0226  . dialysis replacement fluid (prismasate) 300 mL/hr at 03/10/17 1753  . dialysate (PRISMASATE) 1,000 mL/hr at 03/11/17 0817  . sodium phosphate  Dextrose 5% IVPB    . TPN (CLINIMIX) Adult without lytes 110 mL/hr at 03/11/17 0800    PRN Medications: sodium chloride, acetaminophen (TYLENOL) oral liquid 160 mg/5 mL, acetaminophen, alum & mag hydroxide-simeth, camphor-menthol, guaiFENesin-dextromethorphan, HYDROmorphone (DILAUDID) injection, hydrOXYzine, iopamidol, lip balm, menthol-cetylpyridinium, ondansetron (ZOFRAN) IV, phenol, promethazine, promethazine, sodium chloride flush    Patient Profile   56 y/o woman with morbid obesity and little previous medical follow-up. Admitted 6/24 with severe anemia (hgb 3.7) and  cardiogenic shock due biventricular HF R>L. Echo reviewed EF 30-35% with severe RV Failure and PAH. Has anasarca and progressive renal failure with inability to mobilize fluid. Transferred to [facility] on 7/4 for further evaluation by CHF team.   Assessment/Plan   1. 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  Low volts on ECG concerning for amyloid. Creatinine too high at baseline for cMRI.  - Consider TPY scan when more stable.  - Milrinone stopped 03/09/17. Off dopamine. Coox 47.4% this am. Repeat  pending.  - CVP remains 19-20 today. Remains on CVVHD Pulling 150-250 an hour. Weight shows down 15 lbs but ? Accuracy.  - No beta blocker with acute decompensation.  - Eventually will need further w/u for biventricular CM if/when she is improved. Including VQ, possible L/R heart cath and sleep study. Remains too unstable for now.  - Suspect severe malnutrition may be key factor in her entire presentation. No change.   2. AKI  - remains on CVVHD. Weight shows down  84 lbs from highest weight this admission.  - CVP remains elevated. Continue CVVHD.  - Appreciate renal input.   4. Intracranial hemorrhage with SDH - developed on 7/13 - Heparin stopped. CTs reviewed personally. ? Some mild progression overnight radiographically - Exam stable this am. Less agitated. Denies HAs.  - Dr. Haroldine Laws spoke with [facility] personally 03/09/17 and will continue conservative management given lack of neuro symptoms and location of bleed (which leads to upward pressure on brain and lower risk of herniation) - All heparin on hold - Continue frequent neuro checks - Repeat imaging per Neuro as needed.    5. Iron deficiency anemia - Hgb on admit was 3.7. With low MCV. Iron stores low. - EGD 02/20/17 with severe esophagitis  - Received  feraheme 02/26/2017.  - Hgb down to 7.2 this am. She is off all heparin due to SDH - Will need colonoscopy when more stable.   6. Atrial flutter - Rates controlled. Continue amio.   - Off heparin due to SDH so not candidate for DC-CV or possible ablation. No change.  7. Anasarca/ascites in setting of severe protein calorie malnutition - CT abdomen with hepatic stenosis.  - Likely due to combination of RHF and fatty liver. Albumin 2.7 02/18/17 - prealbumin was < 5 on 02/18/17. - Failed tube feeds. Intake remains poor. TPN started 7/14. No change.  8. Leukocytosis - Persistently elevated in 20-25K range. Yesterday up to 32k. 29K today. Has completed rocephin for a UTI.  - No obvious sites of infection.  PCT only 1.44 02/27/17.  - Seen by Dr. Beryle Beams on 7/9 and 03/11/17. Smear ok. No evidence of toxic process or malignancy at this point.  - Can proceed with further workup as needed.   9. Severe fibroid uterus  10. Morbid obesity - Nutrition following .    11. RLE wound - WOC has seen. Wounds improving. Heme does not think these are source of WBC.    12. Probable OSA/OHS - Bipap as needed. No change.   13-MASD (Moisture Associated Skin Damage) Buttock Wounds- Partial thickness- dose not appear infected. Appears to be related to moisture and fecal incontinence.  Prealbumin <5.  Wound Care appreciated.   Remains tenuous but slowly improving.  Repeat coox pending. Has been stable past couple of days off milrinone. Would like to avoid adding back if possible.   Length of Stay: [place], [place]  03/11/2017, 8:40 AM  Advanced Heart Failure Team Pager [phone] (M-F; 7a - 4p)  Please contact [facility] for night-coverage after hours (4p -7a ) and weekends on [email or web address]  Patient seen with PA, agree with the above note.  Dopamine was stopped, but co-ox fell into the 40s range so dobutamine 2.5 has been started.  CVP remains 18+.  - Continue CVVH, pulling 175 cc/hr.    - Continue dobutamine, check co-ox on dobutamine.   Remains in atrial flutter, rate controlled on amiodarone.  No anticoagulation with SDH.   Agree with palliative  care consult.   Loralie Champagne 03/11/2017 1:29 PM

## 2017-03-11 NOTE — Progress Notes (Signed)
  Speech Language Pathology Treatment: Dysphagia  Patient Details Name: Samantha Richards MRN: [id number] DOB: [birth year] Today's Date: 03/11/2017 Time: 9767-3419 SLP Time Calculation (min) (ACUTE ONLY): 13 min  Assessment / Plan / Recommendation Clinical Impression  Pt just given Dilaudid however with adequate alertness. Consumed several sips thin water and small bites applesauce and pt complained of nausea and requested to lower head of bed. Pt needs to consume increased volume to make safe decisions re: initiation of solids/thin. She will likely need objective testing when appropriate. Will return tomorrow.    HPI HPI: 56 y.o.femalewho presented after a fall at home found to be in hemorrhagic/hypovolemic with severe anemia and cardiogenic shock due to biventricular heart failure,  worsening anasarca and progressive renal failure with oliguria and hypotension. CVVHD initiated 7/6, spontaneous SDH w/ evidence of herniation but neuro-surg feels not clinically a concern w/ her current neuro exam, acute GI bleed.        SLP Plan  Continue with current plan of care       Recommendations  Diet recommendations: NPO Medication Administration: Via alternative means                Oral Care Recommendations: Oral care QID Follow up Recommendations:  (TBD) SLP Visit Diagnosis: Dysphagia, unspecified (R13.10) Plan: Continue with current plan of care       GO                Samantha Richards, Samantha Richards 03/11/2017, 3:36 PM  Samantha Richards M.Ed [employer] [phone]

## 2017-03-11 NOTE — Progress Notes (Signed)
PHARMACY - ADULT TOTAL PARENTERAL NUTRITION CONSULT NOTE   Pharmacy Consult:  TPN Indication: Intolerance to EN  Patient Measurements: Height: 5\' 5"  (165.1 cm) Weight: 167 lb 12.3 oz (76.1 kg) IBW/kg (Calculated) : 57 TPN AdjBW (KG): 61.8 Body mass index is 27.92 kg/m.  Assessment:  75 YOF with no PMH presented on 02/16/17 with symptomatic anemia s/p a mechanical fall 2 weeks prior and some bloody stool for 2-3 weeks.  EGD on 02/20/17 showed esophagitis with local hemorrhage, gastritis and hiatal hernia and a biopsy was also completed.  Hospital course complicated by cardiorenal syndrome.  Patient did not have anything to eat or drink for 3 days PTA because she was unable to move.  RD unable to assess malnutrition given that muscle/fat wasting could be masked by severe edema.  Patient had intermittent PO intake throughout hospitalization until 03/05/17 when TF was initiated.  On 03/07/17, patient requested feeding tube to be removed.  MD concerned with patient's prognosis if nutrition is inadequate; therefore, Pharmacy consulted to manage TPN.  Patient is nutritionally at risk.  GI: admitted with GIB, H.pylori negative.  LBM 7/14.  Prealbumin low at 5.6 - NG O/P 149mL, emesis x1 - PPI PO, PRN Zofran/Phenergan Endo: hypothyroid on Synthroid.  No hx DM - CBGs 126-168 Insulin requirements in the past 24 hours: 15 units SSI Lytes:  Phos 2- dec s/p NaPhos 41mmol on 7/16, others WNL Renal: AKI- anuric, CRRT 7/6 >> 7/14, restart 7/15 - SCr 1.05, BUN WNL.  Net -31.2L since admit.  Off pressors, considering transition to iHD Pulm: cor pulmonale - stable on RA Cards: Afib/Aflutter - MAP 80-90s, CVP 20.   Amiodarone for AFib Heme: acute subdural hematoma s/p protamine 7/14, repeat CT showed midline shift - hgb 7.2, plts WNL, aPTT 46 Hepatobil: LFTs / tbili / TG WNL Neuro: Keppra x7 days for sz px.  Pain score 0-10, PRN APAP ID: s/p Vanc/CTX - afebrile, WBC 29.4- reactive 2nd SDH.   Best Practices:  SCDs, CHG TPN Access: left-IJ triple lumen 02/18/17 (TPN administered via dialysis port per Renal) TPN start date: 03/08/17  Nutritional Goals (per RD recommendation on 7/1): 2000-2300 kCal and 125-150 gm protein per day  Current Nutrition:  NPO as of 7/15 Ensure Enlive (received 0 yesterday) Prostat 12mL BID (received 0 yesterday) TPN   Plan:  - Clinimix 5/15 (no electrolytes) at 110 ml/hr.  Clinimix will provide 1874 kCal, 132gm of protein and 2657mL of IVF per day.  Clinimix will meet 94% of kCal and 100% of protein needs (2.3 g/kg/d of protein while on CRRT). - Hold lipid for the first 7 days of TPN in ICU patients per ASPEN/SCCM guidelines (start date 03/15/17). - MVI to TPN. - Trace elements in TPN every other day, next due 7/17 - Add thiamine 100mg  to TPN - Add insulin 10 units to TPN - NaPhos 30 mmol IV x 1 ordered per renal - Continue moderate SSI Q4H - F/U AM labs, meds to IV - Patient's GI tract is intact.  Please re-attempt/encourage tube feeding as appropriate.    Lewie Chamber., PharmD Clinical Pharmacist [facility] [phone] until 3p, then [phone]

## 2017-03-11 NOTE — Plan of Care (Signed)
CRITICAL VALUE ALERT  Critical Value: Hgb 6.7 on Co OX  Date & Time Notied:  03/11/17/ 1434  Provider Notified: Chalmers Cater PA  Orders Received/Actions taken: to discuss with MD

## 2017-03-11 NOTE — Progress Notes (Signed)
Nutrition Follow-up  DOCUMENTATION CODES:   Obesity unspecified  INTERVENTION:   TPN per Pharmacy - maximize protein as able  Encourage pt to participate in swallow evaluation Recommend consider replacement of cortrak tube for enteral nutrition.    NUTRITION DIAGNOSIS:   Malnutrition (Severe) related to acute illness (cardiogenic shock) as evidenced by energy intake < or equal to 50% for > or equal to 5 days, severe depletion of muscle mass, severe depletion of body fat, severe fluid accumulationOngoing.   GOAL:   Patient will meet greater than or equal to 90% of their needs Met.   MONITOR:   Skin, I & O's, Diet advancement, Labs   ASSESSMENT:   Pt admitted 6/24 after a fall at home and was found to be in hemorrhagic/hypovolemic and cardiogenic shock. She was severely anemic to Hgb 3.7 in the setting of GI bleed. Requiring transfusions, pressors and BiPAP in ICU. She was also treated for cellulitis of the back. Levophed was weaned 6/28 and the patient was transferred to hospitalist service 6/29. Cardiology has been managing AFib with RVR with amiodarone gtt and biventricular heart failure R>L with lasix gtt. She had cardiogenic shock prompting transfer to [facility] on 7/4 for further evaluation by CHF team and initiation of milrinone and dopamine gtt. She also has anasarca and progressive renal failure with oliguria and hypotension. CVVHD initiated 7/6.    Throughout admission pt with very minimal intake, meal completion <25% with most days less than 10%. Weight has decreased by 70 lb, some fluid but pt also losing weight due to 18 days of inadequate nutrition.   7/6 CVVHD - 2 ml UOP x 24 hours 7/11 - 7/13 pt was on TF via Cortrak tube 7/14 TPN started  Pt has refused Cortrak replacement and swallow eval.   Nutrition-Focused physical exam completed. Findings are severe orbital fat depletion (also mild/moderate fat depletion in the thoracic/lumbar region), severe interosseous  muscle depletion (also mild/moderate muscle depletion of temples, scapula, and deltoid), and severe edema.   Labs reviewed: hemoglobin 6.7 (L), Na 131 (L)  Clinimix 5/15 (no electrolytes) @ 110 ml/hr  Provides: 2640 ml, 1874 kcal (94% of needs) and 132 grams protein (100% of needs) No lipids first 7 days (start date 7/21) MVI daily, TE every other day Thiamine 100 mg added to TPN Na Phos 15 mmol IV x 1  Diet Order:  Diet NPO time specified TPN (CLINIMIX) Adult without lytes TPN (CLINIMIX) Adult without lytes  Skin:   (stage II bilateral buttocks, non-pressure wound leg, MASD groin/leg)  Last BM:  7/14  Height:   Ht Readings from Last 1 Encounters:  03/11/17 _0  (1.651 m)    Weight:   Wt Readings from Last 1 Encounters:  03/11/17 167 lb 12.3 oz (76.1 kg)    Ideal Body Weight:  56.82 kg  BMI:  Body mass index is 27.92 kg/m.  Estimated Nutritional Needs:   Kcal:  1655-3748  Protein:  125-150 grams  Fluid:  per MD  EDUCATION NEEDS:   Education needs addressed  Maylon Peppers RD, [place], [place] Pager [phone] After Hours Pager

## 2017-03-11 NOTE — Progress Notes (Signed)
Hematology: Bump in white cell count yesterday up to 32,000.  29,000 this morning.  She remains afebrile.  Majority of cultures have been negative except for a single blood culture which was felt to be a contaminant.  CT brain yesterday shows extension of subdural hematomas.  In retrospect, it seems most likely that the etiology of her nonspecific leukocytosis reflects meningeal irritation from subdural hematoma formation and the toxic/inflammatory effects of free hemoglobin. Per nursing, all of her decubitus ulcers are healing.  I never thought that these were primarily responsible for her leukocytosis to begin with. Impression: Reactive leukocytosis Recommendation: Continue observation alone.

## 2017-03-12 DIAGNOSIS — Z515 Encounter for palliative care: Secondary | ICD-10-CM

## 2017-03-12 DIAGNOSIS — Z7189 Other specified counseling: Secondary | ICD-10-CM

## 2017-03-12 LAB — GLUCOSE, CAPILLARY
GLUCOSE-CAPILLARY: 124 mg/dL — AB (ref 65–99)
GLUCOSE-CAPILLARY: 125 mg/dL — AB (ref 65–99)
GLUCOSE-CAPILLARY: 149 mg/dL — AB (ref 65–99)
GLUCOSE-CAPILLARY: 151 mg/dL — AB (ref 65–99)
GLUCOSE-CAPILLARY: 152 mg/dL — AB (ref 65–99)
Glucose-Capillary: 144 mg/dL — ABNORMAL HIGH (ref 65–99)
Glucose-Capillary: 147 mg/dL — ABNORMAL HIGH (ref 65–99)

## 2017-03-12 LAB — BPAM RBC
BLOOD PRODUCT EXPIRATION DATE: 201808082359
ISSUE DATE / TIME: 201807171604
UNIT TYPE AND RH: 5100

## 2017-03-12 LAB — CBC
HEMATOCRIT: 25.6 % — AB (ref 36.0–46.0)
HEMOGLOBIN: 7.9 g/dL — AB (ref 12.0–15.0)
MCH: 24.6 pg — ABNORMAL LOW (ref 26.0–34.0)
MCHC: 30.9 g/dL (ref 30.0–36.0)
MCV: 79.8 fL (ref 78.0–100.0)
Platelets: 162 10*3/uL (ref 150–400)
RBC: 3.21 MIL/uL — ABNORMAL LOW (ref 3.87–5.11)
RDW: 30.2 % — ABNORMAL HIGH (ref 11.5–15.5)
WBC: 26.3 10*3/uL — ABNORMAL HIGH (ref 4.0–10.5)

## 2017-03-12 LAB — CK: Total CK: 9 U/L — ABNORMAL LOW (ref 38–234)

## 2017-03-12 LAB — APTT: APTT: 47 s — AB (ref 24–36)

## 2017-03-12 LAB — RENAL FUNCTION PANEL
Albumin: 2.3 g/dL — ABNORMAL LOW (ref 3.5–5.0)
Albumin: 2.2 g/dL — ABNORMAL LOW (ref 3.5–5.0)
Anion gap: 6 (ref 5–15)
Anion gap: 7 (ref 5–15)
BUN: 24 mg/dL — AB (ref 6–20)
BUN: 27 mg/dL — ABNORMAL HIGH (ref 6–20)
CHLORIDE: 99 mmol/L — AB (ref 101–111)
CHLORIDE: 99 mmol/L — AB (ref 101–111)
CO2: 25 mmol/L (ref 22–32)
CO2: 25 mmol/L (ref 22–32)
CREATININE: 1.15 mg/dL — AB (ref 0.44–1.00)
Calcium: 8.9 mg/dL (ref 8.9–10.3)
Calcium: 8.9 mg/dL (ref 8.9–10.3)
Creatinine, Ser: 1.12 mg/dL — ABNORMAL HIGH (ref 0.44–1.00)
GFR calc Af Amer: >60 mL/min (ref >60)
GFR calc non Af Amer: 53 mL/min — ABNORMAL LOW (ref >60)
GFR, EST NON AFRICAN AMERICAN: 55 mL/min — AB (ref >60)
Glucose, Bld: 147 mg/dL — ABNORMAL HIGH (ref 65–99)
Glucose, Bld: 140 mg/dL — ABNORMAL HIGH (ref 65–99)
POTASSIUM: 3.7 mmol/L (ref 3.5–5.1)
POTASSIUM: 3.7 mmol/L (ref 3.5–5.1)
Phosphorus: 1.6 mg/dL — ABNORMAL LOW (ref 2.5–4.6)
Phosphorus: 2.9 mg/dL (ref 2.5–4.6)
Sodium: 130 mmol/L — ABNORMAL LOW (ref 135–145)
Sodium: 131 mmol/L — ABNORMAL LOW (ref 135–145)

## 2017-03-12 LAB — TYPE AND SCREEN
ABO/RH(D): O POS
Antibody Screen: NEGATIVE
UNIT DIVISION: 0

## 2017-03-12 LAB — COOXEMETRY PANEL
CARBOXYHEMOGLOBIN: 1.7 % — AB (ref 0.5–1.5)
Methemoglobin: 1.2 % (ref 0.0–1.5)
O2 SAT: 66.3 %
TOTAL HEMOGLOBIN: 6.3 g/dL — AB (ref 12.0–16.0)

## 2017-03-12 LAB — MAGNESIUM: MAGNESIUM: 2.1 mg/dL (ref 1.7–2.4)

## 2017-03-12 MED ORDER — ACETAMINOPHEN 160 MG/5ML PO SOLN: 650.0000 mg | Freq: Four times a day (QID) | ORAL | Status: DC | PRN

## 2017-03-12 MED ORDER — THIAMINE HCL 100 MG/ML IJ SOLN
INTRAVENOUS | Status: DC
  Administered 2017-03-12: 17:00:00 via INTRAVENOUS
  Filled 2017-03-12: qty 2640

## 2017-03-12 MED ORDER — SODIUM PHOSPHATES 45 MMOLE/15ML IV SOLN
30.0000 mmol | Freq: Once | INTRAVENOUS | Status: AC
  Administered 2017-03-12: 30 mmol via INTRAVENOUS
  Filled 2017-03-12: qty 10

## 2017-03-12 MED ORDER — ACETAMINOPHEN 325 MG PO TABS: 650.0000 mg | ORAL_TABLET | Freq: Four times a day (QID) | ORAL | Status: DC | PRN

## 2017-03-12 NOTE — Progress Notes (Signed)
  Speech Language Pathology Treatment: Dysphagia  Patient Details Name: Samantha Richards MRN: [id number] DOB: 03/18/1961 Today's Date: 03/12/2017 Time: 9470-9628 SLP Time Calculation (min) (ACUTE ONLY): 45 min  Assessment / Plan / Recommendation Clinical Impression  Pt seen at bedside for po trials. Pt completed oral care after set up. Pt pleasant and cooperative, slightly tearful at the end of the session due to being tired and frustrated. Pt was given ice chips, sips of water and ginger ale, applesauce, and saltine cracker. Pt reported ginger ale burned her throat, and declined additional trials. Throat clearing and cough noted following ice chips and small sips of thin liquid. Pt appeared to tolerate small boluses of applesauce and saltine cracker without overt s/s aspiration or decline in respiratory status, however, pt reported nausea throughout, and was unable to continue with sufficient po trials to warrant proceeding with objective study at this time.   Recommend continuing with NPO status, including meds, with consideration of trials of puree, crackers, and nectar thick liquids with speech therapy, until pt able to demonstrate sufficient tolerance/endurance/intake to proceed with objective study. RN present during session, and is aware of recommendations.    HPI HPI: 56 y.o.femalewho presented after a fall at home found to be in hemorrhagic/hypovolemic with severe anemia and cardiogenic shock due to biventricular heart failure,  worsening anasarca and progressive renal failure with oliguria and hypotension. CVVHD initiated 7/6, spontaneous SDH w/ evidence of herniation but neuro-surg feels not clinically a concern w/ her current neuro exam, acute GI bleed.        SLP Plan  Continue with current plan of care       Recommendations  Diet recommendations: NPO Medication Administration: Via alternative means                Oral Care Recommendations: Oral care QID Follow up  Recommendations:  (TBD) SLP Visit Diagnosis: Dysphagia, unspecified (R13.10) Plan: Continue with current plan of care       Celia B. [place], [facility], [place]  Shonna Chock 03/12/2017, 10:24 AM

## 2017-03-12 NOTE — Procedures (Signed)
Admit: 02/16/2017 LOS: 49  83F with anuric AKI llikely 2/2 ATN and Cardiorenal, hypervolemia, b/l systolic HF, recent severe GIB, b/l SDH   Current CRRT Prescription: Sart Date: 02/28/17 Catheter: R IJ TDC BFR: 200 Pre Blood Pump: 500 4K DFR: 1000 4K Replacement Rate: 300 4K Goal UF: net neg 161mL/hr Anticoagulation: none, hx/o SDH on heparin Clotting: ~q18h    S: Started on dobutamine yesterday Remains on CRRT; net ~4L negative yesterday Clotting around q24h w/o anticoagulation/citrate No UOP Transfused 1u pRBC  O: 07/17 0701 - 07/18 0700 In: 4224.2 [I.V.:3478.2; Blood:350; IV Piggyback:396] Out: 8191 [Urine:1]  Filed Weights   03/10/17 0500 03/11/17 0400 03/12/17 0705  Weight: 82.6 kg (182 lb 1.6 oz) 76.1 kg (167 lb 12.3 oz) 71.1 kg (156 lb 12 oz)     Recent Labs Lab 03/11/17 0406 03/11/17 1457 03/12/17 0413  NA 133* 131* 130*  K 4.0 3.8 3.7  CL 101 100* 99*  CO2 26 25 25   GLUCOSE 168* 158* 147*  BUN 20 22* 24*  CREATININE 1.05* 1.07* 1.12*  CALCIUM 8.9 9.2 8.9  PHOS 2.0* 2.7 1.6*    Recent Labs Lab 03/10/17 0523 03/11/17 0406 03/11/17 2000 03/12/17 0413  WBC 32.3* 29.4* 28.2* 26.3*  NEUTROABS 29.4*  --   --   --   HGB 7.9* 7.2* 7.6* 7.9*  HCT 26.7* 24.4* 24.4* 25.6*  MCV 79.2 79.0 79.5 79.8  PLT 240 216 189 162    Scheduled Meds: . chlorhexidine  15 mL Mouth Rinse BID  . Chlorhexidine Gluconate Cloth  6 each Topical Daily  . feeding supplement (ENSURE ENLIVE)  237 mL Oral BID BM  . feeding supplement (PRO-STAT SUGAR FREE 64)  60 mL Oral BID  . fluticasone  2 spray Each Nare Daily  . insulin aspart  0-15 Units Subcutaneous Q4H  . levothyroxine  50 mcg Intravenous Daily  . mouth rinse  15 mL Mouth Rinse q12n4p  . pantoprazole (PROTONIX) IV  40 mg Intravenous Q12H  . senna-docusate  1 tablet Oral BID  . sodium chloride flush  10-40 mL Intracatheter Q12H  . traMADol  50 mg Oral Q12H   Continuous Infusions: . Marland KitchenTPN (CLINIMIX-E) Adult    .  sodium chloride 250 mL (03/11/17 1900)  . sodium chloride    . amiodarone 30 mg/hr (03/12/17 0003)  . DOBUTamine 2.5 mcg/kg/min (03/11/17 1900)  . levETIRAcetam Stopped (03/11/17 2300)  . dialysis replacement fluid (prismasate) 500 mL/hr at 03/11/17 2312  . dialysis replacement fluid (prismasate) 300 mL/hr at 03/12/17 0627  . dialysate (PRISMASATE) 1,000 mL/hr at 03/12/17 0627  . sodium phosphate  Dextrose 5% IVPB 30 mmol (03/12/17 0753)  . TPN (CLINIMIX) Adult without lytes 110 mL/hr at 03/11/17 1900   PRN Meds:.sodium chloride, acetaminophen (TYLENOL) oral liquid 160 mg/5 mL, acetaminophen, alum & mag hydroxide-simeth, camphor-menthol, guaiFENesin-dextromethorphan, HYDROmorphone (DILAUDID) injection, hydrOXYzine, iopamidol, lip balm, menthol-cetylpyridinium, ondansetron (ZOFRAN) IV, phenol, promethazine, promethazine, sodium chloride flush  ABG    Component Value Date/Time   PHART 7.463 (H) 03/07/2017 0500   PCO2ART 39.7 03/07/2017 0500   PO2ART 61.9 (L) 03/07/2017 0500   HCO3 28.0 03/07/2017 0500   ACIDBASEDEF 4.3 (H) 02/28/2017 0100   O2SAT 66.3 03/12/2017 0410    A/P  1. Dialysis dependent AKI, anuric,  Admission SCr 2; likely ATN/Cardiorenal; on CRRT;  Cont CRRT at current settings. All 4K bath.  UF as able. Back on inotrope, cont CRRT for now since tolerating well and UF very succsessful.  No r 2.  BiV sCHF, AHF following, on dobutamine, hypervolemia improving 3. B/l SDH, off heparin, NSG following 4. Afib on amio 5. Leukocytosis, reactive 6. OSA/OHS  Pearson Grippe, MD [facility] pgr [phone]

## 2017-03-12 NOTE — Progress Notes (Signed)
PT Cancellation Note  Patient Details Name: Samantha Richards MRN: [id number] DOB: [birth year]   Cancelled Treatment:    Reason Eval/Treat Not Completed: Patient declined, no reason specified Pt declined attempt of OOB to chair and supine therex despite encouragement. Pt reported feeling too fatigued but has been working in [place] therex today. PT will continue to follow acutely.    Salina April, PTA Pager: [phone]   03/12/2017, 3:34 PM

## 2017-03-12 NOTE — Progress Notes (Signed)
PULMONARY / CRITICAL CARE MEDICINE   Name: Samantha Richards MRN: [id number] DOB: 13-Jun-1961    ADMISSION DATE:  02/16/2017 CONSULTATION DATE:  02/27/17  REFERRING MD:  Dr. Wynetta Emery  CHIEF COMPLAINT:  Hypotension  BRIEF SUMMARY:   Samantha Richards is a 56 y.o. female who presented to [facility] on 6/24 after a fall at home and was found to be in hemorrhagic/hypovolemic and cardiogenic shock. She was severely anemic to Hgb 3.7 in the setting of GI bleed. Requiring transfusions, pressors and BiPAP in ICU. She was also treated for cellulitis of the back. Levophed was weaned 6/28 and the patient was transferred to hospitalist service 6/29. Cardiology has been managing AFib with RVR with amiodarone gtt and biventricular heart failure R>L with lasix gtt. She had cardiogenic shock prompting transfer to [facility] on 7/4 for further evaluation by CHF team and initiation of milrinone and dopamine gtt. She also has anasarca and progressive renal failure with oliguria and hypotension.  Nephrology consulted, CVVHD initiated 7/6.    SUBJECTIVE:   No events overnight, N/V, was able to swallow some but N/V prevented further testing.  VITAL SIGNS: BP (!) 88/60   Pulse (!) 102   Temp 97.8 F (36.6 C) (Oral)   Resp 18   Ht 5\' 5"  (1.651 m)   Wt 71.1 kg (156 lb 12 oz)   LMP 01/16/2017 Comment: neg preg 02-16-2017  SpO2 100%   BMI 26.08 kg/m   HEMODYNAMICS: CVP:  [15 mmHg-19 mmHg] 15 mmHg  VENTILATOR SETTINGS:    INTAKE / OUTPUT: I/O last 3 completed shifts: In: 5979.6 [I.V.:5128.6; Blood:350; IV PIRJJOACZ:660] Out: 63016 [Urine:1; WFUXN:23557]  PHYSICAL EXAMINATION: General: Chronically ill appearing female, NAD HEENT: [place]/AT, PERRL, EOM-I and MMM PSY: Calm and appropriate Neuro: Awake and interactive, moving all ext to command CV: RRR, Nl S1/S2, -M/R/G. PULM: Bibasilar crackles GI: Soft, NT, ND and +BS, NGT in place Extremities: warm/dry, anasarca improving, tenderness at anterior left leg to palpation  but nothing palpable that would explain pain other than compression by the other leg. Skin: no rashes or lesions  LABS:  BMET  Recent Labs Lab 03/11/17 0406 03/11/17 1457 03/12/17 0413  NA 133* 131* 130*  K 4.0 3.8 3.7  CL 101 100* 99*  CO2 26 25 25   BUN 20 22* 24*  CREATININE 1.05* 1.07* 1.12*  GLUCOSE 168* 158* 147*   Electrolytes  Recent Labs Lab 03/10/17 0523  03/11/17 0406 03/11/17 1457 03/12/17 0413  CALCIUM 9.0  < > 8.9 9.2 8.9  MG 2.3  --  2.0  --  2.1  PHOS 2.3*  < > 2.0* 2.7 1.6*  < > = values in this interval not displayed. CBC  Recent Labs Lab 03/11/17 0406 03/11/17 2000 03/12/17 0413  WBC 29.4* 28.2* 26.3*  HGB 7.2* 7.6* 7.9*  HCT 24.4* 24.4* 25.6*  PLT 216 189 162   Coag's  Recent Labs Lab 03/10/17 0523 03/11/17 0406 03/12/17 0413  APTT 48* 46* 47*   Sepsis Markers No results for input(s): LATICACIDVEN, PROCALCITON, O2SATVEN in the last 168 hours. ABG  Recent Labs Lab 03/07/17 0500  PHART 7.463*  PCO2ART 39.7  PO2ART 61.9*   Liver Enzymes  Recent Labs Lab 03/10/17 0523  03/11/17 0406 03/11/17 1457 03/12/17 0413  AST 16  --   --   --   --   ALT 14  --   --   --   --   ALKPHOS 101  --   --   --   --  BILITOT 1.0  --   --   --   --   ALBUMIN 2.5*  < > 2.3* 2.3* 2.3*  < > = values in this interval not displayed. Cardiac Enzymes No results for input(s): TROPONINI, PROBNP in the last 168 hours.  Glucose  Recent Labs Lab 03/11/17 1157 03/11/17 1533 03/11/17 2000 03/11/17 2349 03/12/17 0420 03/12/17 0751  GLUCAP 155* 160* 157* 147* 151* 124*   Imaging No results found. STUDIES:  XR right Knee 6/24 >> No Acute CT abd/pelvis 6/25 >> anasarca with b/l pleural effusions, ascities. Fibroid uterus. Cardiomegaly with severe R atrial dilation. TTE 6/25 >> EF30-35%, diffuse hypokinesis, LA dilated, mild MR, RA mod dilated with signs of significant cor pulmonale. LE venous doppler 6/25 >> no DVT or SVT b/l US  Paracentesis 6/26 >> 2.5 L peritoneal fluid yielded, cytology negative EGD 6/28 >> intense distal esophagitis with focal hemorrhagic hemorrhage, small hiatal hernia CXR 7/5 >> Cardiomegaly with vascular congestion, R midlung atelectasis with LLL opacity.  CULTURES: MRSA PCR neg 6/24 Blood cultures 6/24 > 1 of 2 coag neg staph, likely contaminate Peritoneal fluid culture 6/26 > No growth 5 days, neg for AFB HIV 6/29 > negative  ANTIBIOTICS: Vancomycin 6/24 > 6/28 Rocephin 6/25 > 7/2 Keflex 7/2 > 7/4 Rocephin 7/6 >>7/12  SIGNIFICANT EVENTS: 6/24  Presents to [facility], admitted to ICU  6/29  Off vasopressors, transferred out to SDU 7/02  Lasix gtt initiated 7/04  Transferred to [facility]. Milrinone started 7/05  Dopamine started, transfer to ICU 7/06  Diffuse pain, requires pressor support with dopamine and a lasix drip.  Nausea.  CVVHD initiated.  7/07  Removed 2.3L in last 24 hours, remains positive balance.    LINES/TUBES: L IJ CVL 6/26 >> Foley 6/24 >> R IJ HD 7/6 >>   DISCUSSION: 55 y.o.femalewho presented after a fall at home found to be in hemorrhagic/hypovolemic with severe anemia and cardiogenic shock due to biventricular heart failure. Now with worsening anasarca and progressive renal failure with oliguria and hypotension.  CVVHD initiated 7/6.   Now w/ spont SDH w/ evidence of herniation but neuro-surg feels not clinically a concern w/ her current neuro exam   ASSESSMENT / PLAN:  NEUROLOGIC A:   Severe Deconditioning  Acute bilateral SDH L>R. Left to right shift w/ worsening uncal herniation.  Felt d/t heparin during CRRT per n-surg note. This is not common.  Being followed by n-surg  P:   - Diet as tolerated, partially passed the SLP but N/V prevented completion of test, will re-evaluate. - Keppra, maintain current dose - NS following and recommending conservative rx - Psych consult called to assess competency  PULMONARY A: Suspected OHS/OSA - intolerant of CPAP  while inpatient (may have been related to fluid status) Increasing aspiration risk  pcxr personally reviewed. No sig change  P:   - Wean O2 as able for sat of 88-92% - Monitor sats - Monitor for aspiration risk  CARDIOVASCULAR A:  Acute systolic heart failure with RV overload/cor pulmonale and massive anasarca Afib with RVR, improved Cardiogenic shock  P:  - Heart failure team following - CRRT for 1 more day per renal - Continue tele monitoring - Dobutamine restarted at 2.5 mcg - Dopamine and Milrinone off - Amio drip  RENAL A:   Acute Kidney Injury/ cardiorenal syndrome  Oliguria P:  - CRRT for 1 more days per renal, running negative 175 ml/hr - F/u PRN chem. - Replace electrolytes as indicated.  GASTROINTESTINAL A:  Upper GI bleed on EGD Severe protein-calorie malnutrition Hepatic steatosis Ascites > cytology negative Peritoneal mets vs uterine fibroids P:   - TPN given ileus - PPI  - SLP per speech, see above, will return in AM  HEMATOLOGIC A:   Anemia, likely multifactorial from GI bleed and chronic malnutrition hgb holding  P:  - Trend CBC - SCDs - Hold all anticoagulation  INFECTIOUS A:   Skin breakdown/wounds Cellulitis RLE Stage II Decubitus to Bilateral Buttock, RLE UTI  Off abx. No sig fever or change in leukocytosis  P:   - Cont wound care  - Trend fever and WBC curve  ENDOCRINE A:   Mild hyperglycemia Hypothyroid P:   - Repeat TSH 8/1 - Cont synthroid - CBG and SSI  Neuro: Leg pain, dilaudid PRN  FAMILY  - Updates: Palliative following.  Appreciate input.  Psych consult called to evaluate competency.   The patient is critically ill with multiple organ systems failure and requires high complexity decision making for assessment and support, frequent evaluation and titration of therapies, application of advanced monitoring technologies and extensive interpretation of multiple databases.   Critical Care Time devoted to  patient care services described in this note is  35  Minutes. This time reflects time of care of this signee Dr Jennet Maduro. This critical care time does not reflect procedure time, or teaching time or supervisory time of PA/NP/Med student/Med Resident etc but could involve care discussion time.  Rush Farmer, M.D. [facility] Pulmonary/Critical Care Medicine. Pager: [phone]. After hours pager: [phone].  03/12/2017, 10:26 AM

## 2017-03-12 NOTE — Progress Notes (Deleted)
Advanced Heart Failure Rounding Note  PCP:  Primary Cardiologist: Dr Acie Fredrickson   Subjective:    02/26/17 started on milrinone and placed on lasix drip.   Evening of 02/27/17 pts pressures fell requiring more dopamine, became less responsive and UOP worsening. Pt transferred to [place].   CVVHD started 7/6   On 7/14 developed severe HA. CT scan notable for SDH with mass effect. Heparin stopped. Seen by NSU who recommended conservative management.  On 7/15 developed some aphasia and had repeat CT which showed mild increase in midline shift. Continue medical management  Coox 66.3% on dobutamine 25 mcg/kg/min. Remains on CVVHD pulling 175 cc/hr.   Tired this am. Feeling Ok. Denies pain or SOB. Per renal plan to continue CVVHD at least one more day.   WBC 26.3k this am. Dr. Beryle Beams thinks possible from meningeal irritation  Out 3.9 L with CVVHD. Weight down another 9 lbs. (95 lbs total from highest weight this admission) CVP 15-16   Objective:   Weight Range: 156 lb 12 oz (71.1 kg) Body mass index is 26.08 kg/m.   Vital Signs:   Temp:  [97 F (36.1 C)-98.2 F (36.8 C)] 97.8 F (36.6 C) (07/18 0800) Pulse Rate:  [86-104] 86 (07/18 0800) Resp:  [18-32] 19 (07/18 0800) BP: (84-108)/(54-79) 99/72 (07/18 0800) SpO2:  [92 %-100 %] 99 % (07/18 0800) Weight:  [156 lb 12 oz (71.1 kg)] 156 lb 12 oz (71.1 kg) (07/18 0705) Last BM Date: 03/08/17  Weight change: Filed Weights   03/10/17 0500 03/11/17 0400 03/12/17 0705  Weight: 182 lb 1.6 oz (82.6 kg) 167 lb 12.3 oz (76.1 kg) 156 lb 12 oz (71.1 kg)    Intake/Output:   Intake/Output Summary (Last 24 hours) at 03/12/17 0836 Last data filed at 03/12/17 0800  Gross per 24 hour  Intake          4497.07 ml  Output             8118 ml  Net         -3620.93 ml     Physical Exam   CVP 14-15 General: Chronically ill appearing. NAD.  HEENT: Normal Neck: Supple. RIJ trialysis, cath LIJ TLC. Carotids 2+ bilat; no bruits. No  thyromegaly or nodule noted. Cor: PMI nondisplaced. Tachy, irregular. Lungs: Diminished basilar sounds.  Abdomen: Soft, non-tender, non-distended, no HSM. No bruits or masses. +BS  Extremities: No cyanosis, clubbing, or rash. 1-2+ edema.   Neuro: Alert & orientedx3, cranial nerves grossly intact. moves all 4 extremities w/o difficulty. Affect pleasant   Telemetry   Personally reviewed, A flutter 80-90s    EKG   N/A  Labs    CBC  Recent Labs  03/10/17 0523  03/11/17 2000 03/12/17 0413  WBC 32.3*  < > 28.2* 26.3*  NEUTROABS 29.4*  --   --   --   HGB 7.9*  < > 7.6* 7.9*  HCT 26.7*  < > 24.4* 25.6*  MCV 79.2  < > 79.5 79.8  PLT 240  < > 189 162  < > = values in this interval not displayed. Basic Metabolic Panel  Recent Labs  03/11/17 0406 03/11/17 1457 03/12/17 0413  NA 133* 131* 130*  K 4.0 3.8 3.7  CL 101 100* 99*  CO2 26 25 25   GLUCOSE 168* 158* 147*  BUN 20 22* 24*  CREATININE 1.05* 1.07* 1.12*  CALCIUM 8.9 9.2 8.9  MG 2.0  --  2.1  PHOS 2.0* 2.7 1.6*  Liver Function Tests  Recent Labs  03/10/17 0523  03/11/17 1457 03/12/17 0413  AST 16  --   --   --   ALT 14  --   --   --   ALKPHOS 101  --   --   --   BILITOT 1.0  --   --   --   PROT 6.1*  --   --   --   ALBUMIN 2.5*  < > 2.3* 2.3*  < > = values in this interval not displayed.   BNP (last 3 results)  Recent Labs  02/16/17 1516  BNP 923.6*       Imaging   Transthoracic Echocardiography Study Conclusions  - Left ventricle: Septal flattening consistant with elevated RV   pressures. Systolic function was moderately to severely reduced.   The estimated ejection fraction was in the range of 30% to 35%.   Diffuse hypokinesis. - Mitral valve: There was mild regurgitation. - Left atrium: The atrium was mildly dilated. - Right ventricle: The cavity size was moderately dilated. - Right atrium: The atrium was moderately dilated. - Atrial septum: No defect or patent foramen ovale was  identified. - Tricuspid valve: There was moderate-severe regurgitation. - Pericardium, extracardiac: A trivial pericardial effusion was   identified. - Impressions: Suspect PA pressure underestimated by TR velocity   due to RV dysfunction. There is moderate RV enlargement with   severe hypokinesis and signs of significant cor pulmonale.  Impressions:  - Suspect PA pressure underestimated by TR velocity due to RV   dysfunction. There is moderate RV enlargement with severe   hypokinesis and signs of significant cor pulmonale.     Medications:     Scheduled Medications: . chlorhexidine  15 mL Mouth Rinse BID  . Chlorhexidine Gluconate Cloth  6 each Topical Daily  . feeding supplement (ENSURE ENLIVE)  237 mL Oral BID BM  . feeding supplement (PRO-STAT SUGAR FREE 64)  60 mL Oral BID  . fluticasone  2 spray Each Nare Daily  . insulin aspart  0-15 Units Subcutaneous Q4H  . levothyroxine  50 mcg Intravenous Daily  . mouth rinse  15 mL Mouth Rinse q12n4p  . pantoprazole (PROTONIX) IV  40 mg Intravenous Q12H  . senna-docusate  1 tablet Oral BID  . sodium chloride flush  10-40 mL Intracatheter Q12H  . traMADol  50 mg Oral Q12H    Infusions: . Marland KitchenTPN (CLINIMIX-E) Adult    . sodium chloride 250 mL (03/11/17 1900)  . sodium chloride    . amiodarone 30 mg/hr (03/12/17 0003)  . DOBUTamine 2.5 mcg/kg/min (03/11/17 1900)  . levETIRAcetam Stopped (03/11/17 2300)  . dialysis replacement fluid (prismasate) 500 mL/hr at 03/11/17 2312  . dialysis replacement fluid (prismasate) 300 mL/hr at 03/12/17 0627  . dialysate (PRISMASATE) 1,000 mL/hr at 03/12/17 0627  . sodium phosphate  Dextrose 5% IVPB 30 mmol (03/12/17 0753)  . TPN (CLINIMIX) Adult without lytes 110 mL/hr at 03/11/17 1900    PRN Medications: sodium chloride, acetaminophen (TYLENOL) oral liquid 160 mg/5 mL, acetaminophen, alum & mag hydroxide-simeth, camphor-menthol, guaiFENesin-dextromethorphan, HYDROmorphone (DILAUDID)  injection, hydrOXYzine, iopamidol, lip balm, menthol-cetylpyridinium, ondansetron (ZOFRAN) IV, phenol, promethazine, promethazine, sodium chloride flush    Patient Profile   56 y/o woman with morbid obesity and little previous medical follow-up. Admitted 6/24 with severe anemia (hgb 3.7) and cardiogenic shock due biventricular HF R>L. Echo reviewed EF 30-35% with severe RV Failure and PAH. Has anasarca and progressive renal failure with inability to mobilize  fluid. Transferred to [facility] on 7/4 for further evaluation by CHF team.   Assessment/Plan   1. Acute severe biventricular systolic HF (R>L) - Echo 0/76/22 EF 30% with severe PAH and RV failure with D-shaped septum - Etiology of HF unclear DDx includes: tachy induced vs amyloid vs OHS.Severe malnutrition may also be playing a role.  Low volts on ECG concerning for amyloid. Creatinine too high at baseline for cMRI.  - Consider TPY scan when more stable.  - Milrinone stopped 03/09/17. Off dopamine. Coox 66.% this am on dobutamine 2.5 mcg/kg/min.   - CVP 14-15. Continue CVVHD per renal. Continue dobutamine for now.  - No beta blocker with acute decompensation.  - Eventually will need further w/u for biventricular CM if/when she is improved. Including VQ, possible L/R heart cath and sleep study. Remains too unstable for now.  - Suspect severe malnutrition may be key factor in her entire presentation. No change.   2. AKI  - remains on CVVHD. Weight shows down 95 lbs from highest weight this admission.  - CVP 14-15 elevated. Continue CVVHD. Appreciate renal input.  - Will try to wean dobutamine once off CVVHD.  4. Intracranial hemorrhage with SDH - developed on 7/13 - Heparin stopped. CTs reviewed personally. ? Some mild progression overnight radiographically - Exam stable this am. Denies HAs.   - Dr. Haroldine Laws spoke with NSU personally 03/09/17 and will continue conservative management given lack of neuro symptoms and location of bleed (which  leads to upward pressure on brain and lower risk of herniation) - All heparin on hold - Continue frequent neuro checks - Repeat imaging per Neuro as needed.  No change.   5. Iron deficiency anemia - Hgb on admit was 3.7. With low MCV. Iron stores low. - EGD 02/20/17 with severe esophagitis  - Received feraheme 02/26/2017.  - Hgb up to 7.9 after 1 uPRBC yesterday. She is off all heparin due to SDH - Will need colonoscopy when more stable. No change.   6. Atrial flutter - Rates controlled. Continue amio.   - Off heparin due to SDH so not candidate for DC-CV or possible ablation. No change.   7. Anasarca/ascites in setting of severe protein calorie malnutition - CT abdomen with hepatic stenosis.  - Likely due to combination of RHF and fatty liver. Albumin 2.7 02/18/17 - prealbumin was < 5 on 02/18/17. - Failed tube feeds. Intake remains poor. TPN started 7/14. No change.   8. Leukocytosis - Persistently elevated in 20-25K range. Yesterday up to 32k. 29K today. Has completed rocephin for a UTI.  - No obvious sites of infection.  PCT only 1.44 02/27/17.  - Seen by Dr. Beryle Beams on 7/9 and 03/11/17. Smear ok. No evidence of toxic process or malignancy at this point.  - Can proceed with further work up as needed.   9. Severe fibroid uterus  10. Morbid obesity - Nutrition following .   - Currently on TNA/TPN.   11. RLE wound - WOC has seen. Wounds improving. Heme does not think these are source of WBC.  No change.   12. Probable OSA/OHS - Bipap as needed. No change.   13-MASD (Moisture Associated Skin Damage) Buttock Wounds- Partial thickness- do not appear infected. Appears to be related to moisture and fecal incontinence.  Prealbumin <5.  Wound care appreciated.   Appreciate palliative care consult. Awaiting full consult (pt was asleep after dilaudid yesterday and unable to see)  Length of Stay: [address]  Shirley Friar, [place]  03/12/2017, 8:36 AM  Advanced Heart Failure  Team Pager [phone] (M-F; 7a - 4p)  Please contact [facility] for night-coverage after hours (4p -7a ) and weekends on [email or web address]

## 2017-03-12 NOTE — Progress Notes (Signed)
Chaplain stopped by to visit with patient.  Patient is on continual dialysis at this time per nurse and is wanting to talk about and process medical experience per nurse.  Chaplain called her name several times with no answer.  She is heavily sleeping per nurse.  Chaplain will check on patient Thursday.    Chaplain would like to thank medical team for their care for this patient.    03/12/17 1643  Clinical Encounter Type  Visited With Health care provider;Patient not available  Visit Type Initial;Spiritual support;Social support

## 2017-03-12 NOTE — Progress Notes (Signed)
PHARMACY - ADULT TOTAL PARENTERAL NUTRITION CONSULT NOTE   Pharmacy Consult:  TPN Indication: Intolerance to EN  Patient Measurements: Height: 5\' 5"  (165.1 cm) Weight: 156 lb 12 oz (71.1 kg) IBW/kg (Calculated) : 57 TPN AdjBW (KG): 61.8 Body mass index is 26.08 kg/m.  Assessment:  8 YOF with no PMH presented on 02/16/17 with symptomatic anemia s/p a mechanical fall 2 weeks prior and some bloody stool for 2-3 weeks.  EGD on 02/20/17 showed esophagitis with local hemorrhage, gastritis and hiatal hernia and a biopsy was also completed.  Hospital course complicated by cardiorenal syndrome.  Patient did not have anything to eat or drink for 3 days PTA because she was unable to move.  RD unable to assess malnutrition given that muscle/fat wasting could be masked by severe edema.  Patient had intermittent PO intake throughout hospitalization until 03/05/17 when TF was initiated.  On 03/07/17, patient requested feeding tube to be removed.  MD concerned with patient's prognosis if nutrition is inadequate; therefore, Pharmacy consulted to manage TPN.  Patient is nutritionally at risk.  GI: admitted with GIB, H.pylori negative.  LBM 7/14.  Prealbumin low at 5.6 - NG O/P 134mL, emesis x1 .  Refusing swallow eval and Cortrak placement.  PPI PO, PRN Zofran/Phenergan Endo: hypothyroid on Synthroid.  No hx DM - CBGs 124-158 Insulin requirements in the past 24 hours: 16 units SSI + 10 units insulin in TPN Lytes:  Phos 1.6- dec s/p NaPhos 74mmol total 48hr, Na 130 (dec last 2 days, 136 pre-tpn) No lytes: 7/14-7/17 Renal: AKI- anuric, CRRT 7/6 >> 7/14, restart 7/15 - SCr 1.05, BUN WNL.  Net -31.2L since admit.  Off pressors, considering transition to iHD.  Discussed with DrSanford, does not expect her to do this 7/18 & and ok to add electrolytes today. Pulm: cor pulmonale - stable on RA Cards: Afib/Aflutter - MAP 80-90s, CVP 20.   Amiodarone for AFib, Dobutamine started 7/17 Heme: acute subdural hematoma s/p  protamine 7/14, repeat CT showed midline shift - Hg ~8 stable, plts WNL Hepatobil: LFTs / tbili / TG WNL Neuro: Keppra x7 days for sz px.  Pain score 0-10, PRN APAP ID: s/p Vanc/CTX - afebrile, WBC 26.3- reactive 2nd SDH, trending down.    Best Practices: SCDs- pt refusing, CHG TPN Access: left-IJ triple lumen 02/18/17 (TPN administered via dialysis port per Renal) TPN start date: 03/08/17  Nutritional Goals (per RD recommendation on 7/1): 2000-2300 kCal and 125-150 gm protein per day  Current Nutrition:  NPO as of 7/15 Ensure Enlive (received 0 yesterday) Prostat 9mL BID (received 0 yesterday) TPN   Plan:  - Change to Clinimix-E 5/15 at 110 ml/hr, expect will need alternating regimen.  Clinimix will provide 1874 kCal, 132gm of protein and 2683mL of IVF per day.  Clinimix will meet 94% of kCal and 100% of protein needs (2.3 g/kg/d of protein while on CRRT). - Hold lipid for the first 7 days of TPN in ICU patients per ASPEN/SCCM guidelines (start date 03/15/17). -  MVI to TPN. - Trace elements in TPN every other day, next due 7/19 - Add thiamine 100mg  to TPN - Increase Insulin to 15 units to TPN - Repeat NaPhos 30 mmol IV x 1  - Moderate SSI Q4H - F/U AM labs, meds to IV - Patient's GI tract is intact.  Please re-attempt/encourage tube feeding as appropriate.    Lewie Chamber., PharmD Clinical Pharmacist [facility]

## 2017-03-12 NOTE — Plan of Care (Signed)
Problem: Tissue Perfusion: Goal: Risk factors for ineffective tissue perfusion will decrease Outcome: Not Progressing Patient refuses to wear sequential compression device and stockings

## 2017-03-12 NOTE — Consult Note (Signed)
Consultation Note Date: 03/12/2017   Patient Name: Samantha Richards  DOB: Apr 21, 1961  MRN: [id number]  Age / Sex: 56 y.o., female  PCP: Patient, No Pcp Per Referring Physician: Rush Farmer, MD  Reason for Consultation: Establishing goals of care  HPI/Patient Profile: 56 y.o. female  with past medical history of obesity but no other history as she has not been to any medical providers admitted on 02/16/2017 with severe anemia and dehydration with weakness/pain after lying on floor for 3 days. Hospitalization has been complicated by cellulitis of back, GIB with continued anemia, AF RVR requiring amiodarone gtt, biventricular heart failure and cardiogenic shock requiring dobutamine gtt (off vasopressors and Lasix gtt currently), followed by renal failure with CRRT since 02/28/17. Severely deconditioned and not improving/progressing so palliative care requested for assistance with GOC in setting of poor prognosis.   Clinical Assessment and Goals of Care: I met today with Samantha Richards at bedside. RN is only person at bedside. Samantha Richards is able to tell me about her doctors and generally about her health issues. I explained further the severity of her heart failure, renal failure, anemia, and brain bleed. She is severely deconditioned and not making much progress. Continues to require CRRT and dobutamine. I explained that her heart disease may limit her ability to tolerate dialysis (she talks about helping her mother who was on dialysis but is now deceased). Samantha Richards continues to hope and pray that she will have improvement in her health. We discuss barriers to improvement. I asked Samantha Richards if she has thought much about if her health continues to worsen. Eurika explains that she does think about this and that she tries to stay optimistic, she also says that she is spiritual and "if God decides to take me I'll be alright." Explained that the next step  would be to trial off CRRT and then if her heart can tolerate intermittent dialysis. Emotional support provided.  Laverne tells me that she was living with Samantha Richards who she designates as her main contact. She says that she cares for Samantha Richards's mother in exchange for residence. I voiced concern over Samantha Richards as a surrogate decision maker given her narrative about how he did not assist her when she was lying for 2-3 days after she fell. She tells me that "he is a good person." She also tells me that her parents and siblings have died and she has nieces/nephews but they are far away in other states. She says she will need to think further about who she would want as her surrogate/HCPOA.   Brit wants to continue full aggressive care and full code BUT would not want to linger on life support if she is not improving. We will continue to work to [employer] and [place]. I attempted to gentle let her know how critically ill she is and that prognosis is poor. Will continue to explain poor prognosis and provide support.   Primary Decision Maker Patient and for now Samantha Richards would be surrogate if she is unable to speak for herself.  He has been visiting but I have not met him or had a conversation with him.     SUMMARY OF RECOMMENDATIONS   - Continue full aggressive care for now - Next step would be to transition off CRRT which I will guide our conversations based on her response off CRRT  Code Status/Advance Care Planning:  Full code   Symptom Management:   Leg pain: Dopplers negative for DVT. Likely r/t immobility and anasarca. Dilaudid 0.5 mg every 4 hours prn. Not taking po meds.   Nausea: Relieved by Zofran prn but recurrent. This has limited SLP evaluation. Consider premedication for swallow eval.   Malnutrition: Currently on TPN - apparently enteral feeding made her nauseated. Difficult situation.   Palliative Prophylaxis:   Aspiration, Bowel Regimen, Delirium Protocol, Frequent Pain Assessment,  Oral Care, Palliative Wound Care and Turn Reposition  Additional Recommendations (Limitations, Scope, Preferences):  Full Scope Treatment  Psycho-social/Spiritual:   Desire for further Chaplaincy support:yes  Additional Recommendations: Grief/Bereavement Support and Referral to [employer]   Prognosis:   Prognosis very poor but difficult to determine time frame given desire for continued full aggressive care.   Discharge Planning: To Be Determined      Primary Diagnoses: Present on Admission: . GI bleed . Acute blood loss anemia . Ascites . Symptomatic anemia . Acute on chronic combined systolic and diastolic CHF (congestive heart failure) ([place]) . Atypical atrial flutter ([place]) . Cardiogenic shock ([place]) . Severe protein-calorie malnutrition ([place]) . Hypothyroidism   I have reviewed the medical record, interviewed the patient and family, and examined the patient. The following aspects are pertinent.  History reviewed. No pertinent past medical history. Social History   Social History  . Marital status: Single    Spouse name: N/A  . Number of children: N/A  . Years of education: N/A   Social History Main Topics  . Smoking status: Never Smoker  . Smokeless tobacco: Never Used  . Alcohol use No  . Drug use: No  . Sexual activity: Not Asked   Other Topics Concern  . None   Social History Narrative  . None   Family History  Problem Relation Age of Onset  . Diabetes Mother   . Hypertension Mother   . Heart disease Father   . Diabetes Sister    Scheduled Meds: . chlorhexidine  15 mL Mouth Rinse BID  . Chlorhexidine Gluconate Cloth  6 each Topical Daily  . feeding supplement (ENSURE ENLIVE)  237 mL Oral BID BM  . feeding supplement (PRO-STAT SUGAR FREE 64)  60 mL Oral BID  . fluticasone  2 spray Each Nare Daily  . insulin aspart  0-15 Units Subcutaneous Q4H  . levothyroxine  50 mcg Intravenous Daily  . mouth rinse  15 mL Mouth Rinse q12n4p  .  pantoprazole (PROTONIX) IV  40 mg Intravenous Q12H  . senna-docusate  1 tablet Oral BID  . sodium chloride flush  10-40 mL Intracatheter Q12H  . traMADol  50 mg Oral Q12H   Continuous Infusions: . Marland KitchenTPN (CLINIMIX-E) Adult    . sodium chloride 250 mL (03/11/17 1900)  . sodium chloride    . amiodarone 30 mg/hr (03/12/17 0003)  . DOBUTamine 2.5 mcg/kg/min (03/11/17 1900)  . levETIRAcetam Stopped (03/11/17 2300)  . dialysis replacement fluid (prismasate) 500 mL/hr at 03/12/17 0950  . dialysis replacement fluid (prismasate) 300 mL/hr at 03/12/17 0627  . dialysate (PRISMASATE) 1,000 mL/hr at 03/12/17 0627  . sodium phosphate  Dextrose 5% IVPB 30 mmol (  03/12/17 0753)  . TPN (CLINIMIX) Adult without lytes 110 mL/hr at 03/11/17 1900   PRN Meds:.sodium chloride, acetaminophen (TYLENOL) oral liquid 160 mg/5 mL, acetaminophen, alum & mag hydroxide-simeth, camphor-menthol, guaiFENesin-dextromethorphan, HYDROmorphone (DILAUDID) injection, hydrOXYzine, iopamidol, lip balm, menthol-cetylpyridinium, ondansetron (ZOFRAN) IV, phenol, promethazine, promethazine, sodium chloride flush Allergies  Allergen Reactions  . Gentamicin Itching and Rash   Review of Systems  Constitutional: Positive for activity change, appetite change and fatigue.  Respiratory: Positive for shortness of breath.   Cardiovascular: Positive for leg swelling.  Gastrointestinal: Positive for nausea.  Neurological: Positive for weakness.    Physical Exam  Constitutional: She is oriented to person, place, and time. She appears well-developed. She has a sickly appearance.  Frail, severely deconditioned  Cardiovascular: Normal rate.  An irregularly irregular rhythm present.  Pulmonary/Chest: Effort normal. No accessory muscle usage. No tachypnea. No respiratory distress. She has rales in the right lower field and the left lower field.  Abdominal: Soft. She exhibits ascites.  Neurological: She is alert and oriented to person, place, and  time.  Psychiatric: She has a normal mood and affect. Her speech is normal and behavior is normal. Judgment and thought content normal.  Occasional delay in word searching but rare in conversation. Appropriately tearful for our conversation.   Nursing note and vitals reviewed.   Vital Signs: BP (!) 84/62   Pulse 90   Temp 97.8 F (36.6 C) (Oral)   Resp (!) 21   Ht 5' 5"  (1.651 m)   Wt 71.1 kg (156 lb 12 oz)   LMP 01/16/2017 Comment: neg preg 02-16-2017  SpO2 99%   BMI 26.08 kg/m  Pain Assessment: No/denies pain POSS *See Group Information*: 2-Acceptable,Slightly drowsy, easily aroused Pain Score: 5    SpO2: SpO2: 99 % O2 Device:SpO2: 99 % O2 Flow Rate: .O2 Flow Rate (L/min): 2 L/min  IO: Intake/output summary:  Intake/Output Summary (Last 24 hours) at 03/12/17 1144 Last data filed at 03/12/17 1100  Gross per 24 hour  Intake          4307.77 ml  Output             8105 ml  Net         -3797.23 ml    LBM: Last BM Date: 03/08/17 Baseline Weight: Weight: 107.8 kg (237 lb 10.5 oz) Most recent weight: Weight: 71.1 kg (156 lb 12 oz)     Palliative Assessment/Data: 20%     Time Total: 73mn  Greater than 50%  of this time was spent counseling and coordinating care related to the above assessment and plan.  Signed by: AVinie Sill NP Palliative Medicine Team Pager # [phone](M-F 8a-5p) Team Phone # [phone](Nights/Weekends)

## 2017-03-13 LAB — COMPREHENSIVE METABOLIC PANEL
ALBUMIN: 2.2 g/dL — AB (ref 3.5–5.0)
ALK PHOS: 107 U/L (ref 38–126)
ALT: 12 U/L — AB (ref 14–54)
AST: 14 U/L — AB (ref 15–41)
Anion gap: 6 (ref 5–15)
BILIRUBIN TOTAL: 1 mg/dL (ref 0.3–1.2)
BUN: 30 mg/dL — AB (ref 6–20)
CALCIUM: 8.7 mg/dL — AB (ref 8.9–10.3)
CO2: 26 mmol/L (ref 22–32)
CREATININE: 1.26 mg/dL — AB (ref 0.44–1.00)
Chloride: 100 mmol/L — ABNORMAL LOW (ref 101–111)
GFR calc Af Amer: 55 mL/min — ABNORMAL LOW (ref >60)
GFR, EST NON AFRICAN AMERICAN: 48 mL/min — AB (ref >60)
Glucose, Bld: 143 mg/dL — ABNORMAL HIGH (ref 65–99)
Potassium: 4.2 mmol/L (ref 3.5–5.1)
Sodium: 132 mmol/L — ABNORMAL LOW (ref 135–145)
TOTAL PROTEIN: 5.6 g/dL — AB (ref 6.5–8.1)

## 2017-03-13 LAB — CBC
HEMATOCRIT: 24.9 % — AB (ref 36.0–46.0)
HEMOGLOBIN: 7.5 g/dL — AB (ref 12.0–15.0)
MCH: 24 pg — ABNORMAL LOW (ref 26.0–34.0)
MCHC: 30.1 g/dL (ref 30.0–36.0)
MCV: 79.6 fL (ref 78.0–100.0)
Platelets: 160 10*3/uL (ref 150–400)
RBC: 3.13 MIL/uL — ABNORMAL LOW (ref 3.87–5.11)
RDW: 29.6 % — AB (ref 11.5–15.5)
WBC: 19.9 10*3/uL — AB (ref 4.0–10.5)

## 2017-03-13 LAB — GLUCOSE, CAPILLARY
GLUCOSE-CAPILLARY: 138 mg/dL — AB (ref 65–99)
GLUCOSE-CAPILLARY: 147 mg/dL — AB (ref 65–99)
Glucose-Capillary: 109 mg/dL — ABNORMAL HIGH (ref 65–99)

## 2017-03-13 LAB — COOXEMETRY PANEL
Carboxyhemoglobin: 1.5 % (ref 0.5–1.5)
Methemoglobin: 0.9 % (ref 0.0–1.5)
O2 SAT: 58 %
Total hemoglobin: 7.2 g/dL — ABNORMAL LOW (ref 12.0–16.0)

## 2017-03-13 LAB — APTT: aPTT: 43 seconds — ABNORMAL HIGH (ref 24–36)

## 2017-03-13 LAB — MAGNESIUM: MAGNESIUM: 2.2 mg/dL (ref 1.7–2.4)

## 2017-03-13 LAB — PHOSPHORUS: PHOSPHORUS: 2.7 mg/dL (ref 2.5–4.6)

## 2017-03-13 MED ORDER — LORAZEPAM 2 MG/ML IJ SOLN: 0.5000 mg | INTRAMUSCULAR | Status: DC | PRN

## 2017-03-13 MED ORDER — CAMPHOR-MENTHOL 0.5-0.5 % EX LOTN: TOPICAL_LOTION | CUTANEOUS | 0 refills | Status: AC | PRN

## 2017-03-13 MED ORDER — ACETAMINOPHEN 160 MG/5ML PO SOLN: 650.0000 mg | Freq: Four times a day (QID) | ORAL | Status: DC | PRN

## 2017-03-13 MED ORDER — GLYCOPYRROLATE 0.2 MG/ML IJ SOLN: 0.2000 mg | INTRAMUSCULAR | Status: DC | PRN

## 2017-03-13 MED ORDER — ACETAMINOPHEN 325 MG RE SUPP: 325.0000 mg | Freq: Four times a day (QID) | RECTAL | 0 refills | Status: AC | PRN

## 2017-03-13 MED ORDER — ACETAMINOPHEN 325 MG RE SUPP: 325.0000 mg | Freq: Four times a day (QID) | RECTAL | Status: DC | PRN

## 2017-03-13 MED ORDER — HYDROMORPHONE HCL 1 MG/ML IJ SOLN: 0.5000 mg | INTRAMUSCULAR | Status: DC | PRN

## 2017-03-13 MED ORDER — HEPARIN SODIUM (PORCINE) 1000 UNIT/ML IJ SOLN
3000.0000 [IU] | INTRAMUSCULAR | Status: DC | PRN
  Administered 2017-03-13: 2400 [IU] via INTRAVENOUS
  Filled 2017-03-13: qty 3

## 2017-03-13 NOTE — Care Management Note (Addendum)
Case Management Note Previous CM note initiated by Maryclare Labrador, RN 02/27/2017, 3:17 PM   Patient Details  Name: Samantha Richards MRN: [id number] Date of Birth: 14-Mar-1961  Subjective/Objective:   Pt admitted with HF   - [facility] with HF team  - on lasix and milrinone              Action/Plan:  PTA from home, recommendation is for SNF - CSW consulted   Expected Discharge Date:  02/26/17               Expected Discharge Plan:  [place] (CSW consulted )  In-House Referral:  Clinical Social Work  Discharge planning Services  CM Consult  Post Acute Care Choice:  NA Choice offered to:  NA  DME Arranged:    DME Agency:     HH Arranged:    [place] Agency:     Status of Service:  In process, will continue to follow  If discussed at Long Length of Stay Meetings, dates discussed:  7/19  Discharge Disposition: [place]   Additional Comments:  03/13/17- 1200- Marvetta Gibbons RN, CM- noted pt's request for full comfort care- CM remains available if needed. PC following Per CSW plan for [facility]- bed available today  03/11/17- 1055- Marvetta Gibbons Therapist, sports,. CM- pt continues on CVVHD- per MD note- Acute bilateral SDH L>R. Left to right shift w/ worsening uncal herniation.Felt d/t heparin during CRRT per n-surg note.  -Being followed by n-surg -- continues on TPN- CSW working with pt to locate family- at this time pt states that she does not have family supports - CSW continues to follow for support contacts- CM will follow along with CSW   Dawayne Patricia, [place] 03/13/2017, 12:12 PM [phone]

## 2017-03-13 NOTE — Progress Notes (Signed)
Patient will discharge to [facility] Anticipated discharge date: 7/19 Family notified: left message for [employer] by Corey Harold- scheduled for 4:15pm Report #: [phone]  [place] signing off.  Jorge Ny, LCSW Clinical Social Worker [phone]

## 2017-03-13 NOTE — Progress Notes (Signed)
Filter clotted at 0645. MD Joelyn Oms called to ask if he wanted RN to start with new filter considering CRRT may be stopped today. MD Joelyn Oms told RN to wait on restarting and he will be by shortly to round. RN will continue to monitor.

## 2017-03-13 NOTE — Progress Notes (Signed)
Report called to [facility]. Pt asked to be taken off all monitors. RN will continue to monitor.

## 2017-03-13 NOTE — Progress Notes (Addendum)
  Pt has requested transition to comfort care this am.  Feeling worse.  Tired. Leg pain has improved.  She would not like to be intubated, and declines chest compressions. Appropriate changes made to chart.    Will stop amiodarone drip.  Will continue dobutamine until comfort meds in place, and then discontinue.   Have asked palliative to see at their earliest convenience this am for assisting in full transition to comfort care.  Please see HF team full note for further.   Legrand Como [address]" [place], PA-C 03/13/2017 8:34 AM

## 2017-03-13 NOTE — Progress Notes (Signed)
CSW consulted for hospice placement- CSW met with pt and confirmed interest in transitioning to hospice care.  Pt prefers [facility] as first choice- CSW sent referral to [facility] liaison to review- awaiting determination   CSW will continue to follow  Jorge Ny, [place] Social Worker [phone]

## 2017-03-13 NOTE — Discharge Summary (Signed)
Physician Discharge Summary  Patient ID: Janecia Palau MRN: [id number] DOB/AGE: 04-14-1961 56 y.o.  Admit date: 02/16/2017 Discharge date: 03/13/2017    Discharge Diagnoses:  GI Bleeding  Iron Deficiency Anemia  Hemorrhagic / Hypovolemic Shock  Acute Severe Biventricular Systolic Heart Failure  Atrial Fibrillation with RVR  Acute Renal Failure  Intracranial Hemorrhage / SDH  Anasarca / Ascites (cytology negative) Severe Protein Calorie Malnutrition  Hepatic Steatosis  Leukocytosis  UTI Severe Fibroid Uterus vs Peritoneal Metastasis Obesity  Hypothyroidism  Suspected OHS / OSA  Moisture Associated Skin Damage / Buttock Wounds Severe Deconditioning                                                                      DISCHARGE PLAN BY DIAGNOSIS      GI Bleeding  Iron Deficiency Anemia  Hemorrhagic / Hypovolemic Shock  Acute Severe Biventricular Systolic Heart Failure  Atrial Fibrillation with RVR  Acute Renal Failure  Intracranial Hemorrhage / SDH  Anasarca / Ascites (cytology negative) Severe Protein Calorie Malnutrition  Hepatic Steatosis  Leukocytosis  UTI Severe Fibroid Uterus vs Peritoneal Metastasis Obesity  Hypothyroidism  Suspected OHS / OSA  Moisture Associated Skin Damage / Buttock Wounds Severe Deconditioning   Discharge Plan: Discharge to [facility]  Discussed case with Hospice RN > no meds to be prescribed as [facility] will manage medications > will use ativan PRN for seizure.  Hold further Keppra for now.  Continue any and all measures that promote comfort                      DISCHARGE SUMMARY    56 y/o F who initially was admitted to [facility] on 6/24 after a fall at home where she laid for several days.  Prior to admit, she did not have regular medical care.  Her parents are deceased and she has no family in [place].  On presentation to the ER she was found to be in hemorrhagic / hypovolemic and cardiogenic shock.   She was profoundly anemic with a Hgb of 3.7 in the setting of a GI bleed.  She required volume resuscitation with PRBC, IVF, vasopressors and BiPAP.  She had profound anasarca and cellulitis of the back in the setting of severe malnutrition.  In addition, her course was complicated by SDH with mass effect on heparin gtt, AF with RVR and biventricular heart failure (R>L).  She was treated with IV lasix gtt, amiodarone and ultimately transferred to [facility] for evaluation of CHF.  She was further treated with milrinone and dopamine.  Due to progressive renal failure, she required CVVHD.  The patient ultimately did not improve and she made the decision to end therapies.  She was evaluated by Hospice and recommended for [facility] inpatient hospice.  Patient medically cleared for discharge 7/19  as above.    STUDIES:  XR right Knee 6/24 >> No Acute CT abd/pelvis 6/25 >> anasarca with b/l pleural effusions, ascities. Fibroid uterus. Cardiomegaly with severe R atrial dilation. TTE 6/25 >> EF30-35%, diffuse hypokinesis, LA dilated, mild MR, RA mod dilated with signs of significant cor pulmonale. LE venous doppler 6/25 >> no DVT or SVT b/l US Paracentesis 6/26 >> 2.5 L peritoneal  fluid yielded, cytology negative EGD 6/28 >> intense distal esophagitis with focal hemorrhagic hemorrhage, small hiatal hernia CXR 7/5 >> Cardiomegaly with vascular congestion, R midlung atelectasis with LLL opacity.  CULTURES: MRSA PCR neg 6/24 Blood cultures 6/24 > 1 of 2 coag neg staph, likely contaminate Peritoneal fluid culture 6/26 > No growth 5 days, neg for AFB HIV 6/29 > negative  ANTIBIOTICS: Vancomycin 6/24 > 6/28 Rocephin 6/25 > 7/2 Keflex 7/2 > 7/4 Rocephin 7/6 >>7/12  SIGNIFICANT EVENTS: 6/24 Presents to [facility], admitted to ICU  6/29 Off vasopressors, transferred out to SDU 7/02 Lasix gtt initiated 7/04  Transferred to [facility]. Milrinone started 7/05  Dopamine started, transfer to ICU 7/06  Diffuse  pain, requires pressor support with dopamine and a lasix drip.  Nausea.  CVVHD initiated.  7/07  Removed 2.3L in last 24 hours, remains positive balance.   7/19  Pt requested to stop all therapies   LINES/TUBES: L IJ CVL 6/26 >> 7/19 Foley 6/24 >>  R IJ HD 7/6 >> 7/19  Discharge Exam: General: chronically ill appearing female in NAD HEENT: MM pink/moist Neuro: AAOx4 CV: s1s2 irr, no m/r/g PULM: even/non-labored, lungs bilaterally clear KV:[id number], non-tender, bsx4 active  Extremities: warm/dry, generalized edema  Skin: no rashes or lesions   Vitals:   03/13/17 1222 03/13/17 1225 03/13/17 1300 03/13/17 1400  BP:  (!) 80/57    Pulse: 94     Resp: (!) 23 (!) 27 (!) 23 (!) 24  Temp: 98 F (36.7 C)     TempSrc: Oral     SpO2: 98%     Weight:      Height:         Discharge Labs  BMET  Recent Labs Lab 03/09/17 0457  03/10/17 0523  03/11/17 0406 03/11/17 1457 03/12/17 0413 03/12/17 1600 03/13/17 0448  NA 135  < > 133*  < > 133* 131* 130* 131* 132*  K 4.3  < > 4.1  < > 4.0 3.8 3.7 3.7 4.2  CL 102  < > 100*  < > 101 100* 99* 99* 100*  CO2 26  < > 27  < > 26 25 25 25 26   GLUCOSE 145*  < > 153*  < > 168* 158* 147* 140* 143*  BUN 15  < > 16  < > 20 22* 24* 27* 30*  CREATININE 1.47*  < > 1.21*  < > 1.05* 1.07* 1.12* 1.15* 1.26*  CALCIUM 9.1  < > 9.0  < > 8.9 9.2 8.9 8.9 8.7*  MG 2.3  --  2.3  --  2.0  --  2.1  --  2.2  PHOS 3.6  < > 2.3*  < > 2.0* 2.7 1.6* 2.9 2.7  < > = values in this interval not displayed.  CBC  Recent Labs Lab 03/11/17 2000 03/12/17 0413 03/13/17 0448  HGB 7.6* 7.9* 7.5*  HCT 24.4* 25.6* 24.9*  WBC 28.2* 26.3* 19.9*  PLT 189 162 160    Follow-up Information    [facility] Follow up.   Specialty:  Hospice and Palliative Medicine Contact information: [place] [address] [phone]            Allergies as of 03/13/2017      Reactions   Gentamicin Itching, Rash      Medication List    TAKE  these medications   acetaminophen 325 MG suppository Commonly known as:  TYLENOL Place 1 suppository (325 mg total) rectally every 6 (  six) hours as needed for fever (headache).   camphor-menthol lotion Commonly known as:  SARNA Apply topically as needed for itching.       Continue Morphine / Ativan as needed for comfort.  Defer further management of symptoms to Hospice.   Disposition: [facility]  Discharged Condition: Koralynn Greenspan has met maximum benefit of inpatient care and is medically stable and cleared for discharge.  Patient is pending follow up as above.      Time spent on disposition:  35 Minutes.   Signed: [name], NP-C [facility] Pgr: [phone] Office: [phone]

## 2017-03-13 NOTE — Procedures (Signed)
Admit: 02/16/2017 LOS: 81  39F with anuric AKI llikely 2/2 ATN and Cardiorenal, hypervolemia, b/l systolic HF, recent severe GIB, b/l SDH   Current CRRT Prescription: Sart Date: 02/28/17 Catheter: R IJ TDC BFR: 200 Pre Blood Pump: 500 4K DFR: 1000 4K Replacement Rate: 300 4K Goal UF: net neg 175mL/hr Anticoagulation: none, hx/o SDH on heparin Clotting: ~q18h    S: Filter clotted this AM Discussed with pt hthat she has been on CRRT nearly 2wk, little improvement, and I don't see very likely she will tolerate iHD as inpatient and certianly not as outpatient. Explored goals of care, she expressed desire to stop all RRT.  SHe acknowledged that it will be fatal over days.  She accepted this clearly.  I let her know if she is stopping HD she should move towards full palliative measures, she did not object.  O: 07/18 0701 - 07/19 0700 In: 3805.8 [I.V.:3440.8; IV Piggyback:365] Out: 1696 [Urine:10]  Filed Weights   03/11/17 0400 03/12/17 0705 03/13/17 0700  Weight: 76.1 kg (167 lb 12.3 oz) 71.1 kg (156 lb 12 oz) 66.9 kg (147 lb 7.8 oz)     Recent Labs Lab 03/12/17 0413 03/12/17 1600 03/13/17 0448  NA 130* 131* 132*  K 3.7 3.7 4.2  CL 99* 99* 100*  CO2 25 25 26   GLUCOSE 147* 140* 143*  BUN 24* 27* 30*  CREATININE 1.12* 1.15* 1.26*  CALCIUM 8.9 8.9 8.7*  PHOS 1.6* 2.9 2.7    Recent Labs Lab 03/10/17 0523  03/11/17 2000 03/12/17 0413 03/13/17 0448  WBC 32.3*  < > 28.2* 26.3* 19.9*  NEUTROABS 29.4*  --   --   --   --   HGB 7.9*  < > 7.6* 7.9* 7.5*  HCT 26.7*  < > 24.4* 25.6* 24.9*  MCV 79.2  < > 79.5 79.8 79.6  PLT 240  < > 189 162 160  < > = values in this interval not displayed.  Scheduled Meds: . chlorhexidine  15 mL Mouth Rinse BID  . Chlorhexidine Gluconate Cloth  6 each Topical Daily  . feeding supplement (ENSURE ENLIVE)  237 mL Oral BID BM  . feeding supplement (PRO-STAT SUGAR FREE 64)  60 mL Oral BID  . fluticasone  2 spray Each Nare Daily  . insulin  aspart  0-15 Units Subcutaneous Q4H  . levothyroxine  50 mcg Intravenous Daily  . mouth rinse  15 mL Mouth Rinse q12n4p  . pantoprazole (PROTONIX) IV  40 mg Intravenous Q12H  . senna-docusate  1 tablet Oral BID  . sodium chloride flush  10-40 mL Intracatheter Q12H  . traMADol  50 mg Oral Q12H   Continuous Infusions: . Marland KitchenTPN (CLINIMIX-E) Adult 110 mL/hr at 03/12/17 1718  . sodium chloride 250 mL (03/11/17 1900)  . sodium chloride    . amiodarone 30 mg/hr (03/13/17 0002)  . DOBUTamine 2.5 mcg/kg/min (03/11/17 1900)  . levETIRAcetam Stopped (03/12/17 2220)  . dialysis replacement fluid (prismasate) 500 mL/hr at 03/13/17 0322  . dialysis replacement fluid (prismasate) 300 mL/hr at 03/12/17 0627  . dialysate (PRISMASATE) 1,000 mL/hr at 03/13/17 0612   PRN Meds:.sodium chloride, acetaminophen **OR** acetaminophen (TYLENOL) oral liquid 160 mg/5 mL, alum & mag hydroxide-simeth, camphor-menthol, guaiFENesin-dextromethorphan, heparin, HYDROmorphone (DILAUDID) injection, hydrOXYzine, iopamidol, lip balm, menthol-cetylpyridinium, ondansetron (ZOFRAN) IV, phenol, promethazine, promethazine, sodium chloride flush  ABG    Component Value Date/Time   PHART 7.463 (H) 03/07/2017 0500   PCO2ART 39.7 03/07/2017 0500   PO2ART 61.9 (L) 03/07/2017 0500  HCO3 28.0 03/07/2017 0500   ACIDBASEDEF 4.3 (H) 02/28/2017 0100   O2SAT 58.0 03/13/2017 0435    ASSESSMENT 1. Dialysis dependent AKI, anuric,  Admission SCr 2; likely ATN/Cardiorenal; No progress in recovery of GFR or ability to transition to iHD 2/2 hemodynamics 2. BiV sCHF, AHF following, on dobutamine,  3. B/l SDH, off heparin, NSG following 4. Afib on amio 5. Leukocytosis, reactive 6. OSA/OHS  PLAN Will not resume RRT. She understands this is fatal. Discussed with AHF, likely to transition to full palliative measures.   Pearson Grippe, MD [facility] pgr [phone]

## 2017-03-13 NOTE — Progress Notes (Signed)
PULMONARY / CRITICAL CARE MEDICINE   Name: Samantha Richards MRN: [id number] DOB: 02-19-1961    ADMISSION DATE:  02/16/2017 CONSULTATION DATE:  02/27/17  REFERRING MD:  Dr. Wynetta Emery  CHIEF COMPLAINT:  Hypotension  BRIEF SUMMARY:   Samantha Richards is a 56 y.o. female who presented to [facility] on 6/24 after a fall at home and was found to be in hemorrhagic/hypovolemic and cardiogenic shock. She was severely anemic to Hgb 3.7 in the setting of GI bleed. Requiring transfusions, pressors and BiPAP in ICU. She was also treated for cellulitis of the back. Levophed was weaned 6/28 and the patient was transferred to hospitalist service 6/29. Cardiology has been managing AFib with RVR with amiodarone gtt and biventricular heart failure R>L with lasix gtt. She had cardiogenic shock prompting transfer to [facility] on 7/4 for further evaluation by CHF team and initiation of milrinone and dopamine gtt. She also has anasarca and progressive renal failure with oliguria and hypotension.  Nephrology consulted, CVVHD initiated 7/6.    SUBJECTIVE:   Considering comfort care cvvhd on going dobutamine   VITAL SIGNS: BP 90/60   Pulse 90   Temp 98.6 F (37 C) (Oral)   Resp (!) 23   Ht 5\' 5"  (1.651 m)   Wt 66.9 kg (147 lb 7.8 oz)   LMP 01/16/2017 Comment: neg preg 02-16-2017  SpO2 99%   BMI 24.54 kg/m   HEMODYNAMICS: CVP:  [11 mmHg-14 mmHg] 13 mmHg  VENTILATOR SETTINGS:    INTAKE / OUTPUT: I/O last 3 completed shifts: In: 5750.6 [I.V.:5385.6; IV Piggyback:365] Out: 10926 [Urine:10; LDJTT:01779]  PHYSICAL EXAMINATION: General: awake alert Neuro: O x 4, nonfocal HEENT: no jvd PULM: cTA CV:  s1 s2 irr flutter GI: soft, distended, no r/g, full  Extremities: edema  LABS:  BMET  Recent Labs Lab 03/12/17 0413 03/12/17 1600 03/13/17 0448  NA 130* 131* 132*  K 3.7 3.7 4.2  CL 99* 99* 100*  CO2 25 25 26   BUN 24* 27* 30*  CREATININE 1.12* 1.15* 1.26*  GLUCOSE 147* 140* 143*    Electrolytes  Recent Labs Lab 03/11/17 0406  03/12/17 0413 03/12/17 1600 03/13/17 0448  CALCIUM 8.9  < > 8.9 8.9 8.7*  MG 2.0  --  2.1  --  2.2  PHOS 2.0*  < > 1.6* 2.9 2.7  < > = values in this interval not displayed. CBC  Recent Labs Lab 03/11/17 2000 03/12/17 0413 03/13/17 0448  WBC 28.2* 26.3* 19.9*  HGB 7.6* 7.9* 7.5*  HCT 24.4* 25.6* 24.9*  PLT 189 162 160   Coag's  Recent Labs Lab 03/11/17 0406 03/12/17 0413 03/13/17 0448  APTT 46* 47* 43*   Sepsis Markers No results for input(s): LATICACIDVEN, PROCALCITON, O2SATVEN in the last 168 hours. ABG  Recent Labs Lab 03/07/17 0500  PHART 7.463*  PCO2ART 39.7  PO2ART 61.9*   Liver Enzymes  Recent Labs Lab 03/10/17 0523  03/12/17 0413 03/12/17 1600 03/13/17 0448  AST 16  --   --   --  14*  ALT 14  --   --   --  12*  ALKPHOS 101  --   --   --  107  BILITOT 1.0  --   --   --  1.0  ALBUMIN 2.5*  < > 2.3* 2.2* 2.2*  < > = values in this interval not displayed. Cardiac Enzymes No results for input(s): TROPONINI, PROBNP in the last 168 hours.  Glucose  Recent Labs Lab 03/12/17 1153 03/12/17  1539 03/12/17 2037 03/12/17 2339 03/13/17 0433 03/13/17 0802  GLUCAP 149* 144* 152* 125* 138* 147*   Imaging No results found. STUDIES:  XR right Knee 6/24 >> No Acute CT abd/pelvis 6/25 >> anasarca with b/l pleural effusions, ascities. Fibroid uterus. Cardiomegaly with severe R atrial dilation. TTE 6/25 >> EF30-35%, diffuse hypokinesis, LA dilated, mild MR, RA mod dilated with signs of significant cor pulmonale. LE venous doppler 6/25 >> no DVT or SVT b/l US Paracentesis 6/26 >> 2.5 L peritoneal fluid yielded, cytology negative EGD 6/28 >> intense distal esophagitis with focal hemorrhagic hemorrhage, small hiatal hernia CXR 7/5 >> Cardiomegaly with vascular congestion, R midlung atelectasis with LLL opacity.  CULTURES: MRSA PCR neg 6/24 Blood cultures 6/24 > 1 of 2 coag neg staph, likely  contaminate Peritoneal fluid culture 6/26 > No growth 5 days, neg for AFB HIV 6/29 > negative  ANTIBIOTICS: Vancomycin 6/24 > 6/28 Rocephin 6/25 > 7/2 Keflex 7/2 > 7/4 Rocephin 7/6 >>7/12  SIGNIFICANT EVENTS: 6/24  Presents to [facility], admitted to ICU  6/29  Off vasopressors, transferred out to SDU 7/02  Lasix gtt initiated 7/04  Transferred to [facility] Milrinone started 7/05  Dopamine started, transfer to ICU 7/06  Diffuse pain, requires pressor support with dopamine and a lasix drip.  Nausea.  CVVHD initiated.  7/07  Removed 2.3L in last 24 hours, remains positive balance.    LINES/TUBES: L IJ CVL 6/26 >> Foley 6/24 >> R IJ HD 7/6 >>   DISCUSSION: 56 y.o.femalewho presented after a fall at home found to be in hemorrhagic/hypovolemic with severe anemia and cardiogenic shock due to biventricular heart failure. Now with worsening anasarca and progressive renal failure with oliguria and hypotension.  CVVHD initiated 7/6.   Now w/ spont SDH w/ evidence of herniation but neuro-surg feels not clinically a concern w/ her current neuro exam   ASSESSMENT / PLAN:  NEUROLOGIC A:   Severe Deconditioning  Acute bilateral SDH L>R. Left to right shift w/ worsening uncal herniation.  Felt d/t heparin during CRRT per n-surg note. This is not common.  Being followed by n-surg  P:   - Diet as tolerated, partially passed the SLP but N/V prevented completion of test, will re-evaluate. - Keppra, maintain current dose - should remain for palliation - considering comfort care goals  PULMONARY A: Suspected OHS/OSA - intolerant of CPAP while inpatient (may have been related to fluid status) Increasing aspiration risk  pcxr personally reviewed. No sig change  P:   - pulm edema risk -prn morphine with low threshold transition to drip when RR rises or WOB up -no pcxr further  CARDIOVASCULAR A:  Acute systolic heart failure with RV overload/cor pulmonale and massive anasarca Afib with RVR,  improved Cardiogenic shock  P: - CRRT for 1 more day per renal- to off - Continue tele monitoring - Dobutamine restarted at 2.5 mcg- titrate to off over 1 hour - Amio drip- dc  RENAL A:   Acute Kidney Injury/ cardiorenal syndrome  Oliguria P:  - CRRT, d/w pt to dc  -dc labs  GASTROINTESTINAL A:   Upper GI bleed on EGD Severe protein-calorie malnutrition Hepatic steatosis Ascites > cytology negative Peritoneal mets vs uterine fibroids P:   - TPN given ileus- wil only cause harm dc - PPI , remain  HEMATOLOGIC A:   Anemia, likely multifactorial from GI bleed and chronic malnutrition hgb holding  P:  - Hold all anticoagulation  INFECTIOUS A:   Skin breakdown/wounds Cellulitis RLE Stage  II Decubitus to Bilateral Buttock, RLE UTI  Off abx. No sig fever or change in leukocytosis  P:   - Cont wound care, follow closely pain from these  ENDOCRINE A:   Mild hyperglycemia Hypothyroid P:   - dc cbg  Neuro: Comfort care decided Leg pain, dilaudid PRN, low threshold to drip  follow for RR greater 18 and WOB  Ccm time 30 min   Lavon Paganini. Titus Mould, MD, [place] Pgr: [facility]

## 2017-03-13 NOTE — Progress Notes (Signed)
  Speech Language Pathology Treatment: Dysphagia  Patient Details Name: Samantha Richards MRN: [id number] DOB: [birth year] Today's Date: 03/13/2017 Time: 4235-3614 SLP Time Calculation (min) (ACUTE ONLY): 13 min  Assessment / Plan / Recommendation Clinical Impression  Spoke with palliative medicine NP who reports that patient is now off CVVHD, transitioning to full comfort care. RN requesting that SLP observe patient with intake of patient requested regular texture solids (cantaloupe). SLP repositioned patient and gain upright posture for safest po intake. Patient able to self feed 4-5 bite of regular texture solids with no overt indication of aspiration or reports of nausea. Declined further intake following this including trials of liquid. Given transition to full comfort care, patient in agreement with resuming po intake of choice with known risk of aspiration, although based on today's presentation, does appear to be protecting airway with regular texture solids.  SLP signing off. Please re-consult if needed.    HPI HPI: 56 y.o.femalewho presented after a fall at home found to be in hemorrhagic/hypovolemic with severe anemia and cardiogenic shock due to biventricular heart failure,  worsening anasarca and progressive renal failure with oliguria and hypotension. CVVHD initiated 7/6, spontaneous SDH w/ evidence of herniation but neuro-surg feels not clinically a concern w/ her current neuro exam, acute GI bleed.        SLP Plan  Discharge SLP treatment due to (comment) (transition to full comfort)       Recommendations  Diet recommendations: Regular;Thin liquid Liquids provided via: Cup;Straw Medication Administration:  (as tolerated, may benefit from crushed in puree) Supervision: Patient able to self feed;Full supervision/cueing for compensatory strategies Compensations: Slow rate;Small sips/bites Postural Changes and/or Swallow Maneuvers: Seated upright 90 degrees                Oral Care Recommendations: Oral care BID SLP Visit Diagnosis: Dysphagia, unspecified (R13.10) Plan: Discharge SLP treatment due to (comment) (transition to full comfort)                   Gabriel [place], CCC-SLP [phone]    Luellen Howson [name] 03/13/2017, 10:22 AM

## 2017-03-13 NOTE — Progress Notes (Signed)
Received request from CSW for patient interest in [facility] with request for transfer today. Chart reviewed and report received from bedside RN. Met with patient to confirm interest and explain services. Patient agreeable to transfer today. CSW, [place] aware. Registration paperwork completed. Dr. Orpah Melter to assume care per patient request.   Please fax discharge summary to [phone].  RN please call report to [phone].  Please arrange transport for patient to arrive at [place].  Thank you,  Margaretmary Eddy, [place] [phone] Liaisons are on AMION.

## 2017-03-13 NOTE — Progress Notes (Signed)
Daily Progress Note   Patient Name: Samantha Richards       Date: 03/13/2017 DOB: [birth year]  Age: 56 y.o. MRN#: [id number] Attending Physician: Rush Farmer, MD Primary Care Physician: Patient, No Pcp Per Admit Date: 02/16/2017  Reason for Consultation/Follow-up: Establishing goals of care  Subjective: Clella is awake and alert this morning. Tearful but appropriate.   Length of Stay: 25  Current Medications: Scheduled Meds:  . chlorhexidine  15 mL Mouth Rinse BID  . Chlorhexidine Gluconate Cloth  6 each Topical Daily  . mouth rinse  15 mL Mouth Rinse q12n4p  . pantoprazole (PROTONIX) IV  40 mg Intravenous Q12H  . sodium chloride flush  10-40 mL Intracatheter Q12H    Continuous Infusions: . sodium chloride 250 mL (03/11/17 1900)  . sodium chloride    . DOBUTamine 2.5 mcg/kg/min (03/11/17 1900)  . levETIRAcetam Stopped (03/12/17 2220)    PRN Meds: sodium chloride, acetaminophen **OR** acetaminophen (TYLENOL) oral liquid 160 mg/5 mL, alum & mag hydroxide-simeth, camphor-menthol, glycopyrrolate, guaiFENesin-dextromethorphan, HYDROmorphone (DILAUDID) injection, iopamidol, lip balm, LORazepam, menthol-cetylpyridinium, ondansetron (ZOFRAN) IV, phenol, promethazine, sodium chloride flush  Physical Exam         Constitutional: She is oriented to person, place, and time. She appears well-developed. She has a sickly appearance.  Frail, severely deconditioned  Cardiovascular: Normal rate.  An irregularly irregular rhythm present.  Pulmonary/Chest: Effort normal. No accessory muscle usage. No tachypnea. No respiratory distress. She has rales in the right lower field and the left lower field.  Abdominal: Soft. She exhibits ascites.  Neurological: She is alert and oriented to person, place, and  time.  Psychiatric: She has a normal mood and affect. Her speech is normal and behavior is normal. Judgment and thought content normal.  Occasional delay in word searching but rare in conversation. Appropriately tearful for our conversation.   Nursing note and vitals reviewed.   Vital Signs: BP 90/60   Pulse 90   Temp 98.6 F (37 C) (Oral)   Resp (!) 23   Ht 5' 5"  (1.651 m)   Wt 66.9 kg (147 lb 7.8 oz)   LMP 01/16/2017 Comment: neg preg 02-16-2017  SpO2 99%   BMI 24.54 kg/m  SpO2: SpO2: 99 % O2 Device: O2 Device: Not Delivered O2 Flow Rate: O2 Flow Rate (L/min): 2 L/min  Intake/output  summary:  Intake/Output Summary (Last 24 hours) at 03/13/17 0951 Last data filed at 03/13/17 0900  Gross per 24 hour  Intake           3695.4 ml  Output             6338 ml  Net          -2642.6 ml   LBM: Last BM Date: 03/08/17 Baseline Weight: Weight: 107.8 kg (237 lb 10.5 oz) Most recent weight: Weight: 66.9 kg (147 lb 7.8 oz)       Palliative Assessment/Data: 20%      Patient Active Problem List   Diagnosis Date Noted  . Goals of care, counseling/discussion   . Palliative care encounter   . Leukocytosis   . AKI (acute kidney injury) ([place])   . Cardiogenic shock ([place]) 02/21/2017  . Severe protein-calorie malnutrition ([place]) 02/21/2017  . Hypothyroidism 02/21/2017  . Encounter for central line placement   . Acute on chronic combined systolic and diastolic CHF (congestive heart failure) ([place])   . Atypical atrial flutter ([place])   . Acute blood loss anemia   . Ascites   . Generalized weakness   . Symptomatic anemia   . GI bleed 02/16/2017  . Pressure injury of skin 02/16/2017    Palliative Care Assessment & Plan   HPI: 56 y.o. female  with past medical history of obesity but no other history as she has not been to any medical providers admitted on 02/16/2017 with severe anemia and dehydration with weakness/pain after lying on floor for 3 days. Hospitalization has been complicated  by cellulitis of back, GIB with continued anemia, AF RVR requiring amiodarone gtt, biventricular heart failure and cardiogenic shock requiring dobutamine gtt (off vasopressors and Lasix gtt currently), followed by renal failure with CRRT since 02/28/17. Severely deconditioned and not improving/progressing so palliative care requested for assistance with GOC in setting of poor prognosis.   Assessment: I met again today with Lattie Haw. She has already had conversations with renal and heart failure. She understands now that her time is limited and that we cannot help her to improve anymore. She understands that she cannot be kept alive on machines, nor does she wish for that. She feels like she has reached the place of acceptance that she will die soon and "I'm ready to meet Jesus." She references her faith which brings her strength.    She confirms her desire for comfort and tells me that her main goal is that she does not want to die in a hospital. She brings up going home but I fear she would not have the care she needs and I strongly recommended hospice facility and she agrees this would be a good option for her. We discussed comfort feeds and she would like chicken nuggets, cantaloupe, and plums - she laughs as she tells me this. I explained that any little thing we can help to bring her pleasure in these days to let us know. Emotional support provided. CSW asked to help with hospice placement.   Recommendations/Plan:  Leg pain is much improved today.  Pain/dyspnea: Dilaudid 0.5 mg every hour prn. Suprisingly comfortable and breathing well at this time but this could change rapidly at any point in time. May need opioid infusion.   Nausea: Continue prn Zofran with prn phenergan if Zofran ineffective.   Malnutrition: Comfort feeds.   Stop TPN as to limit fluid intake for comfort.   Goals of Care and Additional Recommendations:  Limitations on Scope  of Treatment: Full Comfort Care  Code  Status:  DNR  Prognosis:   Likely days.   Discharge Planning:  Hospice facility   Thank you for allowing the Palliative Medicine Team to assist in the care of this patient.   Total Time 72mn Prolonged Time Billed  no       Greater than 50%  of this time was spent counseling and coordinating care related to the above assessment and plan.  AVinie Sill NP Palliative Medicine Team Pager # [phone](M-F 8a-5p) Team Phone # [phone](Nights/Weekends)

## 2017-03-13 NOTE — Progress Notes (Signed)
Advanced Heart Failure Rounding Note  PCP:  Primary Cardiologist: Dr Acie Fredrickson   Subjective:    02/26/17 started on milrinone and placed on lasix drip.   Evening of 02/27/17 pts pressures fell requiring more dopamine, became less responsive and UOP worsening. Pt transferred to [place].   CVVHD started 7/6   On 7/14 developed severe HA. CT scan notable for SDH with mass effect. Heparin stopped. Seen by [facility] who recommended conservative management.  On 7/15 developed some aphasia and had repeat CT which showed mild increase in midline shift. Continue medical management  Coox 58.0 on dobutamine 2.5 mcg/kg/min.   CVVHD stopped this am by patient request.  She also has verbalized she would not want a breathing tube.   Feeling tired this am. SOB somewhat improved.  Her legs have not hurt since last night. Pt does not want to be intubated, nor does she want chest compressions.  She has requested transition to full comfort care, which we will honor.   WBC 19.9 this am, which is an improvement.  Dr. Beryle Beams thinks possible from meningeal irritation  Out 3.2 L and down another 9 lbs. CVP 14-15  CK and Aldolase drawn yesterday to r/u myositis with leg pain. CK 9. Aldolase pending.   Objective:   Weight Range: 147 lb 7.8 oz (66.9 kg) Body mass index is 24.54 kg/m.   Vital Signs:   Temp:  [97.5 F (36.4 C)-99.2 F (37.3 C)] 98.6 F (37 C) (07/19 0800) Pulse Rate:  [81-102] 93 (07/19 0800) Resp:  [16-25] 21 (07/19 0800) BP: (78-96)/(53-70) 81/61 (07/19 0800) SpO2:  [94 %-100 %] 99 % (07/19 0800) Weight:  [147 lb 7.8 oz (66.9 kg)] 147 lb 7.8 oz (66.9 kg) (07/19 0700) Last BM Date: 03/08/17  Weight change: Filed Weights   03/11/17 0400 03/12/17 0705 03/13/17 0700  Weight: 167 lb 12.3 oz (76.1 kg) 156 lb 12 oz (71.1 kg) 147 lb 7.8 oz (66.9 kg)    Intake/Output:   Intake/Output Summary (Last 24 hours) at 03/13/17 0818 Last data filed at 03/13/17 0600  Gross per 24 hour  Intake            3396.2 ml  Output             6713 ml  Net          -3316.8 ml     Physical Exam   CVP 14-15 General: Chronically ill appearing. NAD.  HEENT: Normal Neck: Supple. RIJ trialysis cath. LIJ TLC. Carotids 2+ bilat; no bruits. No thyromegaly or nodule noted. Cor: PMI nondisplaced. Tachy. IRregular.  Lungs: Diminished basilar sounds.  Abdomen: Soft, non-tender, non-distended, no HSM. No bruits or masses. +BS  Extremities: No cyanosis, clubbing, or rash. Trace to 1+ edema. No tenderness.   Neuro: Alert & orientedx3, cranial nerves grossly intact. moves all 4 extremities w/o difficulty. Affect flat   Telemetry   Personally reviewed, A flutter 80-90s  EKG   N/A  Labs    CBC  Recent Labs  03/12/17 0413 03/13/17 0448  WBC 26.3* 19.9*  HGB 7.9* 7.5*  HCT 25.6* 24.9*  MCV 79.8 79.6  PLT 162 376   Basic Metabolic Panel  Recent Labs  03/12/17 0413 03/12/17 1600 03/13/17 0448  NA 130* 131* 132*  K 3.7 3.7 4.2  CL 99* 99* 100*  CO2 25 25 26   GLUCOSE 147* 140* 143*  BUN 24* 27* 30*  CREATININE 1.12* 1.15* 1.26*  CALCIUM 8.9 8.9 8.7*  MG 2.1  --  2.2  PHOS 1.6* 2.9 2.7   Liver Function Tests  Recent Labs  03/12/17 1600 03/13/17 0448  AST  --  14*  ALT  --  12*  ALKPHOS  --  107  BILITOT  --  1.0  PROT  --  5.6*  ALBUMIN 2.2* 2.2*     BNP (last 3 results)  Recent Labs  02/16/17 1516  BNP 923.6*       Imaging   Transthoracic Echocardiography Study Conclusions  - Left ventricle: Septal flattening consistant with elevated RV   pressures. Systolic function was moderately to severely reduced.   The estimated ejection fraction was in the range of 30% to 35%.   Diffuse hypokinesis. - Mitral valve: There was mild regurgitation. - Left atrium: The atrium was mildly dilated. - Right ventricle: The cavity size was moderately dilated. - Right atrium: The atrium was moderately dilated. - Atrial septum: No defect or patent foramen ovale was  identified. - Tricuspid valve: There was moderate-severe regurgitation. - Pericardium, extracardiac: A trivial pericardial effusion was   identified. - Impressions: Suspect PA pressure underestimated by TR velocity   due to RV dysfunction. There is moderate RV enlargement with   severe hypokinesis and signs of significant cor pulmonale.  Impressions:  - Suspect PA pressure underestimated by TR velocity due to RV   dysfunction. There is moderate RV enlargement with severe   hypokinesis and signs of significant cor pulmonale.     Medications:     Scheduled Medications: . chlorhexidine  15 mL Mouth Rinse BID  . Chlorhexidine Gluconate Cloth  6 each Topical Daily  . feeding supplement (ENSURE ENLIVE)  237 mL Oral BID BM  . feeding supplement (PRO-STAT SUGAR FREE 64)  60 mL Oral BID  . fluticasone  2 spray Each Nare Daily  . insulin aspart  0-15 Units Subcutaneous Q4H  . levothyroxine  50 mcg Intravenous Daily  . mouth rinse  15 mL Mouth Rinse q12n4p  . pantoprazole (PROTONIX) IV  40 mg Intravenous Q12H  . senna-docusate  1 tablet Oral BID  . sodium chloride flush  10-40 mL Intracatheter Q12H  . traMADol  50 mg Oral Q12H    Infusions: . Marland KitchenTPN (CLINIMIX-E) Adult 110 mL/hr at 03/12/17 1718  . sodium chloride 250 mL (03/11/17 1900)  . sodium chloride    . amiodarone 30 mg/hr (03/13/17 0002)  . DOBUTamine 2.5 mcg/kg/min (03/11/17 1900)  . levETIRAcetam Stopped (03/12/17 2220)  . dialysis replacement fluid (prismasate) 500 mL/hr at 03/13/17 0322  . dialysis replacement fluid (prismasate) 300 mL/hr at 03/12/17 0627  . dialysate (PRISMASATE) 1,000 mL/hr at 03/13/17 0612    PRN Medications: sodium chloride, acetaminophen **OR** acetaminophen (TYLENOL) oral liquid 160 mg/5 mL, alum & mag hydroxide-simeth, camphor-menthol, guaiFENesin-dextromethorphan, heparin, HYDROmorphone (DILAUDID) injection, hydrOXYzine, iopamidol, lip balm, menthol-cetylpyridinium, ondansetron (ZOFRAN) IV,  phenol, promethazine, promethazine, sodium chloride flush    Patient Profile   56 y/o woman with morbid obesity and little previous medical follow-up. Admitted 6/24 with severe anemia (hgb 3.7) and cardiogenic shock due biventricular HF R>L. Echo reviewed EF 30-35% with severe RV Failure and PAH. Has anasarca and progressive renal failure with inability to mobilize fluid. Transferred to [facility] on 7/4 for further evaluation by CHF team.   Assessment/Plan   1. 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  Low volts on ECG concerning for amyloid. Creatinine too high at baseline for cMRI.  - Consider TPY scan when more stable.  - Milrinone stopped 03/09/17. Off dopamine. Coox 58% this am on dobutamine 2.5 mcg. Pt has asked for transition to comfort care. Will stop today.   - CVP remains 14-15. Weight was trending down on CVVHD. Pt has requested cessation, as she would not be a candidate for iHD moving forward.  - No beta blocker with acute decompensation.  - No further work up for biventricular CM with transition to comfort care.  - Suspect severe malnutrition may be key factor in her entire presentation. No change. .   2. Acute renal failure.  - CVVHD stopped this am by patient request. Moving to full comfort care. Weight down > 100 lbs this admission.  - CVP remains elevated.  Appreciate renal input. No candidate for iHD with Biventricular failure.   4. Intracranial hemorrhage with SDH - developed on 7/13 - Heparin stopped. CTs reviewed personally. ? Some mild progression overnight radiographically - Neuro exam stable this am. Not agitated. No HAs.   - Dr. Haroldine Laws spoke with [facility] personally 03/09/17 and will continue conservative management given lack of neuro symptoms and location of bleed (which leads to  upward pressure on brain and lower risk of herniation) - All heparin on hold - Continue frequent neuro checks - Repeat imaging per Neuro as needed. Likely no further work up with comfort care.    5. Iron deficiency anemia - Hgb on admit was 3.7. With low MCV. Iron stores low. - EGD 02/20/17 with severe esophagitis  - Received feraheme 02/26/2017.  - Hgb 7.5 this am.  - No further work up with transition to comfort care.   6. Atrial flutter - Rates relatively controlled. Continue amio.   - Off heparin due to SDH so not candidate for DC-CV or possible ablation. No change.   7. Anasarca/ascites in setting of severe protein calorie malnutition - CT abdomen with hepatic stenosis.  - Likely due to combination of RHF and fatty liver. Albumin 2.7 02/18/17 - prealbumin was < 5 on 02/18/17. - Failed tube feeds. Intake remains poor. TPN started 7/14. No change.   8. Leukocytosis - Persistently elevated in 20-25K range. Yesterday up to 32k. 29K today. Has completed rocephin for a UTI.  - No obvious sites of infection.  PCT only 1.44 02/27/17.  - Seen by Dr. Beryle Beams on 7/9 and 03/11/17. Smear ok. No evidence of toxic process or malignancy at this point.  - No further work up with transition to comfort care.    9. Severe fibroid uterus  10. Morbid obesity - Nutrition following .  No change.   11. RLE wound - WOC has seen. Wounds improving. Heme does not think these are source of WBC.  No change. Continue to treat for comfort.   12. Probable OSA/OHS - Bipap as needed. No change.    13-MASD (Moisture Associated Skin Damage) Buttock Wounds- Partial thickness- dose not appear infected. Appears to be related to moisture and fecal incontinence.  Prealbumin <5.  Wound care appreciated. Continue to manage for comfort.   Pt has requested cessation of CVVHD and transition to comfort. Per Renal, suspect this to be fatal within several days.   Will have palliative care see as soon as possible this  am to aid in transition.    Length of Stay: [place], [place]  03/13/2017, 8:18 AM  Advanced Heart  Failure Team Pager [phone] (M-F; 7a - 4p)  Please contact [facility] for night-coverage after hours (4p -7a ) and weekends on [email or web address]  Patient seen with PA, agree with the above note.    Her CVVH filter clotted off this morning, and she has decided that she does not want to restart CVVH given little improvement with CVVH x 2 wks and unlikely that she will tolerate iHD.  She is interested in comfort care/hospice.  I discussed this at length with the patient, Dr. Joelyn Oms with renal, and the palliative care service.  We will transition towards comfort care.  This will involve weaning off her drips.  Palliative care service to see this am.   Loralie Champagne 03/13/2017 9:17 AM

## 2017-03-14 LAB — ALDOLASE: Aldolase: 5.9 U/L (ref 3.3–10.3)

## 2017-04-04 LAB — ACID FAST CULTURE WITH REFLEXED SENSITIVITIES (MYCOBACTERIA): Acid Fast Culture: NEGATIVE

## 2018-03-25 IMAGING — DX DG CHEST 1V PORT
1 series · 1 of 1 positions shown · non-contrast
Comparison: Three days ago

CLINICAL DATA: CHF with weakness

EXAM:
PORTABLE CHEST 1 VIEW

[chest]
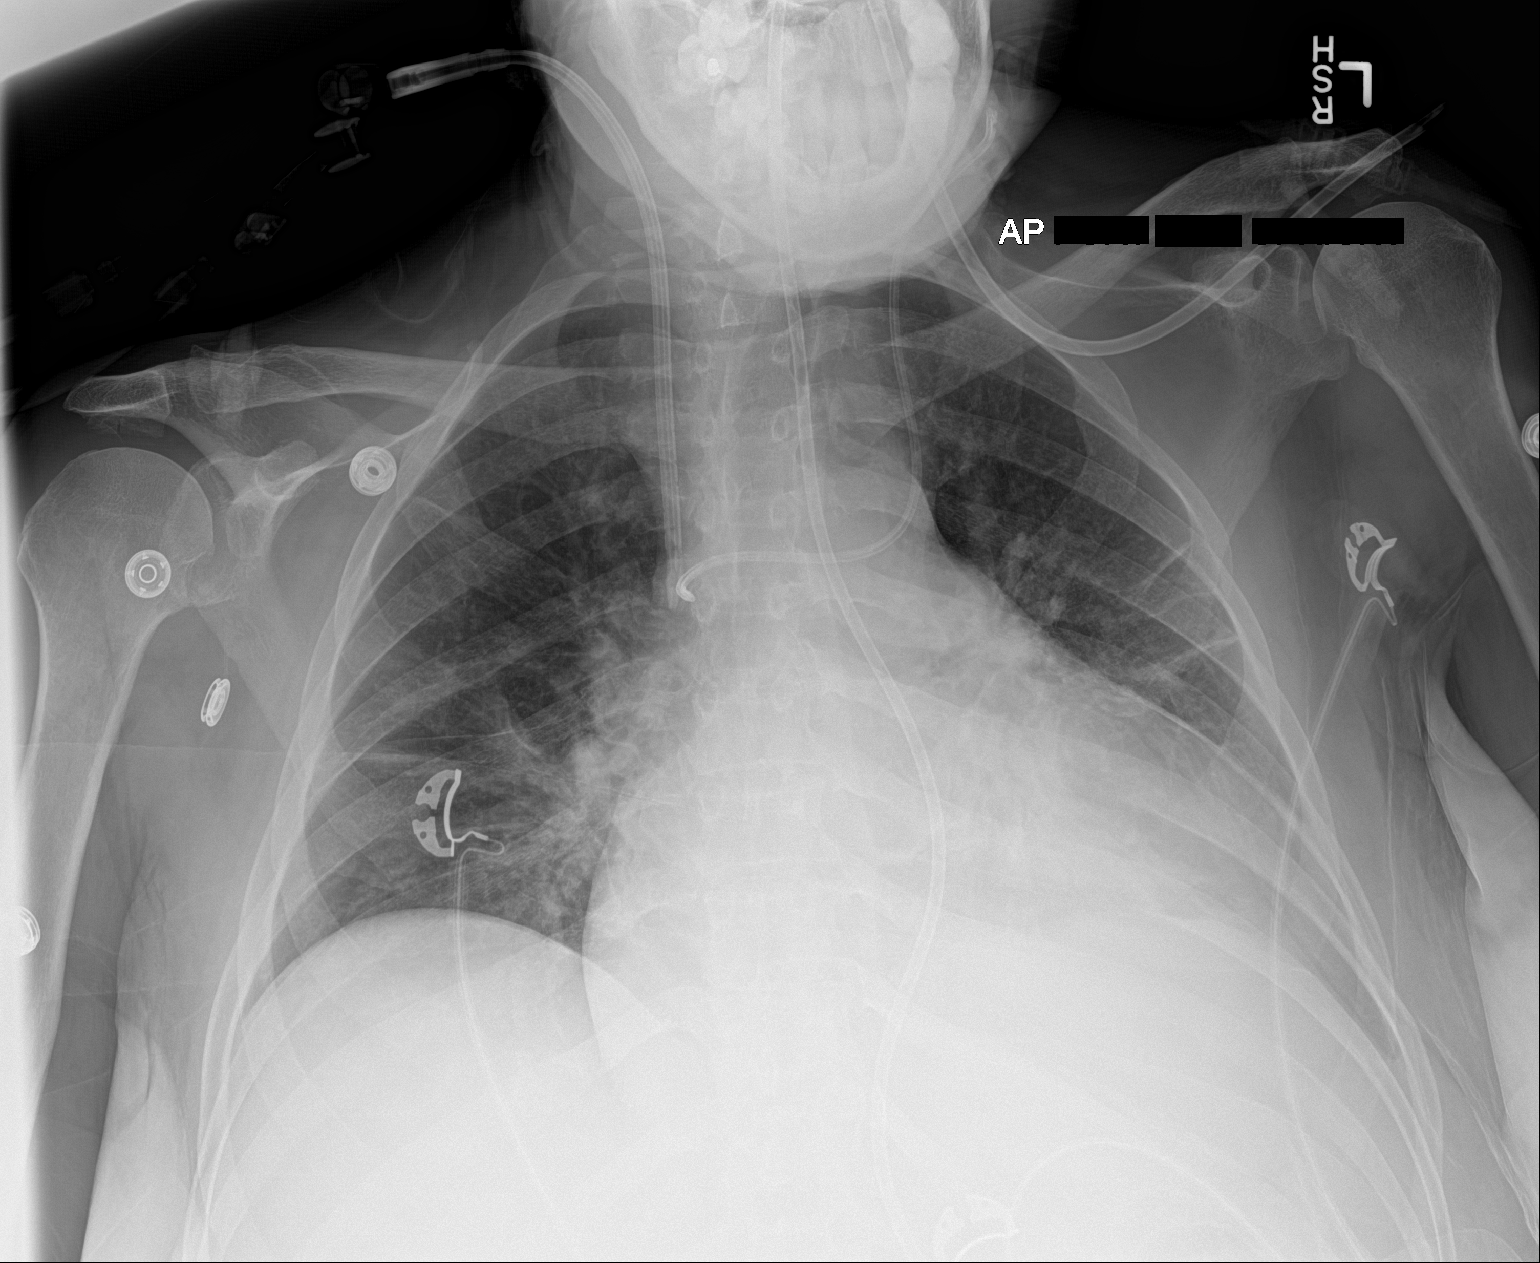

[1 of 1 positions shown; findings below may reference images not displayed]

FINDINGS: Stable cardiopericardial enlargement. Linear opacities in the
perihilar and retrocardiac lungs attributed atelectasis. No Kerley
lines for edema. No effusion or pneumothorax.

Feeding tube has been placed, at least reaching the stomach. Right
IJ central line with tip at the SVC. Left IJ central line
cannulating the azygos.
IMPRESSION: 1. Cardiomegaly without failure.
2. Low volume chest with atelectasis.

## 2018-03-26 IMAGING — CT CT HEAD W/O CM
4 series · 15 of 47 positions shown, 17 images · non-contrast
Comparison: None.

ADDENDUM:
Critical Value/emergent results were called by telephone at the time
of interpretation on 03/08/2017 at [DATE] to Dr. ALDO , who
verbally acknowledged these results.
CLINICAL DATA: Nausea and vomiting. Severe headache for 2 days.
Patient has been on anticoagulation. Severe anemia.

EXAM:
CT HEAD WITHOUT CONTRAST
TECHNIQUE: Contiguous axial images were obtained from the base of the skull
through the vertex without intravenous contrast.

[Series 3: head without · axial · non-contrast · 0.41mm/px · z∈[-143,-28]mm · 7 of 31 slices shown, 9 images]
[im 4/31  brain]
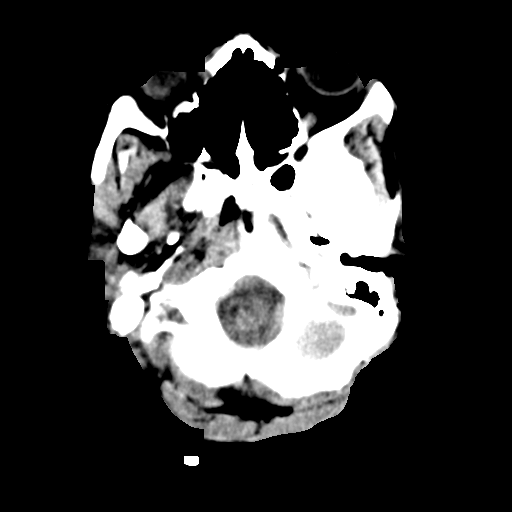
[im 4/31  bone]
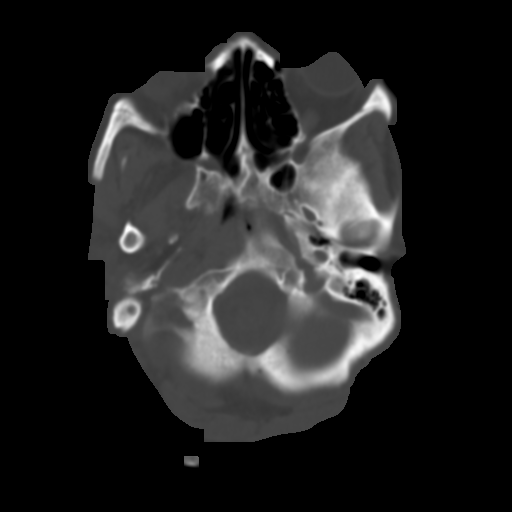
[im 8/31  brain]
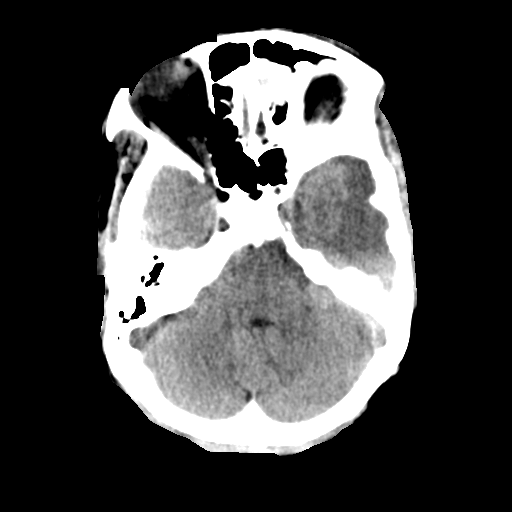
[im 12/31  brain]
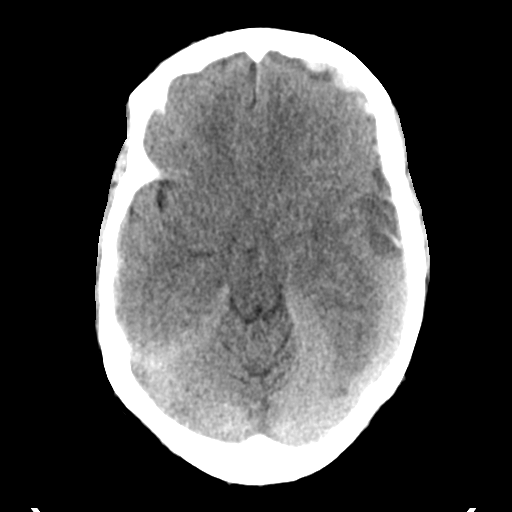
[im 16/31  brain]
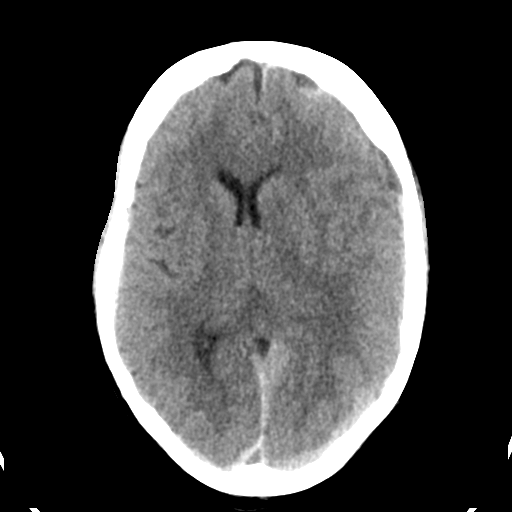
[im 19/31  brain]
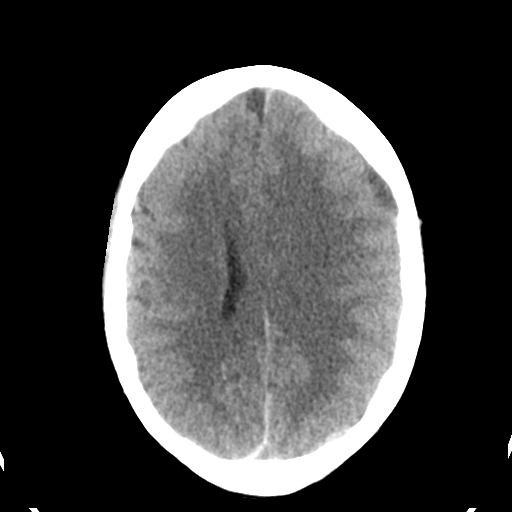
[im 19/31  bone]
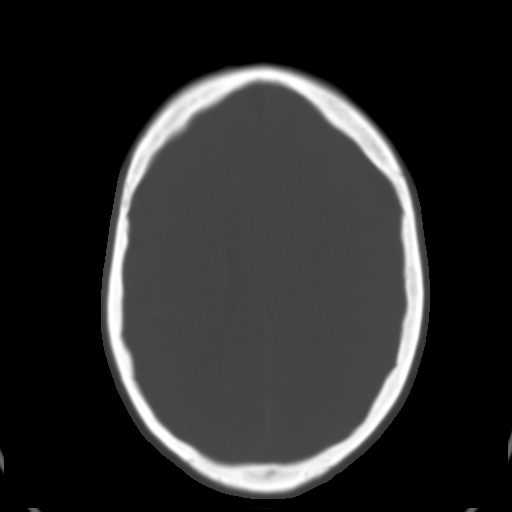
[im 23/31  brain]
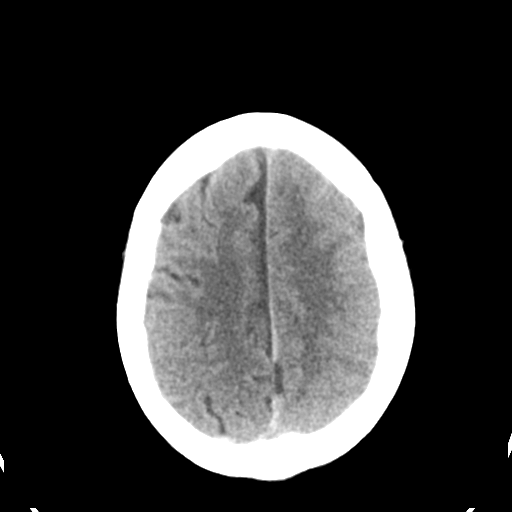
[im 27/31  brain]
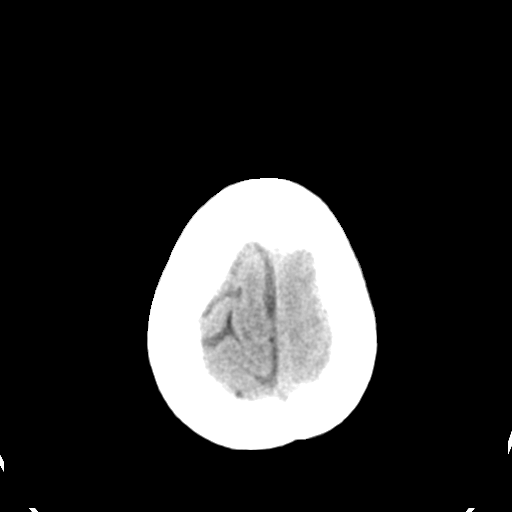

[Series 4: head bone · axial · 0.41mm/px · z∈[-144,-128]mm · 2 of 77 slices shown]
[im 8/77  bone]
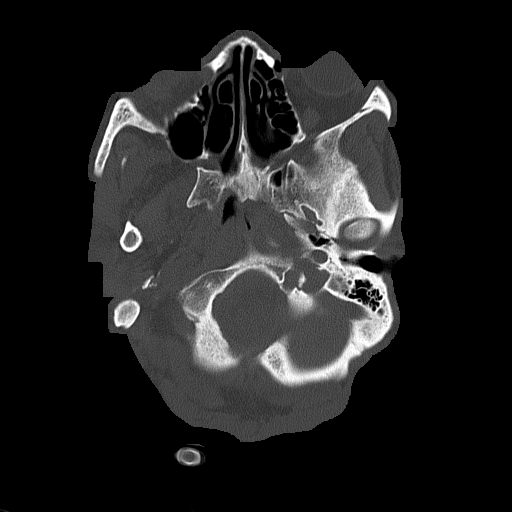
[im 16/77  bone]
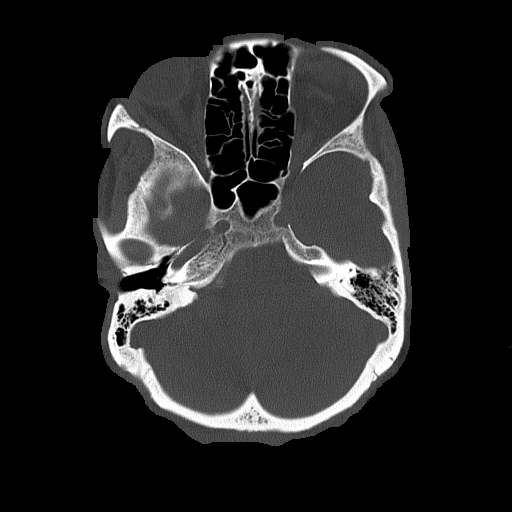

[Series 5: head without cor · coronal · non-contrast · 0.30mm/px · 3 of 67 slices shown]
[im 23/67  brain]
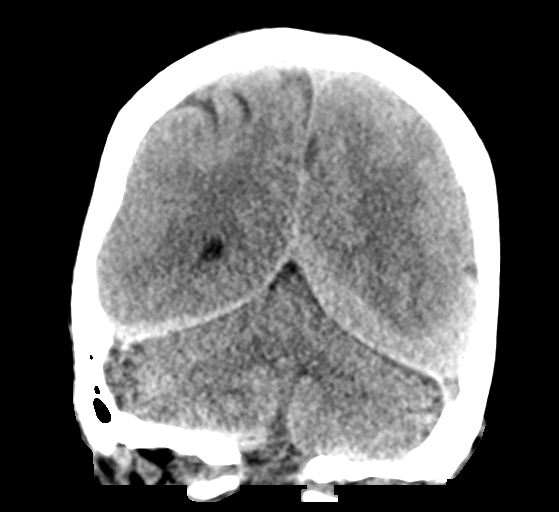
[im 30/67  brain]
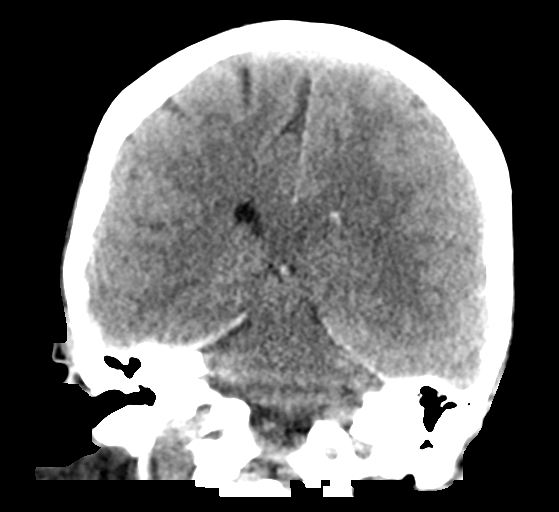
[im 37/67  brain]
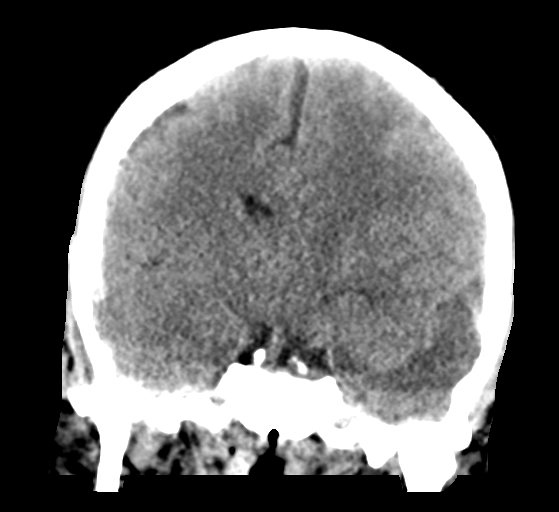

[Series 6: head without sag · sagittal · non-contrast · 0.30mm/px · 3 of 67 slices shown]
[im 23/67  brain]
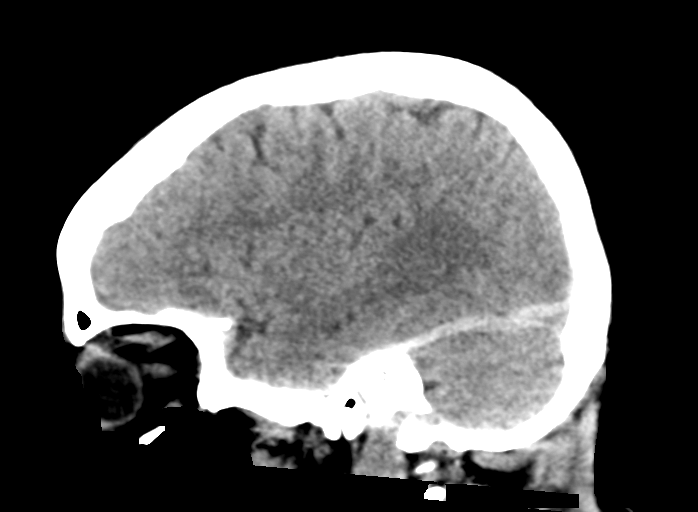
[im 34/67  brain]
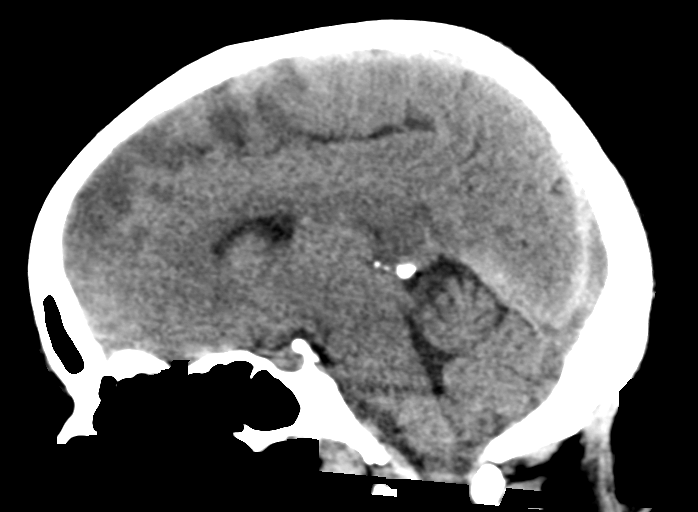
[im 45/67  brain]
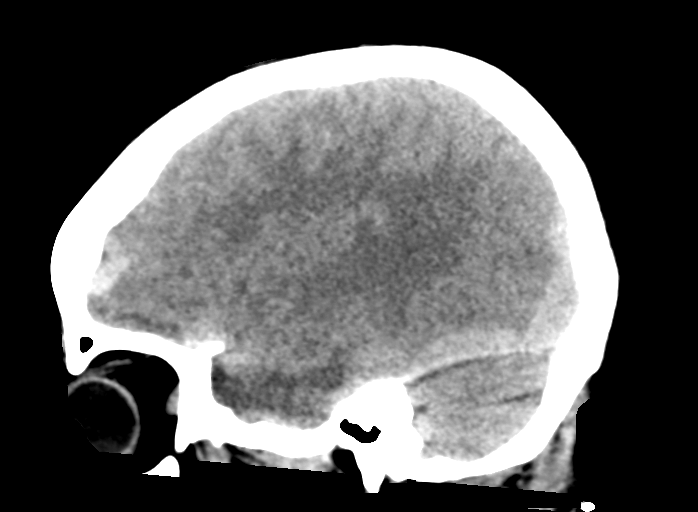

[15 of 47 positions shown; findings below may reference images not displayed]

FINDINGS: Brain: Large acute subdural hematoma on the LEFT, with the largest
collection subtemporal. This is up to 15 mm thick. Extra-axial
hematoma extends over the convexity, from the frontal, through the
parietal and occipital regions. The collection extends along the
tentorium posteriorly on the LEFT, and/or lesser degree on the
RIGHT. Some areas are hyperdense and others are hypodense. Mixed
attenuation could represent cell/serum separation in this patient
with severe anemia. Much smaller collection of subdural fluid on the
RIGHT, 3 mm as seen in the frontal interhemispheric fissure, but
hyperdense along the RIGHT tentorium, up to 5 mm thick.

There is LEFT-to-RIGHT shift of 7 mm as measured at the septum
pellucidum. Early uncal herniation is also present, with effacement
of the suprasellar cistern is slight brainstem rotation.

Normal cerebral volume. No white matter disease. Gray-white junction
is preserved throughout. No intra-axial hemorrhage, mass lesion, or
visible acute stroke.

Vascular: No hyperdense vessel or unexpected calcification.

Skull: Normal. Negative for fracture or focal lesion.

Sinuses/Orbits: No acute finding.

Other: None.
IMPRESSION: BILATERAL subdural collections, up to 15 mm thick, greatest in the
LEFT middle cranial fossa. In this patient with severe headaches and
history of anticoagulation, the findings are consistent with
spontaneous LEFT much greater than RIGHT BILATERAL acute subdural
hematomas.

Up to 7 mm LEFT-to-RIGHT shift is measured at the septum pellucidum.
Early uncal herniation is observed. Neurosurgical consultation is
warranted.

Mixed attenuation of the subdural collections, some areas hyperdense
and others hypodense, likely represents cell/serum separation in
this patient with severe anemia.

A call has been placed to the ordering provider.

## 2020-08-22 ENCOUNTER — Telehealth: Payer: Self-pay

## 2020-08-22 NOTE — Telephone Encounter (Signed)
*  US Abdomen Limited RUQ
